# Patient Record
Sex: Female | Born: 1950 | Race: Black or African American | Hispanic: No | Smoking: Former smoker
Health system: Southern US, Community
[De-identification: ages and names within clinical notes are randomized; demographics above are authoritative.]

## PROBLEM LIST (undated history)

## (undated) ENCOUNTER — Emergency Department (HOSPITAL_COMMUNITY): Admission: EM | Payer: BC Managed Care – PPO | Source: Home / Self Care

## (undated) DIAGNOSIS — E785 Hyperlipidemia, unspecified: Secondary | ICD-10-CM

## (undated) DIAGNOSIS — F17200 Nicotine dependence, unspecified, uncomplicated: Secondary | ICD-10-CM

## (undated) DIAGNOSIS — E663 Overweight: Secondary | ICD-10-CM

## (undated) HISTORY — DX: Overweight: E66.3

## (undated) HISTORY — DX: Nicotine dependence, unspecified, uncomplicated: F17.200

---

## 1985-02-16 HISTORY — PX: PARTIAL HYSTERECTOMY: SHX80

## 2008-02-04 ENCOUNTER — Emergency Department (HOSPITAL_COMMUNITY): Admission: EM | Admit: 2008-02-04 | Discharge: 2008-02-04 | Payer: Self-pay | Admitting: Emergency Medicine

## 2009-04-22 ENCOUNTER — Ambulatory Visit: Payer: Self-pay | Admitting: Family Medicine

## 2009-04-22 DIAGNOSIS — J309 Allergic rhinitis, unspecified: Secondary | ICD-10-CM | POA: Insufficient documentation

## 2009-04-22 DIAGNOSIS — R5383 Other fatigue: Secondary | ICD-10-CM

## 2009-04-22 DIAGNOSIS — E663 Overweight: Secondary | ICD-10-CM | POA: Insufficient documentation

## 2009-04-22 DIAGNOSIS — Z87891 Personal history of nicotine dependence: Secondary | ICD-10-CM | POA: Insufficient documentation

## 2009-04-22 DIAGNOSIS — E66811 Obesity, class 1: Secondary | ICD-10-CM | POA: Insufficient documentation

## 2009-04-22 DIAGNOSIS — R5381 Other malaise: Secondary | ICD-10-CM | POA: Insufficient documentation

## 2009-07-23 ENCOUNTER — Encounter: Payer: Self-pay | Admitting: Family Medicine

## 2009-08-01 ENCOUNTER — Telehealth: Payer: Self-pay | Admitting: Family Medicine

## 2009-08-01 LAB — CONVERTED CEMR LAB
Basophils Absolute: 0 10*3/uL (ref 0.0–0.1)
Basophils Relative: 1 % (ref 0–1)
Calcium: 9.7 mg/dL (ref 8.4–10.5)
Cholesterol: 288 mg/dL — ABNORMAL HIGH (ref 0–200)
Glucose, Bld: 90 mg/dL (ref 70–99)
HDL: 47 mg/dL (ref >39)
MCHC: 32.1 g/dL (ref 30.0–36.0)
Neutro Abs: 2.9 10*3/uL (ref 1.7–7.7)
Neutrophils Relative %: 38 % — ABNORMAL LOW (ref 43–77)
Platelets: 334 10*3/uL (ref 150–400)
RDW: 15.4 % (ref 11.5–15.5)
Sodium: 139 meq/L (ref 135–145)
Total CHOL/HDL Ratio: 6.1
Triglycerides: 108 mg/dL (ref <150)
Vit D, 25-Hydroxy: 46 ng/mL (ref 30–89)

## 2009-08-05 ENCOUNTER — Ambulatory Visit: Payer: Self-pay | Admitting: Family Medicine

## 2009-08-05 DIAGNOSIS — E785 Hyperlipidemia, unspecified: Secondary | ICD-10-CM | POA: Insufficient documentation

## 2009-12-05 ENCOUNTER — Ambulatory Visit: Payer: Self-pay | Admitting: Family Medicine

## 2009-12-05 DIAGNOSIS — T7840XA Allergy, unspecified, initial encounter: Secondary | ICD-10-CM | POA: Insufficient documentation

## 2009-12-09 LAB — CONVERTED CEMR LAB
ALT: 30 units/L (ref 0–35)
AST: 29 units/L (ref 0–37)
Albumin: 4 g/dL (ref 3.5–5.2)
HDL: 42 mg/dL (ref >39)
Total Bilirubin: 0.4 mg/dL (ref 0.3–1.2)
Total CHOL/HDL Ratio: 3.4

## 2010-03-18 NOTE — Assessment & Plan Note (Signed)
Summary: office visit   Vital Signs:  Patient profile:   60 year old female Menstrual status:  hysterectomy Height:      65.5 inches Weight:      166.50 pounds BMI:     27.38 O2 Sat:      97 % on Room air Pulse rate:   70 / minute Pulse rhythm:   regular Resp:     16 per minute BP sitting:   122 / 82  (left arm)  Vitals Entered By: Mauricia Area CMA (December 05, 2009 3:25 PM)  Nutrition Counseling: Patient's BMI is greater than 25 and therefore counseled on weight management options.  O2 Flow:  Room air CC: follow up   Primary Care Provider:  Syliva Overman MD  CC:  follow up.  History of Present Illness: 5 day h/o increased head and chest congestion, she denies fever or chills, drainage and sputum are clear. ms [place] still smokes daily and has mde essentially no progress in quitting. pt reports some muscle aches on the lipitor, neither constant nor seere. Reports  that she had been  doing well. Denies recent fever or chills. She has been wporking on dietarychange to improve her cholesterol. Denies chest pain, palpitations, PND, orthopnea or leg swelling. Denies abdominal pain, nausea, vomitting, diarrhea or constipation. Denies change in bowel movements or bloody stool. Denies dysuria , frequency, incontinence or hesitancy. Denies  joint pain, swelling, or reduced mobility. Denies headaches, vertigo, seizures. Denies depression, anxiety or insomnia. Denies  rash, lesions, or itch.     Preventive Screening-Counseling & Management  Alcohol-Tobacco     Smoking Cessation Counseling: yes  Current Medications (verified): 1)  Lipitor 40 Mg Tabs (Atorvastatin Calcium) .... Take 1 Tab By Mouth At Bedtime 2)  Evista 60 Mg Tabs (Raloxifene Hcl) .Marland Kitchen.. 1 Tab By Mouth Daily  Allergies (verified): 1)  ! * Boniva  Review of Systems      See HPI General:  Complains of fatigue and malaise; 5 day hx. Eyes:  Denies blurring and discharge. CV:  Denies chest pain or  discomfort, palpitations, and swelling of feet. GI:  Denies abdominal pain, constipation, diarrhea, nausea, and vomiting. GU:  Denies dysuria and urinary frequency. MS:  Denies joint pain and stiffness. Psych:  Denies anxiety and depression. Endo:  Denies cold intolerance, excessive thirst, excessive urination, and heat intolerance. Heme:  Denies abnormal bruising and bleeding. Allergy:  Complains of seasonal allergies and sneezing; denies hives or rash and itching eyes.  Physical Exam  General:  Well-developed,well-nourished,in no acute distress; alert,appropriate and cooperative throughout examination HEENT: No facial asymmetry,  EOMI, No sinus tenderness, TM's Clear, oropharynx  pink and moist. nasal mucosa erythematous nd edmatous  Chest: decreased aior ntry, few scattered crackles CVS: S1, S2, No murmurs, No S3.   Abd: Soft, Nontender.  MS: Adequate ROM spine, hips, shoulders and knees.  Ext: No edema.   CNS: CN 2-12 intact, power tone and sensation normal throughout.   Skin: Intact, no visible lesions or rashes.  Psych: Good eye contact, normal affect.  Memory intact, not anxious or depressed appearing.    Impression & Recommendations:  Problem # 1:  ALLERGY (ICD-995.3) Assessment Deteriorated prednisone dose pack  Problem # 2:  HYPERLIPIDEMIA (ICD-272.4) Assessment: Improved  The following medications were removed from the medication list:    Lipitor 40 Mg Tabs (Atorvastatin calcium) .Marland Kitchen... Take 1 tab by mouth at bedtime  Orders: T-Lipid Profile [phone]) T-Hepatic Function [phone]) T-Hepatic Function [phone]) T-Lipid Profile [phone])  Problem # 3:  NICOTINE ADDICTION (ICD-305.1) Assessment: Unchanged  Encouraged smoking cessation and discussed different methods for smoking cessation.   Problem # 4:  OVERWEIGHT (ICD-278.02) Assessment: Improved  Ht: 65.5 (12/05/2009)   Wt: 166.50 (12/05/2009)   BMI: 27.38 (12/05/2009)  Complete Medication  List: 1)  Evista 60 Mg Tabs (Raloxifene hcl) .Marland Kitchen.. 1 tab by mouth daily  Patient Instructions: 1)  Please schedule a follow-up appointment in 4 months. 2)  Tobacco is very bad for your health and your loved ones! You Should stop smoking!. 3)  Stop Smoking Tips: Choose a Quit date. Cut down before the Quit date. decide what you will do as a substitute when you feel the urge to smoke(gum,toothpick,exercise).i recommend smoking cessation classes 4)  It is important that you exercise regularly at least 20 minutes 5 times a week. If you develop chest pain, have severe difficulty breathing, or feel very tired , stop exercising immediately and seek medical attention. 5)  You need to lose weight. Consider a lower calorie diet and regular exercise.  6)  Lipid Panel prior to visit, ICD-9:fasting today, hepatic and CPK today 7)  pLS sTOP the lipitor, i will recommend another med in its place 8)  You are being treated for uncontrolled allergies, prednisone dose pack is sent in 9)  pls call back in the next 7 to 10 days if you get worse with fever, chills, green sputum or green drainage, at that time i will prescribe antibiotics Prescriptions: PREDNISONE (PAK) 5 MG TABS (PREDNISONE) Use as directed  #21 x 0   Entered and Authorized by:   Syliva Overman MD   Signed by:   Syliva Overman MD on 12/05/2009   Method used:   Printed then faxed to ...         RxID:   [id number]    Orders Added: 1)  Est. Patient Level IV [42595] 2)  T-Lipid Profile [[id number]] 3)  T-Hepatic Function [[id number]] 4)  T-Hepatic Function [[id number]] 5)  T-Lipid Profile [[id number]]

## 2010-03-18 NOTE — Progress Notes (Signed)
Summary: CALL BACK  Phone Note Call from Patient   Summary of Call: WANTS YOU TO CALL HER BACK SHE IS WORIED TO DEATH WHY YOU WANT TO SEE HER CALL BACK AT [phone] Initial call taken by: Lind Guest,  August 01, 2009 2:45 PM  Follow-up for Phone Call        Advise pt it is to discuss her cholesterol. Follow-up by: Esperanza Sheets PA,  August 01, 2009 5:05 PM  Additional Follow-up for Phone Call Additional follow up Details #1::        Patient aware Additional Follow-up by: Everitt Amber LPN,  August 02, 2009 4:01 PM

## 2010-03-18 NOTE — Assessment & Plan Note (Signed)
Summary: PER DR LABS   Vital Signs:  Patient profile:   60 year old female Menstrual status:  hysterectomy Height:      65.5 inches Weight:      169 pounds BMI:     27.80 O2 Sat:      98 % Pulse rate:   81 / minute Pulse rhythm:   regular Resp:     16 per minute BP sitting:   112 / 70  (left arm) Cuff size:   regular  Vitals Entered By: Everitt Amber LPN (August 05, 2009 3:30 PM)  Nutrition Counseling: Patient's BMI is greater than 25 and therefore counseled on weight management options. CC: Follow up chronic problems, had labs done.    Primary Care Provider:  Syliva Overman MD  CC:  Follow up chronic problems and had labs done. Marland Kitchen  History of Present Illness: Reports  that she has been doing well. She is disappointed in her inability to stop smoking a well as to lose weight , but still wants to work on both of these even harder. She has been called in earlier than ecpected because of labs which show marked hyperlipidemia. Review of her diet, though not perfect, suggests that dietary mx alone will not address this issue, and she ahs been convinced to start meds also Denies recent fever or chills. Denies sinus pressure, nasal congestion , ear pain or sore throat. Denies chest congestion, or cough productive of sputum. Denies chest pain, palpitations, PND, orthopnea or leg swelling. Denies abdominal pain, nausea, vomitting, diarrhea or constipation. Denies change in bowel movements or bloody stool. Denies dysuria , frequency, incontinence or hesitancy. Denies  joint pain, swelling, or reduced mobility. Denies headaches, vertigo, seizures. Denies depression, anxiety or insomnia. Denies  rash, lesions, or itch.     Preventive Screening-Counseling & Management  Alcohol-Tobacco     Smoking Cessation Counseling: yes  Current Medications (verified): 1)  Oscal 500/200 D-3 500-200 Mg-Unit Tabs (Calcium-Vitamin D) .... Take 1 Tablet By Mouth Three Times A Day  Allergies  (verified): 1)  ! * Boniva  Review of Systems      See HPI Eyes:  Denies discharge and red eye. Endo:  Denies cold intolerance, excessive thirst, excessive urination, and heat intolerance. Heme:  Denies abnormal bruising and bleeding. Allergy:  Denies hives or rash and itching eyes.  Physical Exam  General:  Well-developed,well-nourished,in no acute distress; alert,appropriate and cooperative throughout examination HEENT: No facial asymmetry,  EOMI, No sinus tenderness, TM's Clear, oropharynx  pink and moist.   Chest: Clear to auscultation bilaterally.  CVS: S1, S2, No murmurs, No S3.   Abd: Soft, Nontender.  MS: Adequate ROM spine, hips, shoulders and knees.  Ext: No edema.   CNS: CN 2-12 intact, power tone and sensation normal throughout.   Skin: Intact, no visible lesions or rashes.  Psych: Good eye contact, normal affect.  Memory intact, not anxious or depressed appearing.    Impression & Recommendations:  Problem # 1:  HYPERLIPIDEMIA (ICD-272.4) Assessment Comment Only  Her updated medication list for this problem includes:    Lipitor 40 Mg Tabs (Atorvastatin calcium) .Marland Kitchen... Take 1 tab by mouth at bedtime  Orders: T-Hepatic Function [phone]) T-Lipid Profile ([id number])    HDL:47 (07/23/2009)  LDL:219 (07/23/2009)  Chol:288 (07/23/2009)  Trig:108 (07/23/2009), low fat diwet couinselling done and info provided  Problem # 2:  OVERWEIGHT (ICD-278.02) Assessment: Unchanged  Ht: 65.5 (08/05/2009)   Wt: 169 (08/05/2009)   BMI: 27.80 (08/05/2009)pt encouragwed to reduce  calories and inc exercisie   Problem # 3:  NICOTINE ADDICTION (ICD-305.1) Assessment: Unchanged  The following medications were removed from the medication list:    Chantix Starting Month Pak 0.5 Mg X 11 & 1 Mg X 42 Tabs (Varenicline tartrate) ..... Uad    Chantix Continuing Month Pak 1 Mg Tabs (Varenicline tartrate) .Marland Kitchen... Take 1 tablet by mouth two times a day x 12 weeks  Encouraged smoking  cessation and discussed different methods for smoking cessation.   Complete Medication List: 1)  Lipitor 40 Mg Tabs (Atorvastatin calcium) .... Take 1 tab by mouth at bedtime  Patient Instructions: 1)  Please schedule a follow-up appointment in 4 months. 2)  Tobacco is very bad for your health and your loved ones! You Should stop smoking!. 3)  Stop Smoking Tips: Choose a Quit date. Cut down before the Quit date. decide what you will do as a substitute when you feel the urge to smoke(gum,toothpick,exercise). 4)  It is important that you exercise regularly at least 20 minutes 5 times a week. If you develop chest pain, have severe difficulty breathing, or feel very tired , stop exercising immediately and seek medical attention. 5)  You need to lose weight. Consider a lower calorie diet and regular exercise.  6)  Your cholesterol is too high, you need to change your diet and start med for this. 7)  pls cut down on nicotine use to reduce the risk of heart disease. 8)  Hepatic Panel prior to visit, ICD-9: 9)  Lipid Panel prior to visit, ICD-9: 10)  It is impt for you take calcium with d 1200mg /1000iU one daily Prescriptions: LIPITOR 40 MG TABS (ATORVASTATIN CALCIUM) Take 1 tab by mouth at bedtime  #30 x 3   Entered and Authorized by:   Syliva Overman MD   Signed by:   Syliva Overman MD on 08/05/2009   Method used:   Print then Give to Patient   RxID:   [id number]

## 2010-03-18 NOTE — Assessment & Plan Note (Signed)
Summary: NEW PATIENT   Vital Signs:  Patient profile:   60 year old female Menstrual status:  hysterectomy Height:      65.5 inches Weight:      169.75 pounds BMI:     27.92 O2 Sat:      96 % Pulse rate:   70 / minute Pulse rhythm:   regular Resp:     16 per minute BP sitting:   120 / 80  (left arm) Cuff size:   regular  Vitals Entered By: Everitt Amber LPN (April 22, 1608 1:38 PM)  Nutrition Counseling: Patient's BMI is greater than 25 and therefore counseled on weight management options. CC: New patient     Menstrual Status hysterectomy   Primary Care Provider:  Syliva Overman MD  CC:  New patient.  History of Present Illness: pt in to establish care.  She is a generally health middle aged fmale who appears younger than her stated age. She does smoke and recognises the need to and wants to stop. She also is aware that she is mildly overweight , though most of the excess is in her breast, she is however willing to commit to healthier eating habits and regular exercise to facilitate weight losss and improved health. Reports  that she is generally doing well. Denies recent fever or chills. Denies sinus pressure, nasal congestion , ear pain or sore throat. Denies chest congestion, or cough productive of sputum. Denies chest pain, palpitations, PND, orthopnea or leg swelling. Denies abdominal pain, nausea, vomitting, diarrhea or constipation. Denies change in bowel movements or bloody stool. Denies dysuria , frequency, incontinence or hesitancy. Denies  joint pain, swelling, or reduced mobility.She does c/o intermittent shooting leg pain for the past 2 to 3 months just distal to the knee, no aggravating or relieving factors noted. she denies lower extremity weakness or numbness. Denies headaches, vertigo, seizures. Denies depression, anxiety or insomnia. c/o lesion which is painless over right eye     Preventive Screening-Counseling & Management  Alcohol-Tobacco  Smoking Status: current     Smoking Cessation Counseling: yes  Caffeine-Diet-Exercise     Does Patient Exercise: yes      Drug Use:  no.    Current Medications (verified): 1)  None  Allergies (verified): 1)  ! Sandrea Hammond  Past History:  Past Medical History: nicotine dependence overweight  Past Surgical History: 1987 -Partial Hysterectomy , had one ovary removed  Family History: Mother-living-HTN Father-Deceased-2003-Emphsema (didn't know much about him) 1-Sister-HTN Brother (4)-Healthy as far as she knows. Oldest brother diabetic  Social History: Occupation: [employer] operational officer) Married, no children Current Smoker-  10 to 15 per day, plans to quit  Alcohol use-no Drug use-no Regular exercise-yes, on avg 3 days  per week Smoking Status:  current Drug Use:  no Does Patient Exercise:  yes  Review of Systems      See HPI Eyes:  Denies blurring and discharge. Derm:  Complains of lesion(s); hyperpigmented papular lesions in right eyebrow where she had been treated for a staph infection over 1 year ago. Neuro:  See HPI. Psych:  See HPI. Endo:  Denies cold intolerance, excessive hunger, excessive thirst, excessive urination, heat intolerance, polyuria, and weight change. Heme:  Denies abnormal bruising and bleeding. Allergy:  Complains of itching eyes; yeasr round allergies uses otc meds eg claritin with relief.  Physical Exam  General:  Well-developed,well-nourished,in no acute distress; alert,appropriate and cooperative throughout examination HEENT: No facial asymmetry,  EOMI, No sinus tenderness, TM's Clear,  oropharynx  pink and moist.   Chest: Clear to auscultation bilaterally.  CVS: S1, S2, No murmurs, No S3.   Abd: Soft, Nontender.  MS: Adequate ROM spine, hips, shoulders and knees.  Ext: No edema.   CNS: CN 2-12 intact, power tone and sensation normal throughout.   Skin: Intact, no visible lesions or rashes.  Psych: Good eye contact, normal affect.   Memory intact, not anxious or depressed appearing.    Impression & Recommendations:  Problem # 1:  OVERWEIGHT (ICD-278.02) Assessment Comment Only pt commiting to lifestyle changes to facilitATE WEIGHT LOSS AND IMPROVED HEALTH  Problem # 2:  NICOTINE ADDICTION (ICD-305.1) Assessment: Comment Only  Her updated medication list for this problem includes:    Chantix Starting Month Pak 0.5 Mg X 11 & 1 Mg X 42 Tabs (Varenicline tartrate) ..... Uad    Chantix Continuing Month Pak 1 Mg Tabs (Varenicline tartrate) .Marland Kitchen... Take 1 tablet by mouth two times a day x 12 weeks  Encouraged smoking cessation and discussed different methods for smoking cessation.   Complete Medication List: 1)  Oscal 500/200 D-3 500-200 Mg-unit Tabs (Calcium-vitamin d) .... Take 1 tablet by mouth three times a day 2)  Chantix Starting Month Pak 0.5 Mg X 11 & 1 Mg X 42 Tabs (Varenicline tartrate) .... Uad 3)  Chantix Continuing Month Pak 1 Mg Tabs (Varenicline tartrate) .... Take 1 tablet by mouth two times a day x 12 weeks  Other Orders: T-Basic Metabolic Panel [phone]) T-Lipid Profile [phone]) T-CBC w/Diff [id number]) T-TSH [phone]) T-Vitamin D (25-Hydroxy) [phone])  Patient Instructions: 1)  Please schedule a follow-up appointment in 4 months. 2)  Tobacco is very bad for your health and your loved ones! You Should stop smoking!. 3)  Stop Smoking Tips: Choose a Quit date. Cut down before the Quit date. decide what you will do as a substitute when you feel the urge to smoke(gum,toothpick,exercise). 4)  It is important that you exercise regularly at least 20 minutes 5 times a week. If you develop chest pain, have severe difficulty breathing, or feel very tired , stop exercising immediately and seek medical attention. 5)  You need to lose weight. Consider a lower calorie diet and regular exercise.  6)  BMP prior to visit, ICD-9: 7)  Lipid Panel prior to visit, ICD-9: 8)  TSH prior to visit,  ICD-9:  fasting asap 9)  CBC w/ Diff prior to visit, ICD-9: 10)  Vitamiin D  11)  As far as the pain in the left leg is concerned if it worsens pls call back before any tests or referrals are made. 12)  I recommend an eye evaluation and mention to the eye doc your concerns about intermittent pain. 13)  Pls  plan top quit smoking, and commit  to 5 days of exercise, also attempt to lose 10 pounds in the nect 4  months   Prescriptions: CHANTIX CONTINUING MONTH PAK 1 MG TABS (VARENICLINE TARTRATE) Take 1 tablet by mouth two times a day x 12 weeks  #12wks x 0   Entered by:   Everitt Amber LPN   Authorized by:   Syliva Overman MD   Signed by:   Everitt Amber LPN on 56/38/7564   Method used:   Handwritten   RxID:   [id number] CHANTIX STARTING MONTH PAK 0.5 MG X 11 & 1 MG X 42 TABS (VARENICLINE TARTRATE) UAD  #1 x 0   Entered by:   Everitt Amber LPN   Authorized by:  Syliva Overman MD   Signed by:   Everitt Amber LPN on 91/47/8295   Method used:   Print then Give to Patient   RxID:   [id number] OSCAL 500/200 D-3 500-200 MG-UNIT TABS (CALCIUM-VITAMIN D) Take 1 tablet by mouth three times a day  #90 x 11   Entered by:   Everitt Amber LPN   Authorized by:   Syliva Overman MD   Signed by:   Everitt Amber LPN on 52/84/1324   Method used:   Print then Give to Patient   RxID:   [id number]

## 2010-04-10 ENCOUNTER — Encounter (HOSPITAL_COMMUNITY): Payer: Self-pay

## 2010-04-10 ENCOUNTER — Encounter: Payer: Self-pay | Admitting: Family Medicine

## 2010-04-10 ENCOUNTER — Ambulatory Visit (INDEPENDENT_AMBULATORY_CARE_PROVIDER_SITE_OTHER): Payer: BC Managed Care – PPO | Admitting: Family Medicine

## 2010-04-10 ENCOUNTER — Ambulatory Visit (HOSPITAL_COMMUNITY)
Admission: RE | Admit: 2010-04-10 | Discharge: 2010-04-10 | Disposition: A | Discharge status: Home / Self Care | Payer: BC Managed Care – PPO | Source: Ambulatory Visit | Attending: Family Medicine | Admitting: Family Medicine

## 2010-04-10 ENCOUNTER — Other Ambulatory Visit: Payer: Self-pay | Admitting: Family Medicine

## 2010-04-10 DIAGNOSIS — R0989 Other specified symptoms and signs involving the circulatory and respiratory systems: Secondary | ICD-10-CM | POA: Insufficient documentation

## 2010-04-10 DIAGNOSIS — J209 Acute bronchitis, unspecified: Secondary | ICD-10-CM | POA: Insufficient documentation

## 2010-04-10 DIAGNOSIS — R059 Cough, unspecified: Secondary | ICD-10-CM | POA: Insufficient documentation

## 2010-04-10 DIAGNOSIS — E785 Hyperlipidemia, unspecified: Secondary | ICD-10-CM

## 2010-04-10 DIAGNOSIS — R05 Cough: Secondary | ICD-10-CM | POA: Insufficient documentation

## 2010-04-10 DIAGNOSIS — J42 Unspecified chronic bronchitis: Secondary | ICD-10-CM | POA: Insufficient documentation

## 2010-04-10 DIAGNOSIS — T7840XA Allergy, unspecified, initial encounter: Secondary | ICD-10-CM

## 2010-04-24 NOTE — Assessment & Plan Note (Signed)
Summary: F UP   Vital Signs:  Patient profile:   60 year old female Menstrual status:  hysterectomy Height:      65.5 inches Weight:      172.25 pounds BMI:     28.33 O2 Sat:      98 % Pulse rate:   71 / minute Pulse rhythm:   regular Resp:     16 per minute BP sitting:   110 / 76  (left arm) Cuff size:   regular  Vitals Entered By: Everitt Amber LPN (April 10, 2010 10:17 AM)  Nutrition Counseling: Patient's BMI is greater than 25 and therefore counseled on weight management options. CC: c/o sinus drainage, coughing alot, bringing up clear phlegm, itchy eyes, some sneezing     Primary Care Provider:  Syliva Overman MD  CC:  c/o sinus drainage, coughing alot, bringing up clear phlegm, itchy eyes, and some sneezing  .  History of Present Illness: 1 week h/o increased and uncontrolled allergy symptoms, clear nasal drainage also chest congesrtion with excessive cough   Denies chest pain, palpitations, PND, orthopnea or leg swelling. Denies abdominal pain, nausea, vomitting, diarrhea or constipation. Denies change in bowel movements or bloody stool. Denies dysuria , frequency, incontinence or hesitancy. Denies  joint pain, swelling, or reduced mobility. Denies headaches, vertigo, seizures. Denies depression, anxiety or insomnia. Denies  rash, lesions, or itch.     Preventive Screening-Counseling & Management  Alcohol-Tobacco     Smoking Cessation Counseling: yes  Current Medications (verified): 1)  None  Allergies (verified): 1)  ! * Boniva  Review of Systems      See HPI General:  Complains of chills, fatigue, malaise, and weakness. Eyes:  Denies discharge and eye pain. ENT:  Complains of earache, hoarseness, nasal congestion, postnasal drainage, and sinus pressure. CV:  Denies chest pain or discomfort and palpitations. Resp:  Complains of cough, shortness of breath, sputum productive, and wheezing. Derm:  Denies itching and rash. Neuro:  Complains of  headaches; denies poor balance, seizures, sensation of room spinning, and tingling; increased headache with allergy symptoms,over cheeckbones. Psych:  Denies anxiety and depression. Endo:  Denies cold intolerance, excessive hunger, excessive thirst, and excessive urination. Heme:  Denies abnormal bruising, bleeding, enlarge lymph nodes, and fevers. Allergy:  Complains of persistent infections, seasonal allergies, and sneezing; increased and uncontrolled allwergy symptoms x 1 week.  Physical Exam  General:  Well-developed,well-nourished,in no acute distress; alert,appropriate and cooperative throughout examinationill appearing HEENT: No facial asymmetry,  EOMI, maxillary  sinus tenderness, TM's Clear, oropharynx  pink and moist. Nasal mucosa erythematous and edematous  Chest: decreased air entry, scattered crackles and wheezes CVS: S1, S2, No murmurs, No S3.   Abd: Soft, Nontender.  MS: Adequate ROM spine, hips, shoulders and knees.  Ext: No edema.   CNS: CN 2-12 intact, power tone and sensation normal throughout.   Skin: Intact, no visible lesions or rashes.  Psych: Good eye contact, normal affect.  Memory intact, not anxious or depressed appearing.    Impression & Recommendations:  Problem # 1:  ACUTE BRONCHITIS (ICD-466.0) Assessment Comment Only  Her updated medication list for this problem includes:    Septra Ds 800-160 Mg Tabs (Sulfamethoxazole-trimethoprim) .Marland Kitchen... Take 1 tablet by mouth two times a day    Tessalon Perles 100 Mg Caps (Benzonatate) .Marland Kitchen... Take 1 tablet by mouth three times a day  Orders: CXR- 2view (CXR) Rocephin  250mg  (E4540) Depo- Medrol 80mg  (J1040) Admin of Therapeutic Inj  intramuscular or subcutaneous (98119)  Albuterol Sulfate Sol 1mg  unit dose (U0454) Atrovent 1mg  (Neb) (U9811) Nebulizer Tx (91478)  Problem # 2:  ALLERGY (ICD-995.3) Assessment: Deteriorated depomedrol administered and a prednisone dose pack prescribed  Problem # 3:  NICOTINE  ADDICTION (ICD-305.1) Assessment: Unchanged  Encouraged smoking cessation and discussed different methods for smoking cessation.   Problem # 4:  HYPERLIPIDEMIA (ICD-272.4) Assessment: Comment Only  Orders: T-Lipid Profile (29562-13086) Low fat dietdiscussed and encouraged  Labs Reviewed: SGOT: 29 (12/06/2009)   SGPT: 30 (12/06/2009)   HDL:42 (12/06/2009), 47 (07/23/2009)  LDL:82 (12/06/2009), 219 (57/84/6962)  Chol:143 (12/06/2009), 288 (07/23/2009)  Trig:93 (12/06/2009), 108 (07/23/2009)  Complete Medication List: 1)  Flonase 50 Mcg/act Susp (Fluticasone propionate) .... 2 puffs per nostril daily 2)  Septra Ds 800-160 Mg Tabs (Sulfamethoxazole-trimethoprim) .... Take 1 tablet by mouth two times a day 3)  Tessalon Perles 100 Mg Caps (Benzonatate) .... Take 1 tablet by mouth three times a day 4)  Loratadine 10 Mg Tabs (Loratadine) .... Take 1 tablet by mouth once a day 5)  Prednisone (pak) 5 Mg Tabs (Prednisone) .... Uad x 6 days  Patient Instructions: 1)  Please schedule a follow-up appointment in 4.5 months. 2)  Tobacco is very bad for your health and your loved ones! You Should stop smoking!. 3)  Stop Smoking Tips: Choose a Quit date. Cut down before the Quit date. decide what you will do as a substitute when you feel the urge to smoke(gum,toothpick,exercise). 4)  It is important that you exercise regularly at least 20 minutes 5 times a week. If you develop chest pain, have severe difficulty breathing, or feel very tired , stop exercising immediately and seek medical attention. 5)  You need to lose weight. Consider a lower calorie diet and regular exercise.  6)  you will be started on  7)  lORATIDINE 10 mg one daily. 8)  Fluticasone /flonase nasal spray 1 to 2 puffs per nostril once daily. 9)  if you have a flare up of allergy symptoms, oK to take sudafed one to two tablets once daily. 10)  Today you are being prescribed a course of antibiotics, for 10 days and a 6 day course of  prednisone.ASlso decongestant tablets.You will get a breathing treatment in the office, as well as rocephin 500mg  Im and depomedrol 80 mg IM 11)  Normal saline  nasal/sinus flushes 2 to 3 times daily. 12)  CXR today, and fasting lipids pls   Medication Administration  Injection # 1:    Medication: Rocephin  250mg     Diagnosis: ACUTE BRONCHITIS (ICD-466.0)    Route: IM    Site: RUOQ gluteus    Exp Date: 09/2011    Lot #: [id number]    Mfr: novaplus    Comments: 500mg  given     Patient tolerated injection without complications    Given by: Everitt Amber LPN (April 10, 2010 11:57 AM)  Injection # 2:    Medication: Depo- Medrol 80mg     Diagnosis: ACUTE BRONCHITIS (ICD-466.0)    Route: IM    Site: LUOQ gluteus    Exp Date: 12/2010    Lot #: [id number]    Mfr: Pharmacia    Comments: 80mg  given     Patient tolerated injection without complications    Given by: Everitt Amber LPN (April 10, 2010 11:58 AM)  Medication # 1:    Medication: Albuterol Sulfate Sol 1mg  unit dose    Diagnosis: ACUTE BRONCHITIS (ICD-466.0)    Dose: 2.5/32ml    Route: inhaled  Exp Date: 8/12    Lot #: [id number]    Mfr: nephron     Patient tolerated medication without complications    Given by: Everitt Amber LPN (April 10, 2010 2:16 PM)  Medication # 2:    Medication: Atrovent 1mg  (Neb)    Diagnosis: ACUTE BRONCHITIS (ICD-466.0)    Dose: 0.5/2.59ml    Route: inhaled    Exp Date: 10/12    Lot #: [id number]    Mfr: nephron     Patient tolerated medication without complications    Given by: Everitt Amber LPN (April 10, 2010 2:17 PM)  Orders Added: 1)  Est. Patient Level IV [04540] 2)  CXR- 2view [CXR] 3)  T-Lipid Profile [80061-22930] 4)  Rocephin  250mg  [J0696] 5)  Depo- Medrol 80mg  [J1040] 6)  Admin of Therapeutic Inj  intramuscular or subcutaneous [96372] 7)  Albuterol Sulfate Sol 1mg  unit dose [J7613] 8)  Atrovent 1mg  (Neb) [J8119] 9)  Nebulizer Tx [14782]     Medication Administration  Injection  # 1:    Medication: Rocephin  250mg     Diagnosis: ACUTE BRONCHITIS (ICD-466.0)    Route: IM    Site: RUOQ gluteus    Exp Date: 09/2011    Lot #: [id number]    Mfr: novaplus    Comments: 500mg  given     Patient tolerated injection without complications    Given by: Everitt Amber LPN (April 10, 2010 11:57 AM)  Injection # 2:    Medication: Depo- Medrol 80mg     Diagnosis: ACUTE BRONCHITIS (ICD-466.0)    Route: IM    Site: LUOQ gluteus    Exp Date: 12/2010    Lot #: [id number]    Mfr: Pharmacia    Comments: 80mg  given     Patient tolerated injection without complications    Given by: Everitt Amber LPN (April 10, 2010 11:58 AM)  Medication # 1:    Medication: Albuterol Sulfate Sol 1mg  unit dose    Diagnosis: ACUTE BRONCHITIS (ICD-466.0)    Dose: 2.5/9ml    Route: inhaled    Exp Date: 8/12    Lot #: [id number]    Mfr: nephron     Patient tolerated medication without complications    Given by: Everitt Amber LPN (April 10, 2010 2:16 PM)  Medication # 2:    Medication: Atrovent 1mg  (Neb)    Diagnosis: ACUTE BRONCHITIS (ICD-466.0)    Dose: 0.5/2.3ml    Route: inhaled    Exp Date: 10/12    Lot #: [id number]    Mfr: nephron     Patient tolerated medication without complications    Given by: Everitt Amber LPN (April 10, 2010 2:17 PM)  Orders Added: 1)  Est. Patient Level IV [52841] 2)  CXR- 2view [CXR] 3)  T-Lipid Profile [80061-22930] 4)  Rocephin  250mg  [J0696] 5)  Depo- Medrol 80mg  [J1040] 6)  Admin of Therapeutic Inj  intramuscular or subcutaneous [96372] 7)  Albuterol Sulfate Sol 1mg  unit dose [J7613] 8)  Atrovent 1mg  (Neb) [L2440] 9)  Nebulizer Tx [10272]

## 2010-05-02 ENCOUNTER — Encounter: Payer: Self-pay | Admitting: Family Medicine

## 2010-05-06 NOTE — Miscellaneous (Signed)
Summary: Orders Update  Clinical Lists Changes  Orders: Added new Test order of T-Basic Metabolic Panel [phone]) - Signed Added new Test order of T-Lipid Profile [phone]) - Signed

## 2010-06-18 ENCOUNTER — Emergency Department (HOSPITAL_COMMUNITY)
Admission: EM | Admit: 2010-06-18 | Discharge: 2010-06-18 | Disposition: A | Discharge status: Home / Self Care | Payer: BC Managed Care – PPO | Attending: Emergency Medicine | Admitting: Emergency Medicine

## 2010-06-18 ENCOUNTER — Telehealth: Payer: Self-pay | Admitting: Family Medicine

## 2010-06-18 DIAGNOSIS — Z79899 Other long term (current) drug therapy: Secondary | ICD-10-CM | POA: Insufficient documentation

## 2010-06-18 DIAGNOSIS — R42 Dizziness and giddiness: Secondary | ICD-10-CM | POA: Insufficient documentation

## 2010-06-18 NOTE — Telephone Encounter (Signed)
Patient states she is very lightheaded and its getting worse and she feels like she is going to fall out. I advised ER

## 2010-09-01 ENCOUNTER — Encounter: Payer: Self-pay | Admitting: Family Medicine

## 2010-09-08 ENCOUNTER — Ambulatory Visit (INDEPENDENT_AMBULATORY_CARE_PROVIDER_SITE_OTHER): Payer: BC Managed Care – PPO | Admitting: Family Medicine

## 2010-09-08 ENCOUNTER — Encounter: Payer: Self-pay | Admitting: Family Medicine

## 2010-09-08 VITALS — BP 118/74 | HR 89 | Resp 16 | Ht 66.0 in | Wt 167.4 lb

## 2010-09-08 DIAGNOSIS — F32A Depression, unspecified: Secondary | ICD-10-CM | POA: Insufficient documentation

## 2010-09-08 DIAGNOSIS — F17200 Nicotine dependence, unspecified, uncomplicated: Secondary | ICD-10-CM

## 2010-09-08 DIAGNOSIS — F329 Major depressive disorder, single episode, unspecified: Secondary | ICD-10-CM

## 2010-09-08 DIAGNOSIS — E785 Hyperlipidemia, unspecified: Secondary | ICD-10-CM

## 2010-09-08 DIAGNOSIS — R079 Chest pain, unspecified: Secondary | ICD-10-CM

## 2010-09-08 DIAGNOSIS — Z23 Encounter for immunization: Secondary | ICD-10-CM

## 2010-09-08 DIAGNOSIS — R42 Dizziness and giddiness: Secondary | ICD-10-CM | POA: Insufficient documentation

## 2010-09-08 MED ORDER — MECLIZINE HCL 25 MG PO TABS: 25.0000 mg | ORAL_TABLET | Freq: Four times a day (QID) | ORAL | Status: AC | PRN

## 2010-09-08 MED ORDER — FLUOXETINE HCL 10 MG PO CAPS: 10.0000 mg | ORAL_CAPSULE | Freq: Every day | ORAL | Status: DC

## 2010-09-08 NOTE — Progress Notes (Signed)
  Subjective:    Patient ID: Kristin Blackwell, female    DOB: July 25, 1950, 60 y.o.   MRN: [id number]  HPI Pt seen in the ED on May 2, for vertigo, discontent with the cost of the bil, and generally not happy. Further questioning reveals her to be very depressed and frustrated over the years, unhappy at home, wants to leave, unable to afford this, feeling of hoplesness. Still smoking, more than before, unable to establish quit routine now due to poor mental health C/o left leg pani , with no associated trauma.Resolving   Review of Systems Denies recent fever or chills. Denies sinus pressure, nasal congestion, ear pain or sore throat. Denies chest congestion, productive cough or wheezing. Denies chest pains, palpitations and leg swelling Denies abdominal pain, nausea, vomiting,diarrhea or constipation.   Denies dysuria, frequency, hesitancy or incontinence. Denies headaches, seizures, numbness, or tingling.  Denies skin break down or rash.        Objective:   Physical Exam Patient alert and oriented and in no cardiopulmonary distress.  HEENT: No facial asymmetry, EOMI, no sinus tenderness,  oropharynx pink and moist.  Neck supple no adenopathy.  Chest: Clear to auscultation bilaterally.  CVS: S1, S2 no murmurs, no S3.  ABD: Soft non tender. Bowel sounds normal.  Ext: No edema  MS: Adequate ROM spine, shoulders, hips and knees.  Skin: Intact, no ulcerations or rash noted.  Psych: Poor  eye contact,at times, flat affect. Memory intact at times , tearful and  depressed appearing.  CNS: CN 2-12 intact, power, tone and sensation normal throughout.        Assessment & Plan:

## 2010-09-08 NOTE — Assessment & Plan Note (Signed)
Pt seen in the Ed on May 2 diagnosed with vertigo. Was prescribed meclizine , had 50 tabs , still has

## 2010-09-08 NOTE — Patient Instructions (Signed)
F/u in 2 months  Please start daily exercise for 30 minutes every day  You are being referred for therapy and new medication is sent for depression also.  Pls get fasting lipids asap   Call 2 days after for result

## 2010-09-14 NOTE — Assessment & Plan Note (Signed)
Deteriorated, although wants to quit , does not feel that it is possible at this time

## 2010-09-14 NOTE — Assessment & Plan Note (Signed)
Uncontrolled, progressing over time, not acitvely suicidal or homicidal. Feeling of hopelesness, has been in therapy in the past , willing to take both medication , and to go for therapy

## 2010-09-14 NOTE — Assessment & Plan Note (Signed)
Not currently on medication, needs updated labs

## 2010-09-16 ENCOUNTER — Encounter: Payer: Self-pay | Admitting: Family Medicine

## 2010-11-07 ENCOUNTER — Encounter: Payer: Self-pay | Admitting: Family Medicine

## 2010-11-10 ENCOUNTER — Encounter: Payer: Self-pay | Admitting: Family Medicine

## 2010-11-10 ENCOUNTER — Ambulatory Visit: Payer: BC Managed Care – PPO | Admitting: Family Medicine

## 2011-05-21 ENCOUNTER — Telehealth: Payer: Self-pay | Admitting: Family Medicine

## 2011-05-21 NOTE — Telephone Encounter (Signed)
Called and left message for pt to return call.  

## 2011-05-21 NOTE — Telephone Encounter (Signed)
Spoke with pt and informed her that she needs an appointment before antibiotics can be given.

## 2011-05-22 ENCOUNTER — Ambulatory Visit (INDEPENDENT_AMBULATORY_CARE_PROVIDER_SITE_OTHER): Payer: BC Managed Care – PPO | Admitting: Family Medicine

## 2011-05-22 ENCOUNTER — Encounter: Payer: Self-pay | Admitting: Family Medicine

## 2011-05-22 ENCOUNTER — Other Ambulatory Visit: Payer: Self-pay | Admitting: Family Medicine

## 2011-05-22 VITALS — BP 110/74 | HR 81 | Temp 99.1°F | Resp 16 | Ht 66.0 in | Wt 170.4 lb

## 2011-05-22 DIAGNOSIS — J209 Acute bronchitis, unspecified: Secondary | ICD-10-CM

## 2011-05-22 DIAGNOSIS — Z720 Tobacco use: Secondary | ICD-10-CM

## 2011-05-22 DIAGNOSIS — J040 Acute laryngitis: Secondary | ICD-10-CM

## 2011-05-22 DIAGNOSIS — F17200 Nicotine dependence, unspecified, uncomplicated: Secondary | ICD-10-CM

## 2011-05-22 MED ORDER — AZITHROMYCIN 250 MG PO TABS: ORAL_TABLET | ORAL | Status: AC

## 2011-05-22 MED ORDER — ALBUTEROL SULFATE HFA 108 (90 BASE) MCG/ACT IN AERS: 2.0000 | INHALATION_SPRAY | RESPIRATORY_TRACT | Status: DC | PRN

## 2011-05-22 NOTE — Patient Instructions (Signed)
Take the antibiotics for your bronchitis Continue to work on the smoking Try tea with honey for the laryngitis Push fluids F/U as previously scheduled with Dr.Simpson

## 2011-05-23 LAB — LIPID PANEL: LDL Cholesterol: 166 mg/dL — ABNORMAL HIGH (ref 0–99)

## 2011-05-24 NOTE — Progress Notes (Signed)
  Subjective:    Patient ID: Kristin Blackwell, female    DOB: September 09, 1950, 61 y.o.   MRN: [id number]  HPI  Cough with production x 1 week, +smoker, has chest tightness with cough and wheezing mainly at night and in the morning, has tried OTC meds for sinus and alkaseltzer without relief.  ROS-+ fever, +wheeze+cough,+sore throat denies CP, N/V, diarrhea, muscle aches, HA  Review of Systems - per above     Objective:   Physical Exam GEN- NAD, alert and oriented x3, very hoarse voice HEENT- PERRL, EOMI, non injected sclera, pink conjunctiva, MMM, oropharynx clear, TM clear bilat Neck- Supple, no LAD CVS- RRR, no murmur RESP-CTAB, no wheeze, upper airway congestion, harsh cough EXT- No edema Pulses- Radial, DP- 2+        Assessment & Plan:   Acute bronchitis- discussed importance of tobacco cessation, treat with antibiotics and inhaler for wheeze.  Hoarse/laryngitis- supportive care Tobacco user- advised cessation

## 2011-11-05 ENCOUNTER — Telehealth: Payer: Self-pay | Admitting: Family Medicine

## 2011-11-05 NOTE — Telephone Encounter (Signed)
Faxed to her 

## 2012-01-05 ENCOUNTER — Encounter: Payer: Self-pay | Admitting: Family Medicine

## 2012-01-05 ENCOUNTER — Ambulatory Visit (INDEPENDENT_AMBULATORY_CARE_PROVIDER_SITE_OTHER): Payer: BC Managed Care – PPO | Admitting: Family Medicine

## 2012-01-05 VITALS — BP 122/84 | HR 79 | Resp 15 | Ht 66.0 in | Wt 170.0 lb

## 2012-01-05 DIAGNOSIS — E663 Overweight: Secondary | ICD-10-CM

## 2012-01-05 DIAGNOSIS — R7301 Impaired fasting glucose: Secondary | ICD-10-CM

## 2012-01-05 DIAGNOSIS — I447 Left bundle-branch block, unspecified: Secondary | ICD-10-CM | POA: Insufficient documentation

## 2012-01-05 DIAGNOSIS — M79609 Pain in unspecified limb: Secondary | ICD-10-CM

## 2012-01-05 DIAGNOSIS — R5381 Other malaise: Secondary | ICD-10-CM

## 2012-01-05 DIAGNOSIS — M79671 Pain in right foot: Secondary | ICD-10-CM

## 2012-01-05 DIAGNOSIS — R9431 Abnormal electrocardiogram [ECG] [EKG]: Secondary | ICD-10-CM

## 2012-01-05 DIAGNOSIS — R0789 Other chest pain: Secondary | ICD-10-CM

## 2012-01-05 DIAGNOSIS — M79672 Pain in left foot: Secondary | ICD-10-CM

## 2012-01-05 DIAGNOSIS — F17200 Nicotine dependence, unspecified, uncomplicated: Secondary | ICD-10-CM

## 2012-01-05 DIAGNOSIS — E785 Hyperlipidemia, unspecified: Secondary | ICD-10-CM

## 2012-01-05 MED ORDER — BUPROPION HCL ER (SR) 150 MG PO TB12: 150.0000 mg | ORAL_TABLET | Freq: Two times a day (BID) | ORAL | Status: DC

## 2012-01-05 NOTE — Progress Notes (Signed)
  Subjective:    Patient ID: Kristin Blackwell, female    DOB: 1950-03-28, 61 y.o.   MRN: [id number]  HPI The PT is here for follow up and re-evaluation of chronic medical conditions, medication management and review of any available recent lab and radiology data.  Preventive health is updated, specifically  Cancer screening and Immunization.  States she had pelvic exam and mammogram earlier this year, and has had a colonoscopy in the past 10 years, will try to obtain documentation of the colonoscopy C/o  Left middle toe pain which she noted after wearing specific shoes, does have bilateral bunions , corns and toe crowding , all of which point to the need to having "good footwear" Still smoking and states she knows she "needs" to quit as her health insurance will go up if she continues to , wants to go to class as well as is willing to take med to help her to  Quit. C/o intermittent left chest pain radiating to axillary area, for several months. Risk factors for heart disease are nicotine use and hyperlipidemia,does not recall EKG in the last 3 to 5 years. Pain has no specific aggravating and relieving factors, denies any associated nausea or diaphoresis     Review of Systems See HPI Denies recent fever or chills. Denies sinus pressure, nasal congestion, ear pain or sore throat. Denies chest congestion, productive cough or wheezing. Denies PND, orthopnea palpitations and leg swelling Denies abdominal pain, nausea, vomiting,diarrhea or constipation.   Denies dysuria, frequency, hesitancy or incontinence. . Denies headaches, seizures, numbness, or tingling. Denies depression, anxiety or insomnia. Denies skin break down or rash.        Objective:   Physical Exam Patient alert and oriented and in no cardiopulmonary distress.  HEENT: No facial asymmetry, EOMI, no sinus tenderness,  oropharynx pink and moist.  Neck supple no adenopathy.  Chest: Clear to auscultation  bilaterally.Decreased air entry bilaterally, no chest wall tendeness  CVS: S1, S2 no murmurs, no S3. EKG: LBB  ABD: Soft non tender. Bowel sounds normal.  Ext: No edema  MS: Adequate ROM spine, shoulders, hips and knees. Foot exam :bilateral bunions and corns, no localized erythema, warmth or tenderness of affected toe, plantar wart present   Skin: Intact, no ulcerations or rash noted.  Psych: Good eye contact, normal affect. Memory intact not anxious or depressed appearing.  CNS: CN 2-12 intact, power, tone and sensation normal throughout.        Assessment & Plan:

## 2012-01-05 NOTE — Patient Instructions (Addendum)
F/u in 4 month  Please get fasting labs as soon as possiblle,cbc, lipid, chem 7 HBA1C, TSH, the cardiologist will need them  You need both the flu vaccine and the shingles vaccine, you will get info on shingles vaccine  You are referred to podiatrist re toe pain, and plantar wart  EKG in office today since you complain of chest pain, since abnormal you are being referred to [facility] cardiology in [place]  You need a colonoscopy if none in past 10 years.Please sign for [place] to get the report from your gynecologist so we know when next due  Congrats on decision to quit smoking , pls set a quit date 2 weeks after starting zyban one every morning for 3 days then one twice daily

## 2012-01-07 DIAGNOSIS — M79672 Pain in left foot: Secondary | ICD-10-CM | POA: Insufficient documentation

## 2012-01-07 DIAGNOSIS — M79671 Pain in right foot: Secondary | ICD-10-CM | POA: Insufficient documentation

## 2012-01-07 NOTE — Assessment & Plan Note (Signed)
LBB in patient with several month h/o left chest to axilla pain, and chronic nicotine use, refer for further eval by cardiology

## 2012-01-07 NOTE — Assessment & Plan Note (Signed)
Unchanged, Smoking cessation counseling done. Patient counseled for approximately 5 minutes regarding the health risks of ongoing nicotine use, specifically all types of cancer, heart disease, stroke and respiratory failure. The options available for help with cessation ,the behavioral changes to assist the process, and the option to either gradully reduce usage  Or abruptly stop.is also discussed. Pt is also encouraged to set specific goals in number of cigarettes used daily, as well as to set a quit date. Will start zyban and join a class

## 2012-01-07 NOTE — Assessment & Plan Note (Signed)
Unchanged. Patient re-educated about  the importance of commitment to a  minimum of 150 minutes of exercise per week. The importance of healthy food choices with portion control discussed. Encouraged to start a food diary, count calories and to consider  joining a support group. Sample diet sheets offered. Goals set by the patient for the next several months.    

## 2012-01-07 NOTE — Assessment & Plan Note (Signed)
C/o bilaterl foot pain, callus and right 3rd toe pain will refer to podiatry

## 2012-01-07 NOTE — Assessment & Plan Note (Signed)
Improved when last checked ,but still markedly elevated, pt  Refused statin in the past but with new complaints wil hopefully be mmore amenable to statin use until card eval complete at least. Neds to quit smoking also

## 2012-01-08 LAB — LIPID PANEL
HDL: 42 mg/dL (ref >39)
Total CHOL/HDL Ratio: 5.5 Ratio
Triglycerides: 112 mg/dL (ref <150)

## 2012-01-08 LAB — CBC WITH DIFFERENTIAL/PLATELET
Basophils Absolute: 0.1 10*3/uL (ref 0.0–0.1)
Eosinophils Relative: 3 % (ref 0–5)
HCT: 38.2 % (ref 36.0–46.0)
Lymphocytes Relative: 57 % — ABNORMAL HIGH (ref 12–46)
Lymphs Abs: 4.6 10*3/uL — ABNORMAL HIGH (ref 0.7–4.0)
MCV: 84 fL (ref 78.0–100.0)
Monocytes Absolute: 0.6 10*3/uL (ref 0.1–1.0)
Neutro Abs: 2.6 10*3/uL (ref 1.7–7.7)
Platelets: 320 10*3/uL (ref 150–400)
RBC: 4.55 MIL/uL (ref 3.87–5.11)
WBC: 8 10*3/uL (ref 4.0–10.5)

## 2012-01-08 LAB — BASIC METABOLIC PANEL
CO2: 24 mEq/L (ref 19–32)
Calcium: 9.2 mg/dL (ref 8.4–10.5)
Creat: 0.85 mg/dL (ref 0.50–1.10)
Glucose, Bld: 86 mg/dL (ref 70–99)
Sodium: 141 mEq/L (ref 135–145)

## 2012-01-08 LAB — HEMOGLOBIN A1C: Hgb A1c MFr Bld: 6 % — ABNORMAL HIGH (ref <5.7)

## 2012-01-19 ENCOUNTER — Encounter: Payer: Self-pay | Admitting: Family Medicine

## 2012-01-28 ENCOUNTER — Encounter: Payer: Self-pay | Admitting: Cardiovascular Disease

## 2012-01-28 ENCOUNTER — Ambulatory Visit (INDEPENDENT_AMBULATORY_CARE_PROVIDER_SITE_OTHER): Payer: BC Managed Care – PPO | Admitting: Cardiovascular Disease

## 2012-01-28 VITALS — BP 130/84 | HR 64 | Ht 66.0 in | Wt 169.1 lb

## 2012-01-28 DIAGNOSIS — I447 Left bundle-branch block, unspecified: Secondary | ICD-10-CM

## 2012-01-28 DIAGNOSIS — R0789 Other chest pain: Secondary | ICD-10-CM

## 2012-01-28 NOTE — Progress Notes (Signed)
    Kristin Blackwell Date of Birth  09/29/1950       [facility]    [place] [address]  [address] [place], [place]  [address]   [place], [place]  [address] [phone]     [phone]   Fax  [phone]    Fax [phone]  Problem List: 1. LBBB 2. Left sided chest wall pain 3.   History of Present Illness:  Kristin Blackwell is a 54 female with a past hx of left sided back and side chest.  It is not related to exertion, eating, drinking, change of position, taking a deep breath.  The pain has been present most of this year.   She does not get any regular exercise.    Current Outpatient Prescriptions on File Prior to Visit  Medication Sig Dispense Refill  . albuterol (PROVENTIL HFA;VENTOLIN HFA) 108 (90 BASE) MCG/ACT inhaler Inhale 2 puffs into the lungs every 4 (four) hours as needed for wheezing.  1 Inhaler  2    Allergies  Allergen Reactions  . Ibandronate Sodium     Past Medical History  Diagnosis Date  . Nicotine addiction   . Overweight     Past Surgical History  Procedure Date  . Partial hysterectomy 1987    had one ovary removed     History  Smoking status  . Current Every Day Smoker -- 1.0 packs/day  . Types: Cigarettes  Smokeless tobacco  . Not on file    History  Alcohol Use No    Family History  Problem Relation Age of Onset  . Hypertension Mother   . Emphysema Father   . Hypertension Sister   . Diabetes Brother     oldest     Reviw of Systems:  Reviewed in the HPI.  All other systems are negative.  Physical Exam: Blood pressure 130/84, pulse 64, height 5\' 6"  (1.676 m), weight 169 lb 1.9 oz (76.712 kg), SpO2 98.00%. General: Well developed, well nourished, in no acute distress.  Head: Normocephalic, atraumatic, sclera non-icteric, mucus membranes are moist,   Neck: Supple. Carotids are 2 + without bruits. No JVD   Lungs: Clear   Heart: RR, chest wall is not tender  Abdomen: Soft,  non-tender, non-distended with normal bowel sounds.  Msk:  Strength and tone are normal   Extremities: No clubbing or cyanosis. No edema.  Distal pedal pulses are 2+ and equal    Neuro: CN II - XII intact.  Alert and oriented X 3.   Psych:  Normal   ECG: Nov. 11, 2013 - NSR, LBBB  Assessment / Plan:

## 2012-01-28 NOTE — Assessment & Plan Note (Signed)
Kristin Blackwell presents with a left bundle branch block. She has vague, atypical chest pains that I am fairly sure are not related to coronary artery disease. Because of her left bundle branch block, vague chest pain  and her history of cigarette smoking we will proceed with an adenosine Myoview study.  We will not schedule her a return appointment unless the Myoview study is abnormal. I be happy to see her in the future if needed.  I've advised her to stop smoking. I've also advised her to work on a good diet, exercise, and weight loss program.

## 2012-01-28 NOTE — Patient Instructions (Signed)
Your physician has requested that you have an adenosine myoview.  Please follow instruction sheet, as given.  Your physician recommends that you schedule a follow-up appointment in: as needed basis depending on test results

## 2012-01-28 NOTE — Addendum Note (Signed)
Addended by: Antony Odea on: 01/28/2012 09:36 AM   Modules accepted: Orders

## 2012-02-02 ENCOUNTER — Telehealth: Payer: Self-pay | Admitting: Family Medicine

## 2012-02-03 NOTE — Telephone Encounter (Signed)
Called and left message notifying patient that letter has been sent along with lab requisition for repeat labs.

## 2012-02-03 NOTE — Addendum Note (Signed)
Addended by: Kandis Fantasia B on: 02/03/2012 11:48 AM   Modules accepted: Orders

## 2012-02-18 ENCOUNTER — Ambulatory Visit (HOSPITAL_COMMUNITY): Payer: BC Managed Care – PPO | Attending: Cardiology | Admitting: Radiology

## 2012-02-18 VITALS — BP 142/86 | HR 58 | Ht 66.0 in | Wt 172.0 lb

## 2012-02-18 DIAGNOSIS — I447 Left bundle-branch block, unspecified: Secondary | ICD-10-CM

## 2012-02-18 DIAGNOSIS — R072 Precordial pain: Secondary | ICD-10-CM | POA: Insufficient documentation

## 2012-02-18 DIAGNOSIS — R0789 Other chest pain: Secondary | ICD-10-CM

## 2012-02-18 DIAGNOSIS — I4949 Other premature depolarization: Secondary | ICD-10-CM

## 2012-02-18 DIAGNOSIS — R0602 Shortness of breath: Secondary | ICD-10-CM

## 2012-02-18 DIAGNOSIS — R079 Chest pain, unspecified: Secondary | ICD-10-CM

## 2012-02-18 MED ORDER — TECHNETIUM TC 99M SESTAMIBI GENERIC - CARDIOLITE
11.0000 | Freq: Once | INTRAVENOUS | Status: AC | PRN
  Administered 2012-02-18: 11 via INTRAVENOUS

## 2012-02-18 MED ORDER — ADENOSINE (DIAGNOSTIC) 3 MG/ML IV SOLN
0.5600 mg/kg | Freq: Once | INTRAVENOUS | Status: AC
  Administered 2012-02-18: 43.8 mg via INTRAVENOUS

## 2012-02-18 MED ORDER — TECHNETIUM TC 99M SESTAMIBI GENERIC - CARDIOLITE
33.0000 | Freq: Once | INTRAVENOUS | Status: AC | PRN
  Administered 2012-02-18: 33 via INTRAVENOUS

## 2012-02-18 NOTE — Progress Notes (Signed)
[facility] SITE 3 NUCLEAR MED [address] [place], [place] [address] [phone]    Cardiology Nuclear Med Study  Kristin Blackwell is a 62 y.o. female     MRN : [id number]     DOB: 06-10-1950  Procedure Date: 02/18/2012  Nuclear Med Background Indication for Stress Test:  Evaluation for Ischemia and Abnormal EKG History:  No previously documented CAD Cardiac Risk Factors: New LBBB, Lipids and Smoker   Symptoms:  Chest Pain/Tightness with and without Exertion (last episode of chest discomfort was Friday), DOE, Fatigue, Light-Headedness, Palpitations and Rapid HR   Nuclear Pre-Procedure Caffeine/Decaff Intake:  None NPO After: 3:30 pm   Lungs:  Clear. O2 Sat: 98% on room air. IV 0.9% NS with Angio Cath:  22g  IV Site: R Hand  IV Started by:  Bonnita Levan, RN  Chest Size (in):  40 Cup Size: DD  Height: 5\' 6"  (1.676 m)  Weight:  172 lb (78.019 kg)  BMI:  Body mass index is 27.76 kg/(m^2). Tech Comments:  N/A    Nuclear Med Study 1 or 2 day study: 1 day  Stress Test Type:  Adenosine  Reading MD: Cassell Clement, MD  Order Authorizing Provider:  Kristeen Miss, MD  Resting Radionuclide: Technetium 97m Sestamibi  Resting Radionuclide Dose: 11.0 mCi   Stress Radionuclide:  Technetium 37m Sestamibi  Stress Radionuclide Dose: 32.8 mCi           Stress Protocol Rest HR: 58 Stress HR: 77  Rest BP: 142/86 Stress BP: 151/96  Exercise Time (min): n/a METS: n/a   Predicted Max HR: 159 bpm % Max HR: 48.43 bpm Rate Pressure Product: 21308    Dose of Adenosine (mg):  43.8 Dose of Lexiscan: n/a mg  Dose of Atropine (mg): n/a Dose of Dobutamine: n/a mcg/kg/min (at max HR)  Stress Test Technologist: Smiley Houseman, CMA-N  Nuclear Technologist:  Domenic Polite, CNMT     Rest Procedure:  Myocardial perfusion imaging was performed at rest 45 minutes following the intravenous administration of Technetium 74m Sestamibi.  Rest ECG: NSR-LBBB  Stress Procedure:  The patient  received IV adenosine at 140 mcg/kg/min for 4 minutes.  She c/o chest tightness, 6-7/10, with infusion.  Technetium 60m Sestamibi was injected at the 2 minute mark and quantitative spect images were obtained after a 45 minute delay.  Stress ECG: Uninteretable due to baseline LBBB  QPS Raw Data Images:  Normal; no motion artifact; normal heart/lung ratio. Stress Images:  There is mild apical thinning with normal uptake in other regions. Rest Images:  There is mild apical thinning with normal uptake in other regions. Subtraction (SDS):  No evidence of ischemia. Transient Ischemic Dilatation (Normal <1.22):  1.15 Lung/Heart Ratio (Normal <0.45):  0.40  Quantitative Gated Spect Images QGS EDV:  93 ml QGS ESV:  42 ml  Impression Exercise Capacity:  Adenosine study with no exercise. BP Response:  Normal blood pressure response. Clinical Symptoms:  Significant chest pain. ECG Impression:  Baseline:  LBBB.  EKG uninterpretable due to LBBB at rest and stress. Comparison with Prior Nuclear Study: No previous nuclear study performed  Overall Impression:  Normal stress nuclear study.  LV Ejection Fraction: 55%.  LV Wall Motion:  NL LV Function; NL Wall Motion  [employer]

## 2012-05-10 ENCOUNTER — Encounter: Payer: Self-pay | Admitting: Family Medicine

## 2012-05-10 ENCOUNTER — Ambulatory Visit (INDEPENDENT_AMBULATORY_CARE_PROVIDER_SITE_OTHER): Payer: BC Managed Care – PPO | Admitting: Family Medicine

## 2012-05-10 VITALS — BP 118/82 | HR 68 | Resp 16 | Ht 66.0 in | Wt 173.0 lb

## 2012-05-10 DIAGNOSIS — R7309 Other abnormal glucose: Secondary | ICD-10-CM

## 2012-05-10 DIAGNOSIS — E663 Overweight: Secondary | ICD-10-CM

## 2012-05-10 DIAGNOSIS — R7301 Impaired fasting glucose: Secondary | ICD-10-CM

## 2012-05-10 DIAGNOSIS — E8881 Metabolic syndrome: Secondary | ICD-10-CM

## 2012-05-10 DIAGNOSIS — F17200 Nicotine dependence, unspecified, uncomplicated: Secondary | ICD-10-CM

## 2012-05-10 DIAGNOSIS — E785 Hyperlipidemia, unspecified: Secondary | ICD-10-CM

## 2012-05-10 DIAGNOSIS — R7303 Prediabetes: Secondary | ICD-10-CM

## 2012-05-10 NOTE — Patient Instructions (Addendum)
F/u in 4.5 month  Fasting lipid, HBA1C THIS WEEK pLEASE.  HBA1C  In 4.5 month  Please set a quit date, start the welbutrin , you need to STOP smoking. We will give you support info   Limit carb intake to 15 to 30 gm per meal, we will give you information on this  It is important that you exercise regularly at least 30 minutes 5 times a week. If you develop chest pain, have severe difficulty breathing, or feel very tired, stop exercising immediately and seek medical attention    Weight loss goal of 6 pounds in the next 4.5 months  We will give you info on the shingles vaccine

## 2012-05-17 LAB — LIPID PANEL
Cholesterol: 267 mg/dL — ABNORMAL HIGH (ref 0–200)
HDL: 46 mg/dL (ref >39)
Total CHOL/HDL Ratio: 5.8 Ratio
Triglycerides: 100 mg/dL (ref <150)
VLDL: 20 mg/dL (ref 0–40)

## 2012-05-17 LAB — HEMOGLOBIN A1C: Mean Plasma Glucose: 134 mg/dL — ABNORMAL HIGH (ref <117)

## 2012-05-24 ENCOUNTER — Other Ambulatory Visit: Payer: Self-pay

## 2012-05-24 MED ORDER — PRAVASTATIN SODIUM 80 MG PO TABS: 80.0000 mg | ORAL_TABLET | Freq: Every evening | ORAL | Status: DC

## 2012-05-29 DIAGNOSIS — E8881 Metabolic syndrome: Secondary | ICD-10-CM | POA: Insufficient documentation

## 2012-05-29 DIAGNOSIS — R7303 Prediabetes: Secondary | ICD-10-CM | POA: Insufficient documentation

## 2012-05-29 NOTE — Assessment & Plan Note (Signed)
The importance of weight loss, blood sugar control and discontinuing smoking to reduce cardiovascular risk is discussed

## 2012-05-29 NOTE — Progress Notes (Signed)
  Subjective:    Patient ID: Kristin Blackwell, female    DOB: 1950/06/17, 62 y.o.   MRN: [id number]  HPI The PT is here for follow up and re-evaluation of chronic medical conditions, medication management and review of any available recent lab and radiology data.  Preventive health is updated, specifically  Cancer screening and Immunization.   Questions or concerns regarding consultations or procedures which the PT has had in the interim are  Addressed.Was evaluated for cAD by cardiology, though pt remains at very high risk, tests are normal at this time   There are no new concerns. States espescialy due to job and increased insurance rates, she needs  to stop smoking, still will not set a quit date however There are no specific complaints , no commitment yet to regular exercise      Review of Systems See HPI Denies recent fever or chills. Denies sinus pressure, nasal congestion, ear pain or sore throat. Denies chest congestion, productive cough or wheezing. Denies chest pains, palpitations and leg swelling Denies abdominal pain, nausea, vomiting,diarrhea or constipation.   Denies dysuria, frequency, hesitancy or incontinence. Denies joint pain, swelling and limitation in mobility. Denies headaches, seizures, numbness, or tingling. Denies depression, anxiety or insomnia. Denies skin break down or rash.        Objective:   Physical Exam  Patient alert and oriented and in no cardiopulmonary distress.  HEENT: No facial asymmetry, EOMI, no sinus tenderness,  oropharynx pink and moist.  Neck supple no adenopathy.  Chest: Clear to auscultation bilaterally.Decreased air entry bilaterally  CVS: S1, S2 no murmurs, no S3.  ABD: Soft non tender. Bowel sounds normal.  Ext: No edema  MS: Adequate ROM spine, shoulders, hips and knees.  Skin: Intact, no ulcerations or rash noted.  Psych: Good eye contact, normal affect. Memory intact not anxious or depressed appearing.  CNS: CN  2-12 intact, power, tone and sensation normal throughout.       Assessment & Plan:

## 2012-05-29 NOTE — Assessment & Plan Note (Signed)
Deteriorated Patient educated about the importance of limiting  Carbohydrate intake , the need to commit to daily physical activity for a minimum of 30 minutes , and to commit weight loss. The fact that changes in all these areas will reduce or eliminate all together the development of diabetes is stressed.    

## 2012-05-29 NOTE — Assessment & Plan Note (Signed)
Deteriorated. Pt to comply with med management and focus also on low fat diet, with reduced cheese intake

## 2012-05-29 NOTE — Assessment & Plan Note (Signed)
Deteriorated Patient counseled for approximately 5 minutes regarding the health risks of ongoing nicotine use, specifically all types of cancer, heart disease, stroke and respiratory failure. The options available for help with cessation ,the behavioral changes to assist the process, and the option to either gradully reduce usage  Or abruptly stop.is also discussed. Pt is also encouraged to set specific goals in number of cigarettes used daily, as well as to set a quit date.    

## 2012-05-29 NOTE — Assessment & Plan Note (Signed)
Deteriorated. Patient re-educated about  the importance of commitment to a  minimum of 150 minutes of exercise per week. The importance of healthy food choices with portion control discussed. Encouraged to start a food diary, count calories and to consider  joining a support group. Sample diet sheets offered. Goals set by the patient for the next several months.    

## 2012-09-20 ENCOUNTER — Encounter: Payer: Self-pay | Admitting: Family Medicine

## 2012-09-20 ENCOUNTER — Ambulatory Visit (INDEPENDENT_AMBULATORY_CARE_PROVIDER_SITE_OTHER): Payer: BC Managed Care – PPO | Admitting: Family Medicine

## 2012-09-20 VITALS — BP 110/72 | HR 61 | Resp 16 | Ht 66.0 in | Wt 170.0 lb

## 2012-09-20 DIAGNOSIS — Z23 Encounter for immunization: Secondary | ICD-10-CM

## 2012-09-20 DIAGNOSIS — R5383 Other fatigue: Secondary | ICD-10-CM

## 2012-09-20 DIAGNOSIS — F17200 Nicotine dependence, unspecified, uncomplicated: Secondary | ICD-10-CM

## 2012-09-20 DIAGNOSIS — R7309 Other abnormal glucose: Secondary | ICD-10-CM

## 2012-09-20 DIAGNOSIS — E8881 Metabolic syndrome: Secondary | ICD-10-CM

## 2012-09-20 DIAGNOSIS — R7303 Prediabetes: Secondary | ICD-10-CM

## 2012-09-20 DIAGNOSIS — IMO0001 Reserved for inherently not codable concepts without codable children: Secondary | ICD-10-CM

## 2012-09-20 DIAGNOSIS — R5381 Other malaise: Secondary | ICD-10-CM

## 2012-09-20 DIAGNOSIS — E785 Hyperlipidemia, unspecified: Secondary | ICD-10-CM

## 2012-09-20 DIAGNOSIS — E663 Overweight: Secondary | ICD-10-CM

## 2012-09-20 DIAGNOSIS — M791 Myalgia, unspecified site: Secondary | ICD-10-CM

## 2012-09-20 DIAGNOSIS — R252 Cramp and spasm: Secondary | ICD-10-CM

## 2012-09-20 DIAGNOSIS — Z2911 Encounter for prophylactic immunotherapy for respiratory syncytial virus (RSV): Secondary | ICD-10-CM

## 2012-09-20 NOTE — Assessment & Plan Note (Addendum)
Hyperlipidemia:Low fat diet discussed and encouraged.  Updated lab to be obtained  Pt may be statin intolerant, describes muscle aches intermittently, will reduce dose to 3 times weekly check muscle enzymes

## 2012-09-20 NOTE — Assessment & Plan Note (Signed)
Undated. Patient re-educated about  the importance of commitment to a  minimum of 150 minutes of exercise per week. The importance of healthy food choices with portion control discussed. Encouraged to start a food diary, count calories and to consider  joining a support group. Sample diet sheets offered. Goals set by the patient for the next several months.    Patient re-educated about  the importance of commitment to a  minimum of 150 minutes of exercise per week. The importance of healthy food choices with portion control discussed. Encouraged to start a food diary, count calories and to consider  joining a support group. Sample diet sheets offered. Goals set by the patient for the next several months.

## 2012-09-20 NOTE — Patient Instructions (Addendum)
F/u in 4 month, call if you need me before Flu vaccine available in October, call to get it here or on your job  Zostavax today  CONGRATS on smoking cessation, keep it up  STAY AWAY from sugar and all products that have added sugar please  Fasting lipid, cmp, hBA1C and CPK, CKMB this week if possible, no later than next week   Fasting lipid, cmp, HBA1C in December approx 3 to 5 days before visit  Please commit to daily exercise for at least 30 mintes total  Reduce pravastatin to 3 days per week, Monday, Wednesday, Friday, and see if cramps are less

## 2012-09-20 NOTE — Assessment & Plan Note (Signed)
Updated Patient educated about the importance of limiting  Carbohydrate intake , the need to commit to daily physical activity for a minimum of 30 minutes , and to commit weight loss. The fact that changes in all these areas will reduce or eliminate all together the development of diabetes is stressed.

## 2012-09-20 NOTE — Progress Notes (Signed)
  Subjective:    Patient ID: Kristin Blackwell, female    DOB: 07/26/1950, 62 y.o.   MRN: [id number]  HPI The PT is here for follow up and re-evaluation of chronic medical conditions, medication management and review of any available recent lab and radiology data.  Preventive health is updated, specifically  Cancer screening and Immunization.  Will send for colonoscopy Questions or concerns regarding consultations or procedures which the PT has had in the interim are  addressed. The PT c/o intermittent muscle cramps, esp affecting the legs, which she attributes to statin therapy.  Proud to announce smoking cessation x 1 week  Exercise is still intermittent, and the importance of quitting sugar is stressed as she is prediabetic      Review of Systems See HPI Denies recent fever or chills. Denies sinus pressure, nasal congestion, ear pain or sore throat. Denies chest congestion, productive cough or wheezing. Denies chest pains, palpitations and leg swelling Denies abdominal pain, nausea, vomiting,diarrhea or constipation.   Denies dysuria, frequency, hesitancy or incontinence. Denies joint pain, swelling and limitation in mobility. Denies headaches, seizures, numbness, or tingling. Denies depression, anxiety or insomnia. Denies skin break down or rash.        Objective:   Physical Exam  Patient alert and oriented and in no cardiopulmonary distress.  HEENT: No facial asymmetry, EOMI, no sinus tenderness,  oropharynx pink and moist.  Neck supple no adenopathy.  Chest: Clear to auscultation bilaterally.Decreased air entry throughout  CVS: S1, S2 no murmurs, no S3.  ABD: Soft non tender. Bowel sounds normal.  Ext: No edema  MS: Adequate ROM spine, shoulders, hips and knees.  Skin: Intact, no ulcerations or rash noted.  Psych: Good eye contact, normal affect. Memory intact not anxious or depressed appearing.  CNS: CN 2-12 intact, power, tone and sensation normal  throughout.       Assessment & Plan:

## 2012-09-21 NOTE — Addendum Note (Signed)
Addended by: Abner Greenspan on: 09/21/2012 08:26 AM   Modules accepted: Orders

## 2012-09-22 ENCOUNTER — Telehealth: Payer: Self-pay | Admitting: Family Medicine

## 2012-09-22 NOTE — Telephone Encounter (Signed)
Pls notify pt that there is no colonoscopy report available at her gyne office, so she needs to try to recall where she had it or who did it, to ensure she keeps up with recommended screening of every 10 years

## 2012-09-23 LAB — COMPREHENSIVE METABOLIC PANEL
Alkaline Phosphatase: 62 U/L (ref 39–117)
BUN: 10 mg/dL (ref 6–23)
CO2: 25 mEq/L (ref 19–32)
Creat: 0.79 mg/dL (ref 0.50–1.10)
Glucose, Bld: 97 mg/dL (ref 70–99)
Sodium: 141 mEq/L (ref 135–145)
Total Bilirubin: 0.3 mg/dL (ref 0.3–1.2)
Total Protein: 6.7 g/dL (ref 6.0–8.3)

## 2012-09-23 LAB — LIPID PANEL
HDL: 39 mg/dL — ABNORMAL LOW (ref >39)
Total CHOL/HDL Ratio: 3.9 Ratio
Triglycerides: 102 mg/dL (ref <150)

## 2012-09-23 LAB — HEMOGLOBIN A1C
Hgb A1c MFr Bld: 6.1 % — ABNORMAL HIGH (ref <5.7)
Mean Plasma Glucose: 128 mg/dL — ABNORMAL HIGH (ref <117)

## 2012-09-23 LAB — CK TOTAL AND CKMB (NOT AT ARMC): Relative Index: 2.5 (ref 0.0–2.5)

## 2012-11-30 NOTE — Telephone Encounter (Signed)
Called and left message for patient to return call if she can remember.

## 2012-12-22 ENCOUNTER — Other Ambulatory Visit: Payer: Self-pay

## 2012-12-30 ENCOUNTER — Ambulatory Visit: Payer: BC Managed Care – PPO

## 2013-01-24 ENCOUNTER — Encounter: Payer: Self-pay | Admitting: Family Medicine

## 2013-01-24 ENCOUNTER — Ambulatory Visit (INDEPENDENT_AMBULATORY_CARE_PROVIDER_SITE_OTHER): Payer: BC Managed Care – PPO | Admitting: Family Medicine

## 2013-01-24 VITALS — BP 118/72 | HR 62 | Resp 18 | Ht 66.0 in | Wt 172.1 lb

## 2013-01-24 DIAGNOSIS — E663 Overweight: Secondary | ICD-10-CM

## 2013-01-24 DIAGNOSIS — R7309 Other abnormal glucose: Secondary | ICD-10-CM

## 2013-01-24 DIAGNOSIS — E8881 Metabolic syndrome: Secondary | ICD-10-CM

## 2013-01-24 DIAGNOSIS — Z23 Encounter for immunization: Secondary | ICD-10-CM

## 2013-01-24 DIAGNOSIS — E785 Hyperlipidemia, unspecified: Secondary | ICD-10-CM

## 2013-01-24 DIAGNOSIS — J309 Allergic rhinitis, unspecified: Secondary | ICD-10-CM

## 2013-01-24 DIAGNOSIS — R7303 Prediabetes: Secondary | ICD-10-CM

## 2013-01-24 MED ORDER — LORATADINE 10 MG PO TBDP: 10.0000 mg | ORAL_TABLET | Freq: Every day | ORAL | Status: DC

## 2013-01-24 MED ORDER — FLUTICASONE PROPIONATE 50 MCG/ACT NA SUSP: 2.0000 | Freq: Every day | NASAL | Status: DC

## 2013-01-24 NOTE — Patient Instructions (Addendum)
F/u in 5 month, call if you need me before  Congrats on staying nicotine free  Flu vaccine today  Commit to daily exercise to improve health  Pls  Ask your gyne for last colonscopy info, I need this to complete your record  Fasting lipid, cmp HBA1C, CBc and TSH this week please  Start DAILY allergy meds as discussed

## 2013-01-25 ENCOUNTER — Telehealth: Payer: Self-pay | Admitting: Family Medicine

## 2013-01-25 NOTE — Telephone Encounter (Signed)
noted 

## 2013-01-27 LAB — LIPID PANEL
LDL Cholesterol: 104 mg/dL — ABNORMAL HIGH (ref 0–99)
Triglycerides: 113 mg/dL (ref <150)

## 2013-01-27 LAB — CBC
Hemoglobin: 12.4 g/dL (ref 12.0–15.0)
MCHC: 32.3 g/dL (ref 30.0–36.0)
Platelets: 326 10*3/uL (ref 150–400)
RDW: 15.4 % (ref 11.5–15.5)

## 2013-01-27 LAB — COMPREHENSIVE METABOLIC PANEL
ALT: 26 U/L (ref 0–35)
AST: 29 U/L (ref 0–37)
Albumin: 3.9 g/dL (ref 3.5–5.2)
BUN: 12 mg/dL (ref 6–23)
Calcium: 9.2 mg/dL (ref 8.4–10.5)
Chloride: 106 mEq/L (ref 96–112)
Potassium: 4.2 mEq/L (ref 3.5–5.3)
Sodium: 139 mEq/L (ref 135–145)
Total Protein: 6.9 g/dL (ref 6.0–8.3)

## 2013-01-28 ENCOUNTER — Encounter: Payer: Self-pay | Admitting: Family Medicine

## 2013-01-28 LAB — HEMOGLOBIN A1C: Hgb A1c MFr Bld: 6.3 % — ABNORMAL HIGH (ref <5.7)

## 2013-01-29 NOTE — Assessment & Plan Note (Signed)
Improved, lDL still elevated Hyperlipidemia:Low fat diet discussed and encouraged.  No med change

## 2013-01-29 NOTE — Assessment & Plan Note (Signed)
Increased and uncontrolled symptoms since change in season. Re educated re the need to use med daily for control, also saline nasal flushes

## 2013-01-29 NOTE — Assessment & Plan Note (Signed)
Deteriorated Patient educated about the importance of limiting  Carbohydrate intake , the need to commit to daily physical activity for a minimum of 30 minutes , and to commit weight loss. The fact that changes in all these areas will reduce or eliminate all together the development of diabetes is stressed.    

## 2013-01-29 NOTE — Progress Notes (Signed)
   Subjective:    Patient ID: Kristin Blackwell, female    DOB: 1950-02-21, 62 y.o.   MRN: [id number]  HPI The PT is here for follow up and re-evaluation of chronic medical conditions, medication management and review of any available recent lab and radiology data.  Preventive health is updated, specifically  Cancer screening and Immunization.   Questions or concerns regarding consultations or procedures which the PT has had in the interim are  addressed. The PT denies any adverse reactions to current medications since the last visit.  2 week h/o increased clear nasal drainage and sinus pressure, denies fevr or chills, no productive cough. Reports that she continues to remain nicotine free     Review of Systems See HPI Denies recent fever or chills. Denies sinus pressure,  ear pain or sore throat. Denies chest congestion, productive cough or wheezing. Denies chest pains, palpitations and leg swelling Denies abdominal pain, nausea, vomiting,diarrhea or constipation.   Denies dysuria, frequency, hesitancy or incontinence. Denies joint pain, swelling and limitation in mobility. Denies headaches, seizures, numbness, or tingling. Denies depression, anxiety or insomnia. Denies skin break down or rash.        Objective:   Physical Exam  Patient alert and oriented and in no cardiopulmonary distress.  HEENT: No facial asymmetry, EOMI, no sinus tenderness,  oropharynx pink and moist.  Neck supple no adenopathy.  Chest: Clear to auscultation bilaterally.  CVS: S1, S2 no murmurs, no S3.  ABD: Soft non tender. Bowel sounds normal.  Ext: No edema  MS: Adequate ROM spine, shoulders, hips and knees.  Skin: Intact, no ulcerations or rash noted.  Psych: Good eye contact, normal affect. Memory intact not anxious or depressed appearing.  CNS: CN 2-12 intact, power, tone and sensation normal throughout.       Assessment & Plan:

## 2013-01-29 NOTE — Assessment & Plan Note (Signed)
Unchanged. Patient re-educated about  the importance of commitment to a  minimum of 150 minutes of exercise per week. The importance of healthy food choices with portion control discussed. Encouraged to start a food diary, count calories and to consider  joining a support group. Sample diet sheets offered. Goals set by the patient for the next several months.    

## 2013-01-29 NOTE — Assessment & Plan Note (Signed)
Pt aware of increased CAD risk and the need to improve her health habits

## 2013-03-04 ENCOUNTER — Other Ambulatory Visit: Payer: Self-pay | Admitting: Family Medicine

## 2013-06-27 ENCOUNTER — Ambulatory Visit: Payer: BC Managed Care – PPO | Admitting: Family Medicine

## 2013-07-04 ENCOUNTER — Ambulatory Visit (INDEPENDENT_AMBULATORY_CARE_PROVIDER_SITE_OTHER): Payer: BC Managed Care – PPO | Admitting: Family Medicine

## 2013-07-04 ENCOUNTER — Encounter: Payer: Self-pay | Admitting: Family Medicine

## 2013-07-04 VITALS — BP 138/72 | HR 94 | Resp 18 | Ht 66.0 in | Wt 175.0 lb

## 2013-07-04 DIAGNOSIS — R7303 Prediabetes: Secondary | ICD-10-CM

## 2013-07-04 DIAGNOSIS — J3089 Other allergic rhinitis: Secondary | ICD-10-CM

## 2013-07-04 DIAGNOSIS — J302 Other seasonal allergic rhinitis: Secondary | ICD-10-CM

## 2013-07-04 DIAGNOSIS — E785 Hyperlipidemia, unspecified: Secondary | ICD-10-CM

## 2013-07-04 DIAGNOSIS — E8881 Metabolic syndrome: Secondary | ICD-10-CM

## 2013-07-04 DIAGNOSIS — E663 Overweight: Secondary | ICD-10-CM

## 2013-07-04 DIAGNOSIS — R7309 Other abnormal glucose: Secondary | ICD-10-CM

## 2013-07-04 MED ORDER — LORATADINE 10 MG PO TABS: 10.0000 mg | ORAL_TABLET | Freq: Every day | ORAL | Status: DC

## 2013-07-04 NOTE — Patient Instructions (Addendum)
F/u in 4 month, call if you need me before   Fasting lipid cmp , HBA1C  This week   Congrats on smoking cesation   It is important that you exercise regularly at least 30 minutes 5 times a week. If you develop chest pain, have severe difficulty breathing, or feel very tired, stop exercising immediately and seek medical attention   A healthy diet is rich in fruit, vegetables and whole grains. Poultry fish, nuts and beans are a healthy choice for protein rather then red meat. A low sodium diet and drinking 64 ounces of water daily is generally recommended. Oils and sweet should be limited. Carbohydrates especially for those who are diabetic or overweight, should be limited to 60-45 gram per meal. It is important to eat on a regular schedule, at least 3 times daily. Snacks should be primarily fruits, vegetables or nuts.

## 2013-07-08 ENCOUNTER — Encounter: Payer: Self-pay | Admitting: Family Medicine

## 2013-07-08 LAB — COMPREHENSIVE METABOLIC PANEL
ALBUMIN: 3.9 g/dL (ref 3.5–5.2)
ALT: 18 U/L (ref 0–35)
AST: 19 U/L (ref 0–37)
Alkaline Phosphatase: 69 U/L (ref 39–117)
BUN: 15 mg/dL (ref 6–23)
CALCIUM: 9.3 mg/dL (ref 8.4–10.5)
CHLORIDE: 106 meq/L (ref 96–112)
CO2: 26 mEq/L (ref 19–32)
CREATININE: 0.84 mg/dL (ref 0.50–1.10)
GLUCOSE: 80 mg/dL (ref 70–99)
POTASSIUM: 4.6 meq/L (ref 3.5–5.3)
Sodium: 139 mEq/L (ref 135–145)
Total Bilirubin: 0.3 mg/dL (ref 0.2–1.2)
Total Protein: 7.2 g/dL (ref 6.0–8.3)

## 2013-07-08 LAB — HEMOGLOBIN A1C
Hgb A1c MFr Bld: 6.2 % — ABNORMAL HIGH (ref <5.7)
Mean Plasma Glucose: 131 mg/dL — ABNORMAL HIGH (ref <117)

## 2013-07-08 LAB — LIPID PANEL
Cholesterol: 194 mg/dL (ref 0–200)
HDL: 51 mg/dL (ref >39)
LDL Cholesterol: 128 mg/dL — ABNORMAL HIGH (ref 0–99)
TRIGLYCERIDES: 77 mg/dL (ref <150)
Total CHOL/HDL Ratio: 3.8 Ratio
VLDL: 15 mg/dL (ref 0–40)

## 2013-08-19 NOTE — Assessment & Plan Note (Signed)
The increased risk of cardiovascular disease associated with this diagnosis, and the need to consistently work on lifestyle to change this is discussed. Following  a  heart healthy diet ,commitment to 30 minutes of exercise at least 5 days per week, as well as control of blood sugar and cholesterol , and achieving a healthy weight are all the areas to be addressed .  

## 2013-08-19 NOTE — Assessment & Plan Note (Signed)
Deteriorated. Patient re-educated about  the importance of commitment to a  minimum of 150 minutes of exercise per week. The importance of healthy food choices with portion control discussed. Encouraged to start a food diary, count calories and to consider  joining a support group. Sample diet sheets offered. Goals set by the patient for the next several months.    

## 2013-08-19 NOTE — Assessment & Plan Note (Signed)
Increased symptoms with weather change Resume daily medication, pt re educated

## 2013-08-19 NOTE — Progress Notes (Signed)
   Subjective:    Patient ID: Kristin Blackwell, female    DOB: April 02, 1950, 63 y.o.   MRN: [id number]  HPI The PT is here for follow up and re-evaluation of chronic medical conditions, medication management and review of any available recent lab and radiology data.  Preventive health is updated, specifically  Cancer screening and Immunization. Mammogram past due, needs this done  The PT denies any adverse reactions to current medications since the last visit.  There are no new concerns. However does c/o increase and uncontrolled allergy symptoms with the change in the season, and is not using her medication daily as directed There are no specific complaints, remains nicotine free which is great. Intends to be more consistent in exercise and dietary change to improve her health       Review of Systems    See HPI Denies recent fever or chills. Denies sinus pressure, nasal congestion, ear pain or sore throat. Denies chest congestion, productive cough or wheezing. Denies chest pains, palpitations and leg swelling Denies abdominal pain, nausea, vomiting,diarrhea or constipation.   Denies dysuria, frequency, hesitancy or incontinence. Denies joint pain, swelling and limitation in mobility. Denies headaches, seizures, numbness, or tingling. Denies depression, anxiety or insomnia. Denies skin break down or rash.     Objective:   Physical Exam BP 138/72  Pulse 94  Resp 18  Ht 5\' 6"  (1.676 m)  Wt 175 lb (79.379 kg)  BMI 28.26 kg/m2  SpO2 97% Patient alert and oriented and in no cardiopulmonary distress.  HEENT: No facial asymmetry, EOMI,   oropharynx pink and moist.  Neck supple no JVD, no mass.  Chest: Clear to auscultation bilaterally.  CVS: S1, S2 no murmurs, no S3.  ABD: Soft non tender.   Ext: No edema  MS: Adequate ROM spine, shoulders, hips and knees.  Skin: Intact, no ulcerations or rash noted.  Psych: Good eye contact, normal affect. Memory intact not anxious  or depressed appearing.  CNS: CN 2-12 intact, power,  normal throughout.no focal deficits noted.        Assessment & Plan:  HYPERLIPIDEMIA Deteriorated and uncontrolled Pt needs to comply with meds Hyperlipidemia:Low fat diet discussed and encouraged.    OVERWEIGHT Deteriorated. Patient re-educated about  the importance of commitment to a  minimum of 150 minutes of exercise per week. The importance of healthy food choices with portion control discussed. Encouraged to start a food diary, count calories and to consider  joining a support group. Sample diet sheets offered. Goals set by the patient for the next several months.     Metabolic syndrome X The increased risk of cardiovascular disease associated with this diagnosis, and the need to consistently work on lifestyle to change this is discussed. Following  a  heart healthy diet ,commitment to 30 minutes of exercise at least 5 days per week, as well as control of blood sugar and cholesterol , and achieving a healthy weight are all the areas to be addressed .   Prediabetes Improved Patient educated about the importance of limiting  Carbohydrate intake , the need to commit to daily physical activity for a minimum of 30 minutes , and to commit weight loss. The fact that changes in all these areas will reduce or eliminate all together the development of diabetes is stressed.     Allergic rhinitis Increased symptoms with weather change Resume daily medication, pt re educated

## 2013-08-19 NOTE — Assessment & Plan Note (Signed)
Improved Patient educated about the importance of limiting  Carbohydrate intake , the need to commit to daily physical activity for a minimum of 30 minutes , and to commit weight loss. The fact that changes in all these areas will reduce or eliminate all together the development of diabetes is stressed.

## 2013-08-19 NOTE — Assessment & Plan Note (Signed)
Deteriorated and uncontrolled Pt needs to comply with meds Hyperlipidemia:Low fat diet discussed and encouraged.

## 2013-11-15 ENCOUNTER — Ambulatory Visit (INDEPENDENT_AMBULATORY_CARE_PROVIDER_SITE_OTHER): Payer: BC Managed Care – PPO | Admitting: Family Medicine

## 2013-11-15 ENCOUNTER — Encounter: Payer: Self-pay | Admitting: Family Medicine

## 2013-11-15 VITALS — BP 126/80 | HR 78 | Resp 18 | Ht 66.0 in | Wt 178.1 lb

## 2013-11-15 DIAGNOSIS — E663 Overweight: Secondary | ICD-10-CM

## 2013-11-15 DIAGNOSIS — R7303 Prediabetes: Secondary | ICD-10-CM

## 2013-11-15 DIAGNOSIS — J3089 Other allergic rhinitis: Secondary | ICD-10-CM

## 2013-11-15 DIAGNOSIS — E785 Hyperlipidemia, unspecified: Secondary | ICD-10-CM

## 2013-11-15 DIAGNOSIS — M79609 Pain in unspecified limb: Secondary | ICD-10-CM

## 2013-11-15 DIAGNOSIS — R7309 Other abnormal glucose: Secondary | ICD-10-CM

## 2013-11-15 DIAGNOSIS — M79652 Pain in left thigh: Secondary | ICD-10-CM | POA: Insufficient documentation

## 2013-11-15 DIAGNOSIS — E8881 Metabolic syndrome: Secondary | ICD-10-CM

## 2013-11-15 NOTE — Patient Instructions (Addendum)
F/u in 4.5 month, call if you need me before  To evaluate left thigh pain which has been going on for months and awakens you at  Night I recommend an xray of the left thigh, and also an US of the circulation in your left lower extremity.  You will be contacted tomorrow re appt time and dater for US of the leg  Bunion on left foot is a likely problem that you will need to address when  You decide to  [facility] on staying nicotine free  You will get TWO lab orders, one is to be done by this Saturday please, the other is to be done 5 to 7 days BEFORE your next visit  Call about lab results next Monday PM if nurse does not contact you on your cell phone in the morning per your request  Please follow low fat and carb restricted diet and commit to daily exerciase and reduced portions so thahat you become more healthy

## 2013-11-16 ENCOUNTER — Other Ambulatory Visit: Payer: Self-pay | Admitting: Internal Medicine

## 2013-11-16 ENCOUNTER — Encounter: Payer: Self-pay | Admitting: Family Medicine

## 2013-11-16 ENCOUNTER — Ambulatory Visit (HOSPITAL_COMMUNITY)
Admission: RE | Admit: 2013-11-16 | Discharge: 2013-11-16 | Disposition: A | Discharge status: Home / Self Care | Payer: BC Managed Care – PPO | Source: Ambulatory Visit | Attending: Family Medicine | Admitting: Family Medicine

## 2013-11-16 DIAGNOSIS — M79652 Pain in left thigh: Secondary | ICD-10-CM | POA: Diagnosis present

## 2013-11-16 DIAGNOSIS — M79662 Pain in left lower leg: Secondary | ICD-10-CM | POA: Diagnosis present

## 2013-11-19 NOTE — Assessment & Plan Note (Signed)
Updated lab past due, will call p back when updated lab is available Patient educated about the importance of limiting  Carbohydrate intake , the need to commit to daily physical activity for a minimum of 30 minutes , and to commit weight loss. The fact that changes in all these areas will reduce or eliminate all together the development of diabetes is stressed.

## 2013-11-19 NOTE — Progress Notes (Signed)
Subjective:    Patient ID: Kristin Blackwell, female    DOB: [birth year], 63 y.o.   MRN: [id number]  HPI The PT is here for follow up and re-evaluation of chronic medical conditions, medication management and review of any available recent lab and radiology data. Labs are past due and not available Preventive health is updated, specifically  Cancer screening and Immunization. Need update on mammogram and I assume pt will obtain flu vaccine from her job, will specifically inquire about these when her labs are available The PT denies any adverse reactions to current medications since the last visit. Only takes loratidine, should be on aspirin, will need to follow up on this. She discontinued pravastatin since May, due to muscle cramps  C/o weight gain states this is due to smoking cessation, not happy about this, but not yet commited to daily exercise and needs to reduce calorie intake       Review of Systems See HPI Denies recent fever or chills. Denies sinus pressure, nasal congestion, ear pain or sore throat. Denies chest congestion, productive cough or wheezing. Denies chest pains, palpitations and leg swelling Denies abdominal pain, nausea, vomiting,diarrhea or constipation.   Denies dysuria, frequency, hesitancy or incontinence.  Denies headaches, seizures, numbness, or tingling. Denies depression, anxiety or insomnia. Denies skin break down or rash.        Objective:   Physical Exam BP 126/80  Pulse 78  Resp 18  Ht 5\' 6"  (1.676 m)  Wt 178 lb 1.3 oz (80.777 kg)  BMI 28.76 kg/m2  SpO2 95%  Patient alert and oriented and in no cardiopulmonary distress.  HEENT: No facial asymmetry, EOMI,   oropharynx pink and moist.  Neck supple no JVD, no mass.  Chest: Clear to auscultation bilaterally.  CVS: S1, S2 no murmurs, no S3.Regular rate.  ABD: Soft non tender.   Ext: No edema  MS: Adequate ROM spine, shoulders, hips and knees.  Skin: Intact, no ulcerations or  rash noted.  Psych: Good eye contact, normal affect. Memory intact not anxious or depressed appearing.  CNS: CN 2-12 intact, power,  normal throughout.no focal deficits noted.       Assessment & Plan:  Hyperlipidemia LDL goal <100 Hyperlipidemia:Low fat diet discussed and encouraged.  Updated lab needed , pt has stopped statin therapy since May.   Metabolic syndrome X The increased risk of cardiovascular disease associated with this diagnosis, and the need to consistently work on lifestyle to change this is discussed. Following  a  heart healthy diet ,commitment to 30 minutes of exercise at least 5 days per week, as well as control of blood sugar and cholesterol , and achieving a healthy weight are all the areas to be addressed .   Overweight Deteriorated. Patient re-educated about  the importance of commitment to a  minimum of 150 minutes of exercise per week. The importance of healthy food choices with portion control discussed. Pt attributes her 10  Lb weight gain in the past year to quitting nicotine, threatening to resume smoking , but not seriously so, she will work on lifestyle chnage.     Prediabetes Updated lab past due, will call p back when updated lab is available Patient educated about the importance of limiting  Carbohydrate intake , the need to commit to daily physical activity for a minimum of 30 minutes , and to commit weight loss. The fact that changes in all these areas will reduce or eliminate all together the development of diabetes is  stressed.      Pain of left thigh Intermittent disabling left thigh pain for over 6 months, sometimes awakens pt, no h/o trauma to area, denies radiation , no lower extremity weakness or numbness.no incontinence of stool or urine. Has bunion on right foot which she feels is the  likely the culprit, will follow through with initial recommended imaging studies of venous circulation and boine . I believe that mos likely cause is  nerve irritation in the spine , will hold off on this at this time, awaiting initial studies   Allergic rhinitis Stable and controlled on loratidine

## 2013-11-19 NOTE — Assessment & Plan Note (Signed)
Intermittent disabling left thigh pain for over 6 months, sometimes awakens pt, no h/o trauma to area, denies radiation , no lower extremity weakness or numbness.no incontinence of stool or urine. Has bunion on right foot which she feels is the  likely the culprit, will follow through with initial recommended imaging studies of venous circulation and boine . I believe that mos likely cause is nerve irritation in the spine , will hold off on this at this time, awaiting initial studies

## 2013-11-19 NOTE — Assessment & Plan Note (Signed)
Deteriorated. Patient re-educated about  the importance of commitment to a  minimum of 150 minutes of exercise per week. The importance of healthy food choices with portion control discussed. Pt attributes her 10  Lb weight gain in the past year to quitting nicotine, threatening to resume smoking , but not seriously so, she will work on lifestyle chnage.

## 2013-11-19 NOTE — Assessment & Plan Note (Signed)
The increased risk of cardiovascular disease associated with this diagnosis, and the need to consistently work on lifestyle to change this is discussed. Following  a  heart healthy diet ,commitment to 30 minutes of exercise at least 5 days per week, as well as control of blood sugar and cholesterol , and achieving a healthy weight are all the areas to be addressed .  

## 2013-11-19 NOTE — Assessment & Plan Note (Signed)
Stable and controlled on loratidine

## 2013-11-19 NOTE — Assessment & Plan Note (Signed)
Hyperlipidemia:Low fat diet discussed and encouraged.  Updated lab needed , pt has stopped statin therapy since May.

## 2013-11-22 LAB — HEMOGLOBIN A1C
Hgb A1c MFr Bld: 6.2 % — ABNORMAL HIGH (ref <5.7)
MEAN PLASMA GLUCOSE: 131 mg/dL — AB (ref <117)

## 2013-11-22 LAB — BASIC METABOLIC PANEL
BUN: 14 mg/dL (ref 6–23)
CO2: 26 meq/L (ref 19–32)
CREATININE: 0.88 mg/dL (ref 0.50–1.10)
Calcium: 9.3 mg/dL (ref 8.4–10.5)
Chloride: 105 mEq/L (ref 96–112)
Glucose, Bld: 99 mg/dL (ref 70–99)
POTASSIUM: 4.2 meq/L (ref 3.5–5.3)
SODIUM: 139 meq/L (ref 135–145)

## 2013-11-22 LAB — LIPID PANEL
CHOL/HDL RATIO: 5.3 ratio
Cholesterol: 247 mg/dL — ABNORMAL HIGH (ref 0–200)
HDL: 47 mg/dL (ref >39)
LDL Cholesterol: 173 mg/dL — ABNORMAL HIGH (ref 0–99)
TRIGLYCERIDES: 134 mg/dL (ref <150)
VLDL: 27 mg/dL (ref 0–40)

## 2013-11-24 ENCOUNTER — Encounter: Payer: Self-pay | Admitting: Family Medicine

## 2013-11-28 ENCOUNTER — Telehealth: Payer: Self-pay | Admitting: *Deleted

## 2013-11-28 NOTE — Telephone Encounter (Signed)
Called and left message for patient to call back re: crestor.

## 2013-11-28 NOTE — Telephone Encounter (Deleted)
Pt stated to call her back

## 2013-11-28 NOTE — Telephone Encounter (Signed)
Pt called stating she needs her cholesterol medication, per pt a message on her mychart stated to call the nurse, pt stated she called yesterday. Please advise

## 2013-12-04 ENCOUNTER — Other Ambulatory Visit: Payer: Self-pay

## 2013-12-04 MED ORDER — ROSUVASTATIN CALCIUM 10 MG PO TABS: 10.0000 mg | ORAL_TABLET | Freq: Every day | ORAL | Status: DC

## 2013-12-04 NOTE — Telephone Encounter (Signed)
Unable to reach patient with multiple call.   Letter sent

## 2014-01-17 ENCOUNTER — Other Ambulatory Visit: Payer: Self-pay

## 2014-01-17 ENCOUNTER — Telehealth: Payer: Self-pay

## 2014-01-17 MED ORDER — BENZONATATE 100 MG PO CAPS: 100.0000 mg | ORAL_CAPSULE | Freq: Three times a day (TID) | ORAL | Status: DC

## 2014-01-17 MED ORDER — AZITHROMYCIN 250 MG PO TABS: ORAL_TABLET | ORAL | Status: DC

## 2014-01-17 NOTE — Telephone Encounter (Signed)
pls send a z pack let herknow ifworsens , needs to go to urgent care  opr call for work in appt next week  pls also send tessalon perle 100mg  3 times daily as needed,  #30 as decongestant , and let her lnow

## 2014-01-17 NOTE — Telephone Encounter (Signed)
Patient states that she has cough/congestion with slightly yellow sputum.  She is having wheezing after coughing spells.  Would like to know if something can be sent in or does she need ov?   Please advise.

## 2014-01-17 NOTE — Telephone Encounter (Signed)
Pt aware and med sent  

## 2014-04-23 ENCOUNTER — Other Ambulatory Visit: Payer: Self-pay | Admitting: Family Medicine

## 2014-04-23 LAB — COMPREHENSIVE METABOLIC PANEL
ALT: 18 U/L (ref 0–35)
AST: 27 U/L (ref 0–37)
Albumin: 4 g/dL (ref 3.5–5.2)
Alkaline Phosphatase: 71 U/L (ref 39–117)
BILIRUBIN TOTAL: 0.2 mg/dL (ref 0.2–1.2)
BUN: 16 mg/dL (ref 6–23)
CO2: 26 meq/L (ref 19–32)
CREATININE: 0.87 mg/dL (ref 0.50–1.10)
Calcium: 9.7 mg/dL (ref 8.4–10.5)
Chloride: 104 mEq/L (ref 96–112)
Glucose, Bld: 92 mg/dL (ref 70–99)
Potassium: 4.5 mEq/L (ref 3.5–5.3)
Sodium: 138 mEq/L (ref 135–145)
Total Protein: 7.3 g/dL (ref 6.0–8.3)

## 2014-04-23 LAB — TSH: TSH: 1.549 u[IU]/mL (ref 0.350–4.500)

## 2014-04-23 LAB — CBC
HEMATOCRIT: 36.4 % (ref 36.0–46.0)
HEMOGLOBIN: 11.8 g/dL — AB (ref 12.0–15.0)
MCH: 27.1 pg (ref 26.0–34.0)
MCHC: 32.4 g/dL (ref 30.0–36.0)
MCV: 83.5 fL (ref 78.0–100.0)
Platelets: 337 10*3/uL (ref 150–400)
RBC: 4.36 MIL/uL (ref 3.87–5.11)
RDW: 15.7 % — ABNORMAL HIGH (ref 11.5–15.5)
WBC: 9.3 10*3/uL (ref 4.0–10.5)

## 2014-04-23 LAB — LIPID PANEL
CHOL/HDL RATIO: 2.9 ratio
Cholesterol: 140 mg/dL (ref 0–200)
HDL: 48 mg/dL (ref >=46)
LDL Cholesterol: 69 mg/dL (ref 0–99)
TRIGLYCERIDES: 116 mg/dL (ref <150)
VLDL: 23 mg/dL (ref 0–40)

## 2014-04-23 LAB — HEMOGLOBIN A1C
HEMOGLOBIN A1C: 6.4 % — AB (ref <5.7)
MEAN PLASMA GLUCOSE: 137 mg/dL — AB (ref <117)

## 2014-04-24 LAB — FERRITIN: Ferritin: 86 ng/mL (ref 10–291)

## 2014-04-24 LAB — IRON: IRON: 49 ug/dL (ref 42–145)

## 2014-04-25 ENCOUNTER — Encounter: Payer: Self-pay | Admitting: Family Medicine

## 2014-04-25 ENCOUNTER — Ambulatory Visit (INDEPENDENT_AMBULATORY_CARE_PROVIDER_SITE_OTHER): Payer: BC Managed Care – PPO | Admitting: Family Medicine

## 2014-04-25 VITALS — BP 124/72 | HR 66 | Resp 18 | Ht 65.0 in | Wt 179.1 lb

## 2014-04-25 DIAGNOSIS — J309 Allergic rhinitis, unspecified: Secondary | ICD-10-CM

## 2014-04-25 DIAGNOSIS — R7303 Prediabetes: Secondary | ICD-10-CM

## 2014-04-25 DIAGNOSIS — R7309 Other abnormal glucose: Secondary | ICD-10-CM | POA: Diagnosis not present

## 2014-04-25 DIAGNOSIS — E785 Hyperlipidemia, unspecified: Secondary | ICD-10-CM | POA: Diagnosis not present

## 2014-04-25 DIAGNOSIS — E8881 Metabolic syndrome: Secondary | ICD-10-CM

## 2014-04-25 DIAGNOSIS — E663 Overweight: Secondary | ICD-10-CM

## 2014-04-25 MED ORDER — MOMETASONE FUROATE 50 MCG/ACT NA SUSP: 2.0000 | Freq: Every day | NASAL | Status: DC

## 2014-04-25 NOTE — Patient Instructions (Addendum)
F/u in 4 month, call if you need me before   You ned to commit to daily exercise for at least 30 minutes  Pls work on weight loss , call for referral to diabetic educator in  Next 2 months, if you are not losisng weight as you should , so that your blood sugar improves  Your blood count is slightly low, start once daily multivitamin and we will call you with the result of your iron test if this low  Congrats, on excellent cholesterol and blood pressure,  And not SMOKING  pls work on weight losss, and daily exercise you are NEARLY diabetic   Non fasting cmp and EGFR, CBC, HBA1C ,

## 2014-04-25 NOTE — Progress Notes (Signed)
Subjective:    Patient ID: Kristin Blackwell, female    DOB: [birth year], 64 y.o.   MRN: [id number]  HPI The PT is here for follow up and re-evaluation of chronic medical conditions, medication management and review of any available recent lab and radiology data.  Preventive health is updated, specifically  Cancer screening and Immunization.   Questions or concerns regarding consultations or procedures which the PT has had in the interim are  addressed. The PT denies any adverse reactions to current medications since the last visit.  There are no new concerns.  C/o cold feeling on her back when she sleeps, states she throws off  The covers and sleeps on her stomach and she believes this is the problem Not exercising , and very distraught re weight and blood sugar states sh will not become diabetic       Review of Systems See HPI Denies recent fever or chills. Denies sinus pressure, nasal congestion, ear pain or sore throat. Denies chest congestion, productive cough or wheezing. Denies chest pains, palpitations and leg swelling Denies abdominal pain, nausea, vomiting,diarrhea or constipation.   Denies dysuria, frequency, hesitancy or incontinence. Denies joint pain, swelling and limitation in mobility. Denies headaches, seizures, numbness, or tingling. Denies depression, anxiety or insomnia. Denies skin break down or rash.        Objective:   Physical Exam BP 124/72 mmHg  Pulse 66  Resp 18  Ht 5\' 5"  (1.651 m)  Wt 179 lb 1.9 oz (81.248 kg)  BMI 29.81 kg/m2  SpO2 98% Patient alert and oriented and in no cardiopulmonary distress.  HEENT: No facial asymmetry, EOMI,   oropharynx pink and moist.  Neck supple no JVD, no mass.  Chest: Clear to auscultation bilaterally.  CVS: S1, S2 no murmurs, no S3.Regular rate.  ABD: Soft non tender.   Ext: No edema  MS: Adequate ROM spine, shoulders, hips and knees.  Skin: Intact, no ulcerations or rash noted.  Psych: Good eye  contact, normal affect. Memory intact not anxious or depressed appearing.  CNS: CN 2-12 intact, power,  normal throughout.no focal deficits noted.        Assessment & Plan:  Hyperlipidemia LDL goal <100 Improved and corrected at goal Hyperlipidemia:Low fat diet discussed and encouraged.  No med change   Metabolic syndrome X The increased risk of cardiovascular disease associated with this diagnosis, and the need to consistently work on lifestyle to change this is discussed. Following  a  heart healthy diet ,commitment to 30 minutes of exercise at least 5 days per week, as well as control of blood sugar and cholesterol , and achieving a healthy weight are all the areas to be addressed .    Overweight Deteriorated. Patient re-educated about  the importance of commitment to a  minimum of 150 minutes of exercise per week. The importance of healthy food choices with portion control discussed. Encouraged to start a food diary, count calories and to consider  joining a support group. Sample diet sheets offered. Goals set by the patient for the next several months.      Prediabetes Deteriorated Patient educated about the importance of limiting  Carbohydrate intake , the need to commit to daily physical activity for a minimum of 30 minutes , and to commit weight loss. The fact that changes in all these areas will reduce or eliminate all together the development of diabetes is stressed.   Updated lab needed at/ before next visit.    Allergic rhinitis  Uncontrolled, needs to commit to daily med use

## 2014-04-28 NOTE — Assessment & Plan Note (Signed)
The increased risk of cardiovascular disease associated with this diagnosis, and the need to consistently work on lifestyle to change this is discussed. Following  a  heart healthy diet ,commitment to 30 minutes of exercise at least 5 days per week, as well as control of blood sugar and cholesterol , and achieving a healthy weight are all the areas to be addressed .  

## 2014-04-28 NOTE — Assessment & Plan Note (Signed)
Deteriorated Patient educated about the importance of limiting  Carbohydrate intake , the need to commit to daily physical activity for a minimum of 30 minutes , and to commit weight loss. The fact that changes in all these areas will reduce or eliminate all together the development of diabetes is stressed.   \Updated lab needed at/ before next visit.  

## 2014-04-28 NOTE — Assessment & Plan Note (Signed)
Deteriorated. Patient re-educated about  the importance of commitment to a  minimum of 150 minutes of exercise per week. The importance of healthy food choices with portion control discussed. Encouraged to start a food diary, count calories and to consider  joining a support group. Sample diet sheets offered. Goals set by the patient for the next several months.    

## 2014-04-28 NOTE — Assessment & Plan Note (Signed)
Uncontrolled, needs to commit to daily med use

## 2014-04-28 NOTE — Assessment & Plan Note (Signed)
Improved and corrected at goal Hyperlipidemia:Low fat diet discussed and encouraged.  No med change

## 2014-07-10 ENCOUNTER — Other Ambulatory Visit: Payer: Self-pay

## 2014-07-10 ENCOUNTER — Other Ambulatory Visit: Payer: Self-pay | Admitting: Family Medicine

## 2014-07-10 ENCOUNTER — Encounter: Payer: Self-pay | Admitting: Family Medicine

## 2014-07-10 MED ORDER — ROSUVASTATIN CALCIUM 10 MG PO TABS: 10.0000 mg | ORAL_TABLET | Freq: Every day | ORAL | Status: DC

## 2014-08-24 ENCOUNTER — Other Ambulatory Visit: Payer: Self-pay | Admitting: Family Medicine

## 2014-08-24 LAB — HEMOGLOBIN A1C
Hgb A1c MFr Bld: 6.3 % — ABNORMAL HIGH (ref <5.7)
Mean Plasma Glucose: 134 mg/dL — ABNORMAL HIGH (ref <117)

## 2014-08-24 LAB — CBC
HEMATOCRIT: 37.6 % (ref 36.0–46.0)
HEMOGLOBIN: 12 g/dL (ref 12.0–15.0)
MCH: 26.7 pg (ref 26.0–34.0)
MCHC: 31.9 g/dL (ref 30.0–36.0)
MCV: 83.6 fL (ref 78.0–100.0)
MPV: 9.8 fL (ref 8.6–12.4)
Platelets: 310 10*3/uL (ref 150–400)
RBC: 4.5 MIL/uL (ref 3.87–5.11)
RDW: 15.8 % — ABNORMAL HIGH (ref 11.5–15.5)
WBC: 6.3 10*3/uL (ref 4.0–10.5)

## 2014-08-25 LAB — COMPLETE METABOLIC PANEL WITH GFR
ALK PHOS: 64 U/L (ref 39–117)
ALT: 17 U/L (ref 0–35)
AST: 18 U/L (ref 0–37)
Albumin: 4 g/dL (ref 3.5–5.2)
BILIRUBIN TOTAL: 0.4 mg/dL (ref 0.2–1.2)
BUN: 14 mg/dL (ref 6–23)
CO2: 24 mEq/L (ref 19–32)
Calcium: 9.2 mg/dL (ref 8.4–10.5)
Chloride: 107 mEq/L (ref 96–112)
Creat: 0.71 mg/dL (ref 0.50–1.10)
GFR, Est African American: >89 mL/min
GFR, Est Non African American: >89 mL/min
Glucose, Bld: 92 mg/dL (ref 70–99)
POTASSIUM: 4.4 meq/L (ref 3.5–5.3)
SODIUM: 141 meq/L (ref 135–145)
Total Protein: 6.9 g/dL (ref 6.0–8.3)

## 2014-08-27 ENCOUNTER — Ambulatory Visit (INDEPENDENT_AMBULATORY_CARE_PROVIDER_SITE_OTHER): Payer: BC Managed Care – PPO | Admitting: Family Medicine

## 2014-08-27 ENCOUNTER — Telehealth: Payer: Self-pay | Admitting: *Deleted

## 2014-08-27 ENCOUNTER — Encounter: Payer: Self-pay | Admitting: Family Medicine

## 2014-08-27 VITALS — BP 124/82 | HR 66 | Resp 16 | Ht 65.0 in | Wt 181.0 lb

## 2014-08-27 DIAGNOSIS — R5382 Chronic fatigue, unspecified: Secondary | ICD-10-CM | POA: Diagnosis not present

## 2014-08-27 DIAGNOSIS — Z87891 Personal history of nicotine dependence: Secondary | ICD-10-CM

## 2014-08-27 DIAGNOSIS — E785 Hyperlipidemia, unspecified: Secondary | ICD-10-CM | POA: Diagnosis not present

## 2014-08-27 DIAGNOSIS — E8881 Metabolic syndrome: Secondary | ICD-10-CM

## 2014-08-27 DIAGNOSIS — R7303 Prediabetes: Secondary | ICD-10-CM

## 2014-08-27 DIAGNOSIS — R7309 Other abnormal glucose: Secondary | ICD-10-CM

## 2014-08-27 DIAGNOSIS — E669 Obesity, unspecified: Secondary | ICD-10-CM

## 2014-08-27 DIAGNOSIS — R0683 Snoring: Secondary | ICD-10-CM | POA: Diagnosis not present

## 2014-08-27 DIAGNOSIS — G4733 Obstructive sleep apnea (adult) (pediatric): Secondary | ICD-10-CM | POA: Insufficient documentation

## 2014-08-27 NOTE — Assessment & Plan Note (Signed)
Improved Patient educated about the importance of limiting  Carbohydrate intake , the need to commit to daily physical activity for a minimum of 30 minutes , and to commit weight loss. The fact that changes in all these areas will reduce or eliminate all together the development of diabetes is stressed.   Diabetic Labs Latest Ref Rng 08/24/2014 04/23/2014 11/22/2013 07/07/2013 01/27/2013  HbA1c <5.7 % 6.3(H) 6.4(H) 6.2(H) 6.2(H) 6.3(H)  Chol 0 - 200 mg/dL - 140 247(H) 194 175  HDL >=46 mg/dL - 48 47 51 48  Calc LDL 0 - 99 mg/dL - 69 173(H) 128(H) 104(H)  Triglycerides <150 mg/dL - 116 134 77 113  Creatinine 0.50 - 1.10 mg/dL 0.71 0.87 0.88 0.84 0.82   BP/Weight 08/27/2014 04/25/2014 11/15/2013 07/04/2013 01/24/2013 09/20/2012 4/78/2956  Systolic BP 213 086 578 469 629 528 413  Diastolic BP 82 72 80 72 72 72 82  Wt. (Lbs) 181 179.12 178.08 175 172.12 170 173  BMI 30.12 29.81 28.76 28.26 27.79 27.45 27.94   No flowsheet data found.   Updated lab needed at/ before next visit.

## 2014-08-27 NOTE — Telephone Encounter (Signed)
[facility] phone number is [phone].

## 2014-08-27 NOTE — Assessment & Plan Note (Signed)
The increased risk of cardiovascular disease associated with this diagnosis, and the need to consistently work on lifestyle to change this is discussed. Following  a  heart healthy diet ,commitment to 30 minutes of exercise at least 5 days per week, as well as control of blood sugar and cholesterol , and achieving a healthy weight are all the areas to be addressed .  

## 2014-08-27 NOTE — Telephone Encounter (Signed)
I called [facility] the next available is 12/18/14 with Dr. Annamaria Boots at 9:00, pt is scheduled for this appointment, I tried to call patient to make her aware no answer LMOM, Wilburn Cornelia stated pt can call for cancellations or if any of the provider's schedules changes they make can get her in sooner.

## 2014-08-27 NOTE — Assessment & Plan Note (Signed)
Controlled, no change in medication Hyperlipidemia:Low fat diet discussed and encouraged.   Lipid Panel  Lab Results  Component Value Date   CHOL 140 04/23/2014   HDL 48 04/23/2014   LDLCALC 69 04/23/2014   TRIG 116 04/23/2014   CHOLHDL 2.9 04/23/2014       .

## 2014-08-27 NOTE — Assessment & Plan Note (Signed)
Pt applauded on her continued nicotine cessation

## 2014-08-27 NOTE — Assessment & Plan Note (Signed)
Excessive snoring, and breath holding while asleep, chronic fatigue and daytime sleepiness, needs pulmonary eval to assess sleep apnea and have this treated

## 2014-08-27 NOTE — Assessment & Plan Note (Signed)
Deteriorated. Patient re-educated about  the importance of commitment to a  minimum of 150 minutes of exercise per week.  The importance of healthy food choices with portion control discussed. Encouraged to start a food diary, count calories and to consider  joining a support group. Sample diet sheets offered. Goals set by the patient for the next several months.   Weight /BMI 08/27/2014 04/25/2014 11/15/2013  WEIGHT 181 lb 179 lb 1.9 oz 178 lb 1.3 oz  HEIGHT 5\' 5"  5\' 5"  5\' 6"   BMI 30.12 kg/m2 29.81 kg/m2 28.76 kg/m2    Current exercise per week 40 minutes.

## 2014-08-27 NOTE — Patient Instructions (Signed)
F/u early December, call if you need me before  CONGRATS on improved blood sugar, and also continuing to not smoke , 2 years the 31 th of JUly   It is important that you exercise regularly at least 30 minutes 7 times a week. If you develop chest pain, have severe difficulty breathing, or feel very tired, stop exercising immediately and seek medical attention   PLEASE reduce nuts , they have a lot of calories and fat, this will  Help with weight loss Fasting lipid, , hBA1C, ,  In 4 month  You are referred  To lung specialist about sleep apnea, you will be contacted with appt info  Please work on good  health habits so that your health will improve. 1. Commitment to daily physical activity for 30 to 60  minutes, if you are able to do this.  2. Commitment to wise food choices. Aim for half of your  food intake to be vegetable and fruit, one quarter starchy foods, and one quarter protein. Try to eat on a regular schedule  3 meals per day, snacking between meals should be limited to vegetables or fruits or small portions of nuts. 64 ounces of water per day is generally recommended, unless you have specific health conditions, like heart failure or kidney failure where you will need to limit fluid intake.  3. Commitment to sufficient and a  good quality of physical and mental rest daily, generally between 6 to 8 hours per day.  WITH PERSISTANCE AND PERSEVERANCE, THE IMPOSSIBLE , BECOMES THE NORM!  Thanks for choosing [facility], we consider it a privelige to serve you.

## 2014-08-27 NOTE — Progress Notes (Signed)
Subjective:    Patient ID: Kristin Blackwell, female    DOB: 1950/11/27, 64 y.o.   MRN: [id number]  HPI The PT is here for follow up and re-evaluation of chronic medical conditions, medication management and review of any available recent lab and radiology data.  Preventive health is updated, specifically  Cancer screening and Immunization.   . The PT denies any adverse reactions to current medications since the last visit.  C/o excess snoring and fatigue, wants sleep study done Frustrated with weight gain, but insufficient to decide on nutrition referral states she will check her job, still not exercising but intends to start      Review of Systems    See HPI Denies recent fever or chills. Denies sinus pressure, nasal congestion, ear pain or sore throat. Denies chest congestion, productive cough or wheezing. Denies chest pains, palpitations and leg swelling Denies abdominal pain, nausea, vomiting,diarrhea or constipation.   Denies dysuria, frequency, hesitancy or incontinence. Denies joint pain, swelling and limitation in mobility. Denies headaches, seizures, numbness, or tingling. Denies depression, anxiety or insomnia. Denies skin break down or rash.     Objective:   Physical Exam BP 124/82 mmHg  Pulse 66  Resp 16  Ht 5\' 5"  (1.651 m)  Wt 181 lb (82.101 kg)  BMI 30.12 kg/m2  SpO2 97% Patient alert and oriented and in no cardiopulmonary distress.  HEENT: No facial asymmetry, EOMI,   oropharynx pink and moist.  Neck supple no JVD, no mass.  Chest: Clear to auscultation bilaterally.  CVS: S1, S2 no murmurs, no S3.Regular rate.  ABD: Soft non tender.   Ext: No edema  MS: Adequate ROM spine, shoulders, hips and knees.  Skin: Intact, no ulcerations or rash noted.  Psych: Good eye contact, normal affect. Memory intact not anxious or depressed appearing.  CNS: CN 2-12 intact, power,  normal throughout.no focal deficits noted.        Assessment & Plan:    Prediabetes Improved Patient educated about the importance of limiting  Carbohydrate intake , the need to commit to daily physical activity for a minimum of 30 minutes , and to commit weight loss. The fact that changes in all these areas will reduce or eliminate all together the development of diabetes is stressed.   Diabetic Labs Latest Ref Rng 08/24/2014 04/23/2014 11/22/2013 07/07/2013 01/27/2013  HbA1c <5.7 % 6.3(H) 6.4(H) 6.2(H) 6.2(H) 6.3(H)  Chol 0 - 200 mg/dL - 140 247(H) 194 175  HDL >=46 mg/dL - 48 47 51 48  Calc LDL 0 - 99 mg/dL - 69 173(H) 128(H) 104(H)  Triglycerides <150 mg/dL - 116 134 77 113  Creatinine 0.50 - 1.10 mg/dL 0.71 0.87 0.88 0.84 0.82   BP/Weight 08/27/2014 04/25/2014 11/15/2013 07/04/2013 01/24/2013 09/20/2012 7/82/9562  Systolic BP 130 865 784 696 295 284 132  Diastolic BP 82 72 80 72 72 72 82  Wt. (Lbs) 181 179.12 178.08 175 172.12 170 173  BMI 30.12 29.81 28.76 28.26 27.79 27.45 27.94   No flowsheet data found.   Updated lab needed at/ before next visit.      Snoring Excessive snoring, and breath holding while asleep, chronic fatigue and daytime sleepiness, needs pulmonary eval to assess sleep apnea and have this treated  H/O nicotine dependence Pt applauded on her continued nicotine cessation   Obesity (BMI 30.0-34.9) Deteriorated. Patient re-educated about  the importance of commitment to a  minimum of 150 minutes of exercise per week.  The importance of healthy food choices  with portion control discussed. Encouraged to start a food diary, count calories and to consider  joining a support group. Sample diet sheets offered. Goals set by the patient for the next several months.   Weight /BMI 08/27/2014 04/25/2014 11/15/2013  WEIGHT 181 lb 179 lb 1.9 oz 178 lb 1.3 oz  HEIGHT 5\' 5"  5\' 5"  5\' 6"   BMI 30.12 kg/m2 29.81 kg/m2 28.76 kg/m2    Current exercise per week 40 minutes.   Hyperlipidemia LDL goal <100 Controlled, no change in  medication Hyperlipidemia:Low fat diet discussed and encouraged.   Lipid Panel  Lab Results  Component Value Date   CHOL 140 04/23/2014   HDL 48 04/23/2014   LDLCALC 69 04/23/2014   TRIG 116 04/23/2014   CHOLHDL 2.9 04/23/2014       .   Metabolic syndrome X The increased risk of cardiovascular disease associated with this diagnosis, and the need to consistently work on lifestyle to change this is discussed. Following  a  heart healthy diet ,commitment to 30 minutes of exercise at least 5 days per week, as well as control of blood sugar and cholesterol , and achieving a healthy weight are all the areas to be addressed .

## 2014-08-28 ENCOUNTER — Encounter: Payer: Self-pay | Admitting: Family Medicine

## 2014-08-28 LAB — HIV ANTIBODY (ROUTINE TESTING W REFLEX): HIV 1&2 Ab, 4th Generation: NONREACTIVE

## 2014-08-28 NOTE — Telephone Encounter (Signed)
I called patient and made her aware of her appt with Dr. Annamaria Boots 12/18/14 at 9:00.

## 2014-08-29 NOTE — Telephone Encounter (Signed)
noted, thanks

## 2014-12-18 ENCOUNTER — Ambulatory Visit (INDEPENDENT_AMBULATORY_CARE_PROVIDER_SITE_OTHER): Payer: BC Managed Care – PPO | Admitting: Internal Medicine

## 2014-12-18 ENCOUNTER — Encounter: Payer: Self-pay | Admitting: Internal Medicine

## 2014-12-18 VITALS — BP 126/68 | HR 70 | Ht 66.0 in | Wt 182.0 lb

## 2014-12-18 DIAGNOSIS — K219 Gastro-esophageal reflux disease without esophagitis: Secondary | ICD-10-CM | POA: Diagnosis not present

## 2014-12-18 DIAGNOSIS — G4733 Obstructive sleep apnea (adult) (pediatric): Secondary | ICD-10-CM

## 2014-12-18 DIAGNOSIS — R0683 Snoring: Secondary | ICD-10-CM | POA: Diagnosis not present

## 2014-12-18 NOTE — Patient Instructions (Addendum)
Order- Schedule unattended home sleep test    Dx OSA   We will go over the results after your sleep test and discuss if there are treatment recommendations.

## 2014-12-18 NOTE — Assessment & Plan Note (Signed)
High pretest probability for obstructive sleep apnea Education done Plan-schedule sleep study

## 2014-12-18 NOTE — Progress Notes (Signed)
12/18/14- 83 yoF former smoker Referred by Dr. Moshe Cipro for snoring X several years.  has not had sleep study.  Epworth score 9 Medical problems include obesity, LBBB She is told that she snores loudly and gasps for breath. Recognized one episode of reflux but occasionally wakes coughing. Daytime sleepiness on weekends especially when she can relax after breakfast. Restless sleep, tossing turn at times. No sleep medicines. One cup of coffee. No ENT surgery, no lung disease. Treated for tuberculosis 1 year in the 1970s-no detail. Quit smoking 2014. Working day job as a Pharmacist, community. Brother with obstructive sleep apnea.  Prior to Admission medications   Medication Sig Start Date End Date Taking? Authorizing Provider  loratadine (CLARITIN) 10 MG tablet Take 10 mg by mouth daily as needed for allergies.   Yes Historical Provider, MD  pseudoephedrine-acetaminophen (TYLENOL SINUS) 30-500 MG TABS tablet Take 1 tablet by mouth every 4 (four) hours as needed.   Yes Historical Provider, MD  rosuvastatin (CRESTOR) 10 MG tablet Take 1 tablet (10 mg total) by mouth daily. 07/10/14  Yes Fayrene Helper, MD   Past Medical History  Diagnosis Date  . Nicotine addiction   . Overweight(278.02)    Past Surgical History  Procedure Laterality Date  . Partial hysterectomy  1987    had one ovary removed    Family History  Problem Relation Age of Onset  . Hypertension Mother   . Emphysema Father   . Hypertension Sister   . Diabetes Brother     oldest    Social History   Social History  . Marital Status: Married    Spouse Name: N/A  . Number of Children: N/A  . Years of Education: N/A   Occupational History  . Librarian at [employer] History Main Topics  . Smoking status: Former Smoker -- 1.00 packs/day    Types: Cigarettes  . Smokeless tobacco: Not on file     Comment: 1 week ago   . Alcohol Use: No  . Drug Use: No  . Sexual Activity: Not on file   Other Topics  Concern  . Not on file   Social History Narrative   Cell [phone]   ROS-see HPI   Negative unless "+" Constitutional:    weight loss, night sweats, fevers, chills, + fatigue, lassitude. HEENT:    headaches, difficulty swallowing, tooth/dental problems, sore throat,       sneezing, itching, ear ache, nasal congestion, post nasal drip, snoring CV:    chest pain, orthopnea, PND, swelling in lower extremities, anasarca,                                               dizziness, palpitations Resp:   shortness of breath with exertion or at rest.                productive cough,   + non-productive cough, coughing up of blood.              change in color of mucus.  wheezing.   Skin:    rash or lesions. GI:  + heartburn, indigestion, abdominal pain, nausea, vomiting, e GU: . MS:   joint pain, stiffness, decreased range of motion, back pain. Neuro-     nothing unusual Psych:  change in mood or affect.  depression or anxiety.   memory  loss.  OBJ- Physical Exam General- Alert, Oriented, Affect-appropriate, Distress- none acute + overweight Skin- rash-none, lesions- none, excoriation- none Lymphadenopathy- none Head- atraumatic            Eyes- Gross vision intact, PERRLA, conjunctivae and secretions clear            Ears- Hearing, canals-normal            Nose- Clear, no-Septal dev, mucus, polyps, erosion, perforation             Throat- Mallampati IV , mucosa clear , drainage- none, tonsils- atrophic Neck- flexible , trachea midline, no stridor , thyroid nl, carotid no bruit Chest - symmetrical excursion , unlabored           Heart/CV- RRR , no murmur , no gallop  , no rub, nl s1 s2                           - JVD- none , edema- none, stasis changes- none, varices- none           Lung- clear to P&A, wheeze- none, cough- none , dullness-none, rub- none           Chest wall-  Abd-  Br/ Gen/ Rectal- Not done, not indicated Extrem- cyanosis- none, clubbing, none, atrophy- none, strength-  nl Neuro- grossly intact to observation

## 2014-12-18 NOTE — Assessment & Plan Note (Signed)
She is aware of indigestion/heartburn and has awakened at least one time aware of acid burning into her throat. Plan-she will discuss this with her primary physician. Reflux precautions

## 2015-01-07 DIAGNOSIS — G4733 Obstructive sleep apnea (adult) (pediatric): Secondary | ICD-10-CM | POA: Diagnosis not present

## 2015-01-08 ENCOUNTER — Other Ambulatory Visit: Payer: Self-pay | Admitting: *Deleted

## 2015-01-08 DIAGNOSIS — G4733 Obstructive sleep apnea (adult) (pediatric): Secondary | ICD-10-CM

## 2015-01-17 ENCOUNTER — Other Ambulatory Visit: Payer: Self-pay | Admitting: Family Medicine

## 2015-01-17 ENCOUNTER — Telehealth: Payer: Self-pay | Admitting: Family Medicine

## 2015-01-17 MED ORDER — AZITHROMYCIN 250 MG PO TABS: ORAL_TABLET | ORAL | Status: DC

## 2015-01-17 NOTE — Telephone Encounter (Signed)
Medication is sent pls let her know, if worsen call for appt next week

## 2015-01-17 NOTE — Telephone Encounter (Signed)
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

## 2015-01-23 ENCOUNTER — Encounter: Payer: Self-pay | Admitting: Internal Medicine

## 2015-01-23 ENCOUNTER — Ambulatory Visit (INDEPENDENT_AMBULATORY_CARE_PROVIDER_SITE_OTHER): Payer: BC Managed Care – PPO | Admitting: Internal Medicine

## 2015-01-23 VITALS — BP 114/68 | HR 70 | Ht 66.0 in | Wt 181.4 lb

## 2015-01-23 DIAGNOSIS — G4733 Obstructive sleep apnea (adult) (pediatric): Secondary | ICD-10-CM

## 2015-01-23 DIAGNOSIS — E669 Obesity, unspecified: Secondary | ICD-10-CM

## 2015-01-23 NOTE — Assessment & Plan Note (Signed)
Treatment options were reviewed. CPAP was the best choice for her. Plan-begin CPAP therapy AutoPap 5-15. Educated on compliance rules.

## 2015-01-23 NOTE — Assessment & Plan Note (Signed)
Explained importance of weight loss for helping manage sleep apnea

## 2015-01-23 NOTE — Progress Notes (Signed)
12/18/14- 82 yoF former smoker Referred by Dr. Moshe Cipro for snoring X several years.  has not had sleep study.  Epworth score 9 Medical problems include obesity, LBBB She is told that she snores loudly and gasps for breath. Recognized one episode of reflux but occasionally wakes coughing. Daytime sleepiness on weekends especially when she can relax after breakfast. Restless sleep, tossing turn at times. No sleep medicines. One cup of coffee. No ENT surgery, no lung disease. Treated for tuberculosis 1 year in the 1970s-no detail. Quit smoking 2014. Working day job as a Pharmacist, community. Brother with obstructive sleep apnea.  01/23/2015-65 year old female former smoker followed for OSA, complicated by obesity, GERD FOLLOWS FOR: Pt here to review HST test; report printed and attached. Unattended HST 01/07/15- AHI 25.3/ hr, with desaturation to 77%, body weight 182 pounds I reviewed her sleep study with her, discussing medical and treatment issues. She is very tentative but willing to try CPAP. Alternatives of oral appliance and surgery evaluation discussed.  ROS-see HPI   Negative unless "+" Constitutional:    weight loss, night sweats, fevers, chills, + fatigue, lassitude. HEENT:    headaches, difficulty swallowing, tooth/dental problems, sore throat,       sneezing, itching, ear ache, nasal congestion, post nasal drip, snoring CV:    chest pain, orthopnea, PND, swelling in lower extremities, anasarca,                                               dizziness, palpitations Resp:   shortness of breath with exertion or at rest.                productive cough,   + non-productive cough, coughing up of blood.              change in color of mucus.  wheezing.   Skin:    rash or lesions. GI:  + heartburn, indigestion, abdominal pain, nausea, vomiting, e GU: . MS:   joint pain, stiffness, decreased range of motion, back pain. Neuro-     nothing unusual Psych:  change in mood or affect.  depression or  anxiety.   memory loss.  OBJ- Physical Exam General- Alert, Oriented, Affect-appropriate, Distress- none acute + overweight Skin- rash-none, lesions- none, excoriation- none Lymphadenopathy- none Head- atraumatic            Eyes- Gross vision intact, PERRLA, conjunctivae and secretions clear            Ears- Hearing, canals-normal            Nose- Clear, no-Septal dev, mucus, polyps, erosion, perforation             Throat- Mallampati IV , mucosa clear , drainage- none, tonsils- atrophic Neck- flexible , trachea midline, no stridor , thyroid nl, carotid no bruit Chest - symmetrical excursion , unlabored           Heart/CV- RRR , no murmur , no gallop  , no rub, nl s1 s2                           - JVD- none , edema- none, stasis changes- none, varices- none           Lung- clear to P&A, wheeze- none, cough- none , dullness-none, rub- none  Chest wall-  Abd-  Br/ Gen/ Rectal- Not done, not indicated Extrem- cyanosis- none, clubbing, none, atrophy- none, strength- nl Neuro- grossly intact to observation

## 2015-01-23 NOTE — Patient Instructions (Signed)
Order- new DME, new CPAP auto 5-15, mask of choice, humidifier, supplies, AirView    Dx OSA  Please call as needed       

## 2015-01-29 ENCOUNTER — Ambulatory Visit (INDEPENDENT_AMBULATORY_CARE_PROVIDER_SITE_OTHER): Payer: BC Managed Care – PPO | Admitting: Family Medicine

## 2015-01-29 ENCOUNTER — Encounter: Payer: Self-pay | Admitting: Family Medicine

## 2015-01-29 VITALS — BP 118/74 | HR 80 | Resp 16 | Ht 66.0 in | Wt 180.0 lb

## 2015-01-29 DIAGNOSIS — R7302 Impaired glucose tolerance (oral): Secondary | ICD-10-CM | POA: Diagnosis not present

## 2015-01-29 DIAGNOSIS — G4733 Obstructive sleep apnea (adult) (pediatric): Secondary | ICD-10-CM

## 2015-01-29 DIAGNOSIS — N3001 Acute cystitis with hematuria: Secondary | ICD-10-CM | POA: Diagnosis not present

## 2015-01-29 DIAGNOSIS — E785 Hyperlipidemia, unspecified: Secondary | ICD-10-CM

## 2015-01-29 DIAGNOSIS — E8881 Metabolic syndrome: Secondary | ICD-10-CM

## 2015-01-29 DIAGNOSIS — Z1159 Encounter for screening for other viral diseases: Secondary | ICD-10-CM | POA: Diagnosis not present

## 2015-01-29 DIAGNOSIS — E663 Overweight: Secondary | ICD-10-CM

## 2015-01-29 DIAGNOSIS — R7303 Prediabetes: Secondary | ICD-10-CM

## 2015-01-29 LAB — POCT URINALYSIS DIPSTICK
BILIRUBIN UA: NEGATIVE
GLUCOSE UA: NEGATIVE
Ketones, UA: NEGATIVE
NITRITE UA: POSITIVE
Protein, UA: 100
Spec Grav, UA: 1.025
Urobilinogen, UA: 0.2
pH, UA: 7.5

## 2015-01-29 MED ORDER — CIPROFLOXACIN HCL 500 MG PO TABS: 500.0000 mg | ORAL_TABLET | Freq: Two times a day (BID) | ORAL | Status: DC

## 2015-01-29 MED ORDER — SULFAMETHOXAZOLE-TRIMETHOPRIM 800-160 MG PO TABS: 1.0000 | ORAL_TABLET | Freq: Two times a day (BID) | ORAL | Status: DC

## 2015-01-29 NOTE — Patient Instructions (Signed)
F/u in 5 month call if you need me before  You have a urinary tract infection, 3  Day course of septra sent  All the best for 2017!   Thanks for choosing [facility], we consider it a privelige to serve you.  Fasting labs in next 1 to 2 weeks please  It is important that you exercise regularly at least 30 minutes 7 times a week. If you develop chest pain, have severe difficulty breathing, or feel very tired, stop exercising immediately and seek medical attention

## 2015-01-29 NOTE — Progress Notes (Signed)
Subjective:    Patient ID: Kristin Blackwell, female    DOB: [birth year], 64 y.o.   MRN: [id number]  HPI   Kristin Blackwell     MRN: [id number]      DOB: 09-30-1950   HPI Kristin Blackwell is here for follow up and re-evaluation of chronic medical conditions, medication management and review of any available recent lab and radiology data.  Preventive health is updated, specifically  Cancer screening and Immunization.   Questions or concerns regarding consultations or procedures which the PT has had in the interim are  Addressed.Has a diagnosis of sleep apnea, established, financially , holding off on CPAP equipment until January The PT denies any adverse reactions to current medications since the last visit.  3 day h/o dysuria , and frequency, denies flank pain or nauseaThere are no specific complaints   ROS Denies recent fever or chills. Denies sinus pressure, nasal congestion, ear pain or sore throat. Denies chest congestion, productive cough or wheezing. Denies chest pains, palpitations and leg swelling Denies abdominal pain, nausea, vomiting,diarrhea or constipation.    Denies joint pain, swelling and limitation in mobility. Denies headaches, seizures, numbness, or tingling. Denies depression, anxiety or insomnia. Denies skin break down or rash.   PE  BP 118/74 mmHg  Pulse 80  Resp 16  Ht 5\' 6"  (1.676 m)  Wt 180 lb (81.647 kg)  BMI 29.07 kg/m2  SpO2 98%  Patient alert and oriented and in no cardiopulmonary distress.  HEENT: No facial asymmetry, EOMI,   oropharynx pink and moist.  Neck supple no JVD, no mass.  Chest: Clear to auscultation bilaterally.  CVS: S1, S2 no murmurs, no S3.Regular rate.  ABD: Soft non tender. No renal angle or suprapubic tenderness  Ext: No edema  MS: Adequate ROM spine, shoulders, hips and knees.  Skin: Intact, no ulcerations or rash noted.  Psych: Good eye contact, normal affect. Memory intact not anxious or depressed  appearing.  CNS: CN 2-12 intact, power,  normal throughout.no focal deficits noted.   Assessment & Plan  Acute cystitis with hematuria Symptomatic with abnormal UA , 3 day course of septra prescribed  Hyperlipidemia LDL goal <100 Hyperlipidemia:Low fat diet discussed and encouraged.   Lipid Panel  Lab Results  Component Value Date   CHOL 140 04/23/2014   HDL 48 04/23/2014   LDLCALC 69 04/23/2014   TRIG 116 04/23/2014   CHOLHDL 2.9 04/23/2014      Updated lab needed .   Obstructive sleep apnea Will start using CPAP in January, 2017 for financial reasons , holding off until that time  Overweight (BMI 25.0-29.9) Unchanged. Patient re-educated about  the importance of commitment to a  minimum of 150 minutes of exercise per week.  The importance of healthy food choices with portion control discussed. Encouraged to start a food diary, count calories and to consider  joining a support group. Sample diet sheets offered. Goals set by the patient for the next several months.   Weight /BMI 01/29/2015 01/23/2015 12/18/2014  WEIGHT 180 lb 181 lb 6.4 oz 182 lb  HEIGHT 5\' 6"  5\' 6"  5\' 6"   BMI 29.07 kg/m2 29.29 kg/m2 29.39 kg/m2    Current exercise per week 30 mins   Prediabetes Patient educated about the importance of limiting  Carbohydrate intake , the need to commit to daily physical activity for a minimum of 30 minutes , and to commit weight loss. The fact that changes in all these areas will reduce or eliminate all  together the development of diabetes is stressed.   Diabetic Labs Latest Ref Rng 08/24/2014 04/23/2014 11/22/2013 07/07/2013 01/27/2013  HbA1c <5.7 % 6.3(H) 6.4(H) 6.2(H) 6.2(H) 6.3(H)  Chol 0 - 200 mg/dL - 140 247(H) 194 175  HDL >=46 mg/dL - 48 47 51 48  Calc LDL 0 - 99 mg/dL - 69 173(H) 128(H) 104(H)  Triglycerides <150 mg/dL - 116 134 77 113  Creatinine 0.50 - 1.10 mg/dL 0.71 0.87 0.88 0.84 0.82   BP/Weight 01/29/2015 01/23/2015 12/18/2014 08/27/2014 04/25/2014  11/15/2013 123456  Systolic BP 123456 [id number]  Diastolic BP 74 68 68 82 72 80 72  Wt. (Lbs) 180 181.4 182 181 179.12 178.08 175  BMI 29.07 29.29 29.39 30.12 29.81 28.76 28.26   No flowsheet data found. Updated lab needed at/ before next visit.     Metabolic syndrome X The increased risk of cardiovascular disease associated with this diagnosis, and the need to consistently work on lifestyle to change this is discussed. Following  a  heart healthy diet ,commitment to 30 minutes of exercise at least 5 days per week, as well as control of blood sugar and cholesterol , and achieving a healthy weight are all the areas to be addressed .         Review of Systems     Objective:   Physical Exam        Assessment & Plan:

## 2015-01-29 NOTE — Assessment & Plan Note (Signed)
Symptomatic with abnormal UA , 3 day course of septra prescribed

## 2015-01-29 NOTE — Assessment & Plan Note (Signed)
Will start using CPAP in January, 2017 for financial reasons , holding off until that time

## 2015-01-29 NOTE — Assessment & Plan Note (Signed)
Hyperlipidemia:Low fat diet discussed and encouraged.   Lipid Panel  Lab Results  Component Value Date   CHOL 140 04/23/2014   HDL 48 04/23/2014   LDLCALC 69 04/23/2014   TRIG 116 04/23/2014   CHOLHDL 2.9 04/23/2014      Updated lab needed .

## 2015-01-30 NOTE — Assessment & Plan Note (Signed)
Patient educated about the importance of limiting  Carbohydrate intake , the need to commit to daily physical activity for a minimum of 30 minutes , and to commit weight loss. The fact that changes in all these areas will reduce or eliminate all together the development of diabetes is stressed.   Diabetic Labs Latest Ref Rng 08/24/2014 04/23/2014 11/22/2013 07/07/2013 01/27/2013  HbA1c <5.7 % 6.3(H) 6.4(H) 6.2(H) 6.2(H) 6.3(H)  Chol 0 - 200 mg/dL - 140 247(H) 194 175  HDL >=46 mg/dL - 48 47 51 48  Calc LDL 0 - 99 mg/dL - 69 173(H) 128(H) 104(H)  Triglycerides <150 mg/dL - 116 134 77 113  Creatinine 0.50 - 1.10 mg/dL 0.71 0.87 0.88 0.84 0.82   BP/Weight 01/29/2015 01/23/2015 12/18/2014 08/27/2014 04/25/2014 11/15/2013 123456  Systolic BP 123456 [id number] A999333 A999333 [id number]  Diastolic BP 74 68 68 82 72 80 72  Wt. (Lbs) 180 181.4 182 181 179.12 178.08 175  BMI 29.07 29.29 29.39 30.12 29.81 28.76 28.26   No flowsheet data found. Updated lab needed at/ before next visit.

## 2015-01-30 NOTE — Assessment & Plan Note (Signed)
Unchanged. Patient re-educated about  the importance of commitment to a  minimum of 150 minutes of exercise per week.  The importance of healthy food choices with portion control discussed. Encouraged to start a food diary, count calories and to consider  joining a support group. Sample diet sheets offered. Goals set by the patient for the next several months.   Weight /BMI 01/29/2015 01/23/2015 12/18/2014  WEIGHT 180 lb 181 lb 6.4 oz 182 lb  HEIGHT 5\' 6"  5\' 6"  5\' 6"   BMI 29.07 kg/m2 29.29 kg/m2 29.39 kg/m2    Current exercise per week 30 mins

## 2015-01-30 NOTE — Assessment & Plan Note (Signed)
The increased risk of cardiovascular disease associated with this diagnosis, and the need to consistently work on lifestyle to change this is discussed. Following  a  heart healthy diet ,commitment to 30 minutes of exercise at least 5 days per week, as well as control of blood sugar and cholesterol , and achieving a healthy weight are all the areas to be addressed .  

## 2015-03-26 ENCOUNTER — Telehealth: Payer: Self-pay | Admitting: Internal Medicine

## 2015-03-26 NOTE — Telephone Encounter (Signed)
ATC pt and line rings busy x 3 WCB 

## 2015-03-27 NOTE — Telephone Encounter (Signed)
Pt returning call.Kristin Blackwell ° °

## 2015-03-27 NOTE — Telephone Encounter (Signed)
LVM for patient to return call. 

## 2015-03-27 NOTE — Telephone Encounter (Signed)
lmomtcb x1 

## 2015-03-28 NOTE — Telephone Encounter (Signed)
Called spoke with pt. She has already spoken with her DME. Since she has not met her deductible yet is the reason she has a monthly payment for the CPAP machine. She needed nothing from [place].

## 2015-03-30 LAB — LIPID PANEL
CHOL/HDL RATIO: 3.4 ratio (ref <=5.0)
Cholesterol: 169 mg/dL (ref 125–200)
HDL: 50 mg/dL (ref >=46)
LDL CALC: 102 mg/dL (ref <130)
Triglycerides: 86 mg/dL (ref <150)
VLDL: 17 mg/dL (ref <30)

## 2015-03-30 LAB — HEPATITIS C ANTIBODY: HCV Ab: REACTIVE — AB

## 2015-03-30 LAB — HEPATIC FUNCTION PANEL
ALK PHOS: 68 U/L (ref 33–130)
ALT: 14 U/L (ref 6–29)
AST: 17 U/L (ref 10–35)
Albumin: 3.9 g/dL (ref 3.6–5.1)
BILIRUBIN DIRECT: 0.1 mg/dL (ref <=0.2)
BILIRUBIN INDIRECT: 0.2 mg/dL (ref 0.2–1.2)
BILIRUBIN TOTAL: 0.3 mg/dL (ref 0.2–1.2)
Total Protein: 7 g/dL (ref 6.1–8.1)

## 2015-03-30 LAB — HEMOGLOBIN A1C
Hgb A1c MFr Bld: 6.3 % — ABNORMAL HIGH (ref <5.7)
Mean Plasma Glucose: 134 mg/dL — ABNORMAL HIGH (ref <117)

## 2015-04-02 LAB — HEPATITIS C RNA QUANTITATIVE: HCV QUANT: NOT DETECTED [IU]/mL (ref <15)

## 2015-04-25 ENCOUNTER — Telehealth: Payer: Self-pay | Admitting: Orthopedic Surgery

## 2015-04-25 ENCOUNTER — Emergency Department (HOSPITAL_COMMUNITY)
Admission: EM | Admit: 2015-04-25 | Discharge: 2015-04-25 | Disposition: A | Discharge status: Home / Self Care | Payer: BC Managed Care – PPO | Attending: Emergency Medicine | Admitting: Emergency Medicine

## 2015-04-25 ENCOUNTER — Emergency Department (HOSPITAL_COMMUNITY): Payer: BC Managed Care – PPO

## 2015-04-25 ENCOUNTER — Encounter (HOSPITAL_COMMUNITY): Payer: Self-pay | Admitting: Emergency Medicine

## 2015-04-25 DIAGNOSIS — Y939 Activity, unspecified: Secondary | ICD-10-CM | POA: Diagnosis not present

## 2015-04-25 DIAGNOSIS — Z87891 Personal history of nicotine dependence: Secondary | ICD-10-CM | POA: Insufficient documentation

## 2015-04-25 DIAGNOSIS — Y990 Civilian activity done for income or pay: Secondary | ICD-10-CM | POA: Insufficient documentation

## 2015-04-25 DIAGNOSIS — W19XXXA Unspecified fall, initial encounter: Secondary | ICD-10-CM

## 2015-04-25 DIAGNOSIS — E663 Overweight: Secondary | ICD-10-CM | POA: Diagnosis not present

## 2015-04-25 DIAGNOSIS — Y9269 Other specified industrial and construction area as the place of occurrence of the external cause: Secondary | ICD-10-CM | POA: Diagnosis not present

## 2015-04-25 DIAGNOSIS — S99911A Unspecified injury of right ankle, initial encounter: Secondary | ICD-10-CM | POA: Diagnosis present

## 2015-04-25 DIAGNOSIS — S92302A Fracture of unspecified metatarsal bone(s), left foot, initial encounter for closed fracture: Secondary | ICD-10-CM

## 2015-04-25 DIAGNOSIS — S92355A Nondisplaced fracture of fifth metatarsal bone, left foot, initial encounter for closed fracture: Secondary | ICD-10-CM | POA: Diagnosis not present

## 2015-04-25 DIAGNOSIS — S93401A Sprain of unspecified ligament of right ankle, initial encounter: Secondary | ICD-10-CM | POA: Diagnosis not present

## 2015-04-25 DIAGNOSIS — W0110XA Fall on same level from slipping, tripping and stumbling with subsequent striking against unspecified object, initial encounter: Secondary | ICD-10-CM | POA: Diagnosis not present

## 2015-04-25 MED ORDER — HYDROCODONE-ACETAMINOPHEN 5-325 MG PO TABS: 1.0000 | ORAL_TABLET | ORAL | Status: DC | PRN

## 2015-04-25 NOTE — Telephone Encounter (Signed)
Spoke with patient today -- she has called following [name] visit for problem of fractured ankle, which she said occurred at work in parking lot last night, 04/24/15.  Discussed and offered appointment, upon receiving workers [employer]; states she has been in touch with her employer, and that they would be setting up something but she was not sure yet whether it would be with our office.  States will call back if needs to have her employer call to schedule.

## 2015-04-25 NOTE — ED Notes (Signed)
Pt denies any pain to right or left tibular/fibula area.

## 2015-04-25 NOTE — Discharge Instructions (Signed)
Ankle Sprain °An ankle sprain is an injury to the strong, fibrous tissues (ligaments) that hold the bones of your ankle joint together.  °CAUSES °An ankle sprain is usually caused by a fall or by twisting your ankle. Ankle sprains most commonly occur when you step on the outer edge of your foot, and your ankle turns inward. People who participate in sports are more prone to these types of injuries.  °SYMPTOMS  °· Pain in your ankle. The pain may be present at rest or only when you are trying to stand or walk. °· Swelling. °· Bruising. Bruising may develop immediately or within 1 to 2 days after your injury. °· Difficulty standing or walking, particularly when turning corners or changing directions. °DIAGNOSIS  °Your caregiver will ask you details about your injury and perform a physical exam of your ankle to determine if you have an ankle sprain. During the physical exam, your caregiver will press on and apply pressure to specific areas of your foot and ankle. Your caregiver will try to move your ankle in certain ways. An X-ray exam may be done to be sure a bone was not broken or a ligament did not separate from one of the bones in your ankle (avulsion fracture).  °TREATMENT  °Certain types of braces can help stabilize your ankle. Your caregiver can make a recommendation for this. Your caregiver may recommend the use of medicine for pain. If your sprain is severe, your caregiver may refer you to a surgeon who helps to restore function to parts of your skeletal system (orthopedist) or a physical therapist. °HOME CARE INSTRUCTIONS  °· Apply ice to your injury for 1-2 days or as directed by your caregiver. Applying ice helps to reduce inflammation and pain. °· Put ice in a plastic bag. °· Place a towel between your skin and the bag. °· Leave the ice on for 15-20 minutes at a time, every 2 hours while you are awake. °· Only take over-the-counter or prescription medicines for pain, discomfort, or fever as directed by  your caregiver. °· Elevate your injured ankle above the level of your heart as much as possible for 2-3 days. °· If your caregiver recommends crutches, use them as instructed. Gradually put weight on the affected ankle. Continue to use crutches or a cane until you can walk without feeling pain in your ankle. °· If you have a plaster splint, wear the splint as directed by your caregiver. Do not rest it on anything harder than a pillow for the first 24 hours. Do not put weight on it. Do not get it wet. You may take it off to take a shower or bath. °· You may have been given an elastic bandage to wear around your ankle to provide support. If the elastic bandage is too tight (you have numbness or tingling in your foot or your foot becomes cold and blue), adjust the bandage to make it comfortable. °· If you have an air splint, you may blow more air into it or let air out to make it more comfortable. You may take your splint off at night and before taking a shower or bath. Wiggle your toes in the splint several times per day to decrease swelling. °SEEK MEDICAL CARE IF:  °· You have rapidly increasing bruising or swelling. °· Your toes feel extremely cold or you lose feeling in your foot. °· Your pain is not relieved with medicine. °SEEK IMMEDIATE MEDICAL CARE IF: °· Your toes are numb or blue. °·   You have severe pain that is increasing. MAKE SURE YOU:   Understand these instructions.  Will watch your condition.  Will get help right away if you are not doing well or get worse.   This information is not intended to replace advice given to you by your health care provider. Make sure you discuss any questions you have with your health care provider.   Document Released: 02/02/2005 Document Revised: 02/23/2014 Document Reviewed: 02/14/2011 Elsevier Interactive Patient Education 2016 [place] or Splint Care Casts and splints support injured limbs and keep bones from moving while they heal.  HOME  CARE  Keep the cast or splint uncovered during the drying period.  A plaster cast can take 24 to 48 hours to dry.  A fiberglass cast will dry in less than 1 hour.  Do not rest the cast on anything harder than a pillow for 24 hours.  Do not put weight on your injured limb. Do not put pressure on the cast. Wait for your doctor's approval.  Keep the cast or splint dry.  Cover the cast or splint with a plastic bag during baths or wet weather.  If you have a cast over your chest and belly (trunk), take sponge baths until the cast is taken off.  If your cast gets wet, dry it with a towel or blow dryer. Use the cool setting on the blow dryer.  Keep your cast or splint clean. Wash a dirty cast with a damp cloth.  Do not put any objects under your cast or splint.  Do not scratch the skin under the cast with an object. If itching is a problem, use a blow dryer on a cool setting over the itchy area.  Do not trim or cut your cast.  Do not take out the padding from inside your cast.  Exercise your joints near the cast as told by your doctor.  Raise (elevate) your injured limb on 1 or 2 pillows for the first 1 to 3 days. GET HELP IF:  Your cast or splint cracks.  Your cast or splint is too tight or too loose.  You itch badly under the cast.  Your cast gets wet or has a soft spot.  You have a bad smell coming from the cast.  You get an object stuck under the cast.  Your skin around the cast becomes red or sore.  You have new or more pain after the cast is put on. GET HELP RIGHT AWAY IF:  You have fluid leaking through the cast.  You cannot move your fingers or toes.  Your fingers or toes turn blue or white or are cool, painful, or puffy (swollen).  You have tingling or lose feeling (numbness) around the injured area.  You have bad pain or pressure under the cast.  You have trouble breathing or have shortness of breath.  You have chest pain.   This information is not  intended to replace advice given to you by your health care provider. Make sure you discuss any questions you have with your health care provider.   Document Released: 06/04/2010 Document Revised: 10/05/2012 Document Reviewed: 08/11/2012 Elsevier Interactive Patient Education 2016 [employer]  Metatarsal Fracture A metatarsal fracture is a break in a metatarsal bone. Metatarsal bones connect your toe bones to your ankle bones. CAUSES This type of fracture may be caused by:  A sudden twisting of your foot.  A fall onto your foot.  Overuse or repetitive exercise. RISK  FACTORS This condition is more likely to develop in people who:  Play contact sports.  Have a bone disease.  Have a low calcium level. SYMPTOMS Symptoms of this condition include:  Pain that is worse when walking or standing.  Pain when pressing on the foot or moving the toes.  Swelling.  Bruising on the top or bottom of the foot.  A foot that appears shorter than the other one. DIAGNOSIS This condition is diagnosed with a physical exam. You may also have imaging tests, such as:  X-rays.  A CT scan.  MRI. TREATMENT Treatment for this condition depends on its severity and whether a bone has moved out of place. Treatment may involve:  Rest.  Wearing foot support such as a cast, splint, or boot for several weeks.  Using crutches.  Surgery to move bones back into the right position. Surgery is usually needed if there are many pieces of broken bone or bones that are very out of place (displaced fracture).  Physical therapy. This may be needed to help you regain full movement and strength in your foot. You will need to return to your health care provider to have X-rays taken until your bones heal. Your health care provider will look at the X-rays to make sure that your foot is healing well. HOME CARE INSTRUCTIONS  If You Have a Cast:  Do not stick anything inside the cast to scratch your skin. Doing  that increases your risk of infection.  Check the skin around the cast every day. Report any concerns to your health care provider. You may put lotion on dry skin around the edges of the cast. Do not apply lotion to the skin underneath the cast.  Keep the cast clean and dry. If You Have a Splint or a Supportive Boot:  Wear it as directed by your health care provider. Remove it only as directed by your health care provider.  Loosen it if your toes become numb and tingle, or if they turn cold and blue.  Keep it clean and dry. Bathing  Do not take baths, swim, or use a hot tub until your health care provider approves. Ask your health care provider if you can take showers. You may only be allowed to take sponge baths for bathing.  If your health care provider approves bathing and showering, cover the cast or splint with a watertight plastic bag to protect it from water. Do not let the cast or splint get wet. Managing Pain, Stiffness, and Swelling  If directed, apply ice to the injured area (if you have a splint, not a cast).  Put ice in a plastic bag.  Place a towel between your skin and the bag.  Leave the ice on for 20 minutes, 2-3 times per day.  Move your toes often to avoid stiffness and to lessen swelling.  Raise (elevate) the injured area above the level of your heart while you are sitting or lying down. Driving  Do not drive or operate heavy machinery while taking pain medicine.  Do not drive while wearing foot support on a foot that you use for driving. Activity  Return to your normal activities as directed by your health care provider. Ask your health care provider what activities are safe for you.  Perform exercises as directed by your health care provider or physical therapist. Safety  Do not use the injured foot to support your body weight until your health care provider says that you can. Use crutches as  directed by your health care provider. General  Instructions  Do not put pressure on any part of the cast or splint until it is fully hardened. This may take several hours.  Do not use any tobacco products, including cigarettes, chewing tobacco, or e-cigarettes. Tobacco can delay bone healing. If you need help quitting, ask your health care provider.  Take medicines only as directed by your health care provider.  Keep all follow-up visits as directed by your health care provider. This is important. SEEK MEDICAL CARE IF:  You have a fever.  Your cast, splint, or boot is too loose or too tight.  Your cast, splint, or boot is damaged.  Your pain medicine is not helping.  You have pain, tingling, or numbness in your foot that is not going away. SEEK IMMEDIATE MEDICAL CARE IF:  You have severe pain.  You have tingling or numbness in your foot that is getting worse.  Your foot feels cold or becomes numb.  Your foot changes color.   This information is not intended to replace advice given to you by your health care provider. Make sure you discuss any questions you have with your health care provider.   Document Released: 10/25/2001 Document Revised: 06/19/2014 Document Reviewed: 11/29/2013 Elsevier Interactive Patient Education [employer].

## 2015-04-25 NOTE — ED Notes (Signed)
Fell yesterday at 1700 at work onto [employer].  Injury to left foot and right ankle.  Rates pain 8/10 and increases with walking.

## 2015-04-27 NOTE — ED Provider Notes (Signed)
CSN: [id number]     Arrival date & time 04/25/15  0945 History   First MD Initiated Contact with Patient 04/25/15 1052     Chief Complaint  Patient presents with  . Fall     (Consider location/radiation/quality/duration/timing/severity/associated sxs/prior Treatment) The history is provided by the patient.   Kristin Blackwell is a 65 y.o. female with no significant past medical history presenting with left lateral foot pain and swelling and right ankle pain after tripping on uneven pavement at work yesterday. She was ambulatory after the event, although with pain.  She denies head injury and denies radiation of pain, denies wounds and weakness or numbness in her extremities.  She has employed rest and ice packs with no significant improvement in pain.     Past Medical History  Diagnosis Date  . Nicotine addiction   . Overweight(278.02)    Past Surgical History  Procedure Laterality Date  . Partial hysterectomy  1987    had one ovary removed    Family History  Problem Relation Age of Onset  . Hypertension Mother   . Emphysema Father   . Hypertension Sister   . Diabetes Brother     oldest    Social History  Substance Use Topics  . Smoking status: Former Smoker -- 1.00 packs/day    Types: Cigarettes  . Smokeless tobacco: None     Comment: 1 week ago   . Alcohol Use: No   OB History    No data available     Review of Systems  Constitutional: Negative for fever.  Musculoskeletal: Positive for joint swelling and arthralgias. Negative for myalgias.  Neurological: Negative for weakness and numbness.      Allergies  Ibandronate sodium; Lipitor; and Pravastatin  Home Medications   Prior to Admission medications   Medication Sig Start Date End Date Taking? Authorizing Provider  HYDROcodone-acetaminophen (NORCO/VICODIN) 5-325 MG tablet Take 1 tablet by mouth every 4 (four) hours as needed. 04/25/15   Evalee Jefferson, PA-C  loratadine (CLARITIN) 10 MG tablet Take 10 mg by  mouth daily as needed for allergies.    Historical Provider, MD  pseudoephedrine-acetaminophen (TYLENOL SINUS) 30-500 MG TABS tablet Take 1 tablet by mouth every 4 (four) hours as needed.    Historical Provider, MD  rosuvastatin (CRESTOR) 10 MG tablet Take 1 tablet (10 mg total) by mouth daily. 07/10/14   Fayrene Helper, MD  sulfamethoxazole-trimethoprim (BACTRIM DS,SEPTRA DS) 800-160 MG tablet Take 1 tablet by mouth 2 (two) times daily. 01/29/15   Fayrene Helper, MD   BP 141/84 mmHg  Pulse 64  Temp(Src) 97.9 F (36.6 C) (Oral)  Resp 14  Ht 5\' 6"  (1.676 m)  Wt 80.74 kg  BMI 28.74 kg/m2  SpO2 99% Physical Exam  Constitutional: She appears well-developed and well-nourished.  HENT:  Head: Normocephalic.  Cardiovascular: Normal rate and intact distal pulses.  Exam reveals no decreased pulses.   Pulses:      Dorsalis pedis pulses are 2+ on the right side, and 2+ on the left side.  Less than 2 sec cap refill in toes bilaterally  Musculoskeletal: She exhibits tenderness. She exhibits no edema.       Right ankle: She exhibits swelling. She exhibits normal range of motion, no ecchymosis and normal pulse. Tenderness. Lateral malleolus tenderness found. No head of 5th metatarsal and no proximal fibula tenderness found. Achilles tendon normal.       Left foot: There is bony tenderness and swelling. There is normal  capillary refill, no crepitus, no deformity and no laceration.       Feet:  Neurological: She is alert. No sensory deficit.  Skin: Skin is warm, dry and intact.  Nursing note and vitals reviewed.   ED Course  Procedures (including critical care time) Labs Review Labs Reviewed - No data to display  Imaging Review Dg Ankle Complete Right  04/25/2015  CLINICAL DATA:  Pain and swelling along the lateral aspect of the right ankle. EXAM: RIGHT ANKLE - COMPLETE 3+ VIEW COMPARISON:  None. FINDINGS: There is no evidence of fracture, dislocation, or joint effusion. There is relative  pes planus. There is mild soft tissue swelling over the lateral malleolus. IMPRESSION: No acute osseous injury of the right ankle. Electronically Signed   By: Kathreen Devoid   On: 04/25/2015 10:38   Dg Foot Complete Left  04/25/2015  CLINICAL DATA:  Stepped on un even pavement with lateral foot pain, initial encounter EXAM: LEFT FOOT - COMPLETE 3+ VIEW COMPARISON:  None. FINDINGS: There is an undisplaced transverse fracture through the base of the fifth metatarsal. No other fractures are seen. No gross soft tissue abnormality is noted. IMPRESSION: Proximal fifth metatarsal fracture Electronically Signed   By: Inez Catalina M.D.   On: 04/25/2015 10:37   I have personally reviewed and evaluated these images and lab results as part of my medical decision-making.   EKG Interpretation None      MDM   Final diagnoses:  Ankle sprain, right, initial encounter  Fracture of metatarsal bone of left foot, closed, initial encounter    aso on right ankle, posterior splint left foot. Crutches provided, advised no weight bearing left.  RICE, hydrocodone. Referral to ortho for f/u care. Pt aware need to call for appt.  Splint was examined post application, pain improved,  Patient can wiggle digits, less than 3 sec cap refill.      Evalee Jefferson, PA-C 04/27/15 [place], DO 04/29/15 [phone]

## 2015-05-09 ENCOUNTER — Ambulatory Visit: Payer: BC Managed Care – PPO | Admitting: Internal Medicine

## 2015-05-22 ENCOUNTER — Ambulatory Visit: Payer: BC Managed Care – PPO | Admitting: Family Medicine

## 2015-05-27 ENCOUNTER — Other Ambulatory Visit: Payer: Self-pay | Admitting: Family Medicine

## 2015-06-24 ENCOUNTER — Encounter: Payer: Self-pay | Admitting: Family Medicine

## 2015-06-24 ENCOUNTER — Ambulatory Visit (INDEPENDENT_AMBULATORY_CARE_PROVIDER_SITE_OTHER): Payer: BC Managed Care – PPO | Admitting: Family Medicine

## 2015-06-24 VITALS — BP 130/76 | HR 60 | Resp 18 | Ht 66.0 in | Wt 176.0 lb

## 2015-06-24 DIAGNOSIS — J3089 Other allergic rhinitis: Secondary | ICD-10-CM

## 2015-06-24 DIAGNOSIS — E559 Vitamin D deficiency, unspecified: Secondary | ICD-10-CM | POA: Diagnosis not present

## 2015-06-24 DIAGNOSIS — Z1159 Encounter for screening for other viral diseases: Secondary | ICD-10-CM

## 2015-06-24 DIAGNOSIS — R7303 Prediabetes: Secondary | ICD-10-CM

## 2015-06-24 DIAGNOSIS — E785 Hyperlipidemia, unspecified: Secondary | ICD-10-CM

## 2015-06-24 DIAGNOSIS — E663 Overweight: Secondary | ICD-10-CM

## 2015-06-24 DIAGNOSIS — R768 Other specified abnormal immunological findings in serum: Secondary | ICD-10-CM

## 2015-06-24 DIAGNOSIS — R894 Abnormal immunological findings in specimens from other organs, systems and tissues: Secondary | ICD-10-CM

## 2015-06-24 DIAGNOSIS — R7689 Other specified abnormal immunological findings in serum: Secondary | ICD-10-CM

## 2015-06-24 DIAGNOSIS — G4733 Obstructive sleep apnea (adult) (pediatric): Secondary | ICD-10-CM

## 2015-06-24 DIAGNOSIS — E8881 Metabolic syndrome: Secondary | ICD-10-CM | POA: Diagnosis not present

## 2015-06-24 NOTE — Progress Notes (Signed)
Subjective:    Patient ID: Kristin Blackwell, female    DOB: January 18, 1951, 65 y.o.   MRN: [id number]  HPI   Kristin Blackwell     MRN: [id number]      DOB: 03-Dec-1950   HPI Kristin Blackwell is here for follow up and re-evaluation of chronic medical conditions, medication management and review of any available recent lab and radiology data.  Preventive health is updated, specifically  Cancer screening and Immunization.   Questions or concerns regarding consultations or procedures which the PT has had in the interim are  Addressed.Heal;ing from fracture, still using crutch The PT denies any adverse reactions to current medications since the last visit.    ROS Denies recent fever or chills. Denies sinus pressure, nasal congestion, ear pain or sore throat. Denies chest congestion, productive cough or wheezing. Denies chest pains, palpitations and leg swelling Denies abdominal pain, nausea, vomiting,diarrhea or constipation.   Denies dysuria, frequency, hesitancy or incontinence. . Denies headaches, seizures, numbness, or tingling. Denies depression, anxiety or insomnia. Denies skin break down or rash.   PE  BP 130/76 mmHg  Pulse 60  Resp 18  Ht 5\' 6"  (1.676 m)  Wt 176 lb (79.833 kg)  BMI 28.42 kg/m2  SpO2 97%  Patient alert and oriented and in no cardiopulmonary distress.  HEENT: No facial asymmetry, EOMI,   oropharynx pink and moist.  Neck supple no JVD, no mass.  Chest: Clear to auscultation bilaterally.  CVS: S1, S2 no murmurs, no S3.Regular rate.  ABD: Soft non tender.   Ext: No edema  MS: Adequate ROM spine, shoulders,reduced in left lower ext  Skin: Intact, no ulcerations or rash noted.  Psych: Good eye contact, normal affect. Memory intact not anxious or depressed appearing.  CNS: CN 2-12 intact, power,  normal throughout.no focal deficits noted.   Assessment & Plan   Obstructive sleep apnea Still has not invested in needed CPAP, encouraged her to do so  for her cardio pulmonary health, states financial reasons are a barrier  Allergic rhinitis More symptomatic with season change, but controlled on medication  Hyperlipidemia LDL goal <100 Hyperlipidemia:Low fat diet discussed and encouraged.   Lipid Panel  Lab Results  Component Value Date   CHOL 169 03/30/2015   HDL 50 03/30/2015   LDLCALC 102 03/30/2015   TRIG 86 03/30/2015   CHOLHDL 3.4 03/30/2015   Controlled, no change in medication LDL slightly above goal     Overweight (BMI 25.0-29.9) Improved. Pt applauded on succesful weight loss through lifestyle change, and encouraged to continue same. Weight loss goal set for the next several months.   Prediabetes Patient educated about the importance of limiting  Carbohydrate intake , the need to commit to daily physical activity for a minimum of 30 minutes , and to commit weight loss. The fact that changes in all these areas will reduce or eliminate all together the development of diabetes is stressed.  Unchnaged  Diabetic Labs Latest Ref Rng 03/30/2015 08/24/2014 04/23/2014 11/22/2013 07/07/2013  HbA1c <5.7 % 6.3(H) 6.3(H) 6.4(H) 6.2(H) 6.2(H)  Chol 125 - 200 mg/dL 169 - 140 247(H) 194  HDL >=46 mg/dL 50 - 48 47 51  Calc LDL <130 mg/dL 102 - 69 173(H) 128(H)  Triglycerides <150 mg/dL 86 - 116 134 77  Creatinine 0.50 - 1.10 mg/dL - 0.71 0.87 0.88 0.84   BP/Weight 06/24/2015 04/25/2015 01/29/2015 01/23/2015 12/18/2014 123456 Q000111Q  Systolic BP [id number] [id number] 123456 [id number]  Diastolic BP 76  84 74 68 68 82 72  Wt. (Lbs) 176 178 180 181.4 182 181 179.12  BMI 28.42 28.74 29.07 29.29 29.39 30.12 29.81   No flowsheet data found.     Metabolic syndrome X The increased risk of cardiovascular disease associated with this diagnosis, and the need to consistently work on lifestyle to change this is discussed. Following  a  heart healthy diet ,commitment to 30 minutes of exercise at least 5 days per week, as well as control of blood sugar  and cholesterol , and achieving a healthy weight are all the areas to be addressed .   Hepatitis C antibody test positive Negative viral load, will rept in next 4 months Normal liver function, no stigmata of liver disease       Review of Systems     Objective:   Physical Exam        Assessment & Plan:

## 2015-06-24 NOTE — Patient Instructions (Addendum)
F/u in 6 month, call if you need me before  Please increase plaNT INTAKE , SO THAT CHOLESTEROL AND BLOOD SUGAR IMPROVE, BLOOD SUGAR STILL TOO HIGH  Need cpap MACHNE SO THAT YOU PROTECT YOUR HEART AND LUNGS, NEED THIS!!!  fASTING LIPID, CMP AND egfr , HbA1c, tsh, VIT d,hEP c QUANTITATIVE AND cbC IN 5 MONTH AND 3 WEEKS  START ASPIRIN 81 MG ONE DAILY TO REDUCE STROKE RISK   DISCUSS FACT THAT YOU HAVE HAD A FRACTURE WITH YOUR GYNE TO SEE IF SHE/ HE DECIDES THAT YOU ALSO NEED A BONE BUILDER, LIKE FOSAMAX  Thanks for choosing [facility], we consider it a privelige to serve you.

## 2015-06-25 DIAGNOSIS — R768 Other specified abnormal immunological findings in serum: Secondary | ICD-10-CM | POA: Insufficient documentation

## 2015-06-25 NOTE — Assessment & Plan Note (Signed)
Hyperlipidemia:Low fat diet discussed and encouraged.   Lipid Panel  Lab Results  Component Value Date   CHOL 169 03/30/2015   HDL 50 03/30/2015   LDLCALC 102 03/30/2015   TRIG 86 03/30/2015   CHOLHDL 3.4 03/30/2015   Controlled, no change in medication LDL slightly above goal

## 2015-06-25 NOTE — Assessment & Plan Note (Signed)
The increased risk of cardiovascular disease associated with this diagnosis, and the need to consistently work on lifestyle to change this is discussed. Following  a  heart healthy diet ,commitment to 30 minutes of exercise at least 5 days per week, as well as control of blood sugar and cholesterol , and achieving a healthy weight are all the areas to be addressed .  

## 2015-06-25 NOTE — Assessment & Plan Note (Signed)
More symptomatic with season change, but controlled on medication

## 2015-06-25 NOTE — Assessment & Plan Note (Signed)
Still has not invested in needed CPAP, encouraged her to do so for her cardio pulmonary health, states financial reasons are a barrier

## 2015-06-25 NOTE — Assessment & Plan Note (Signed)
Improved. Pt applauded on succesful weight loss through lifestyle change, and encouraged to continue same. Weight loss goal set for the next several months.  

## 2015-06-25 NOTE — Assessment & Plan Note (Signed)
Negative viral load, will rept in next 4 months Normal liver function, no stigmata of liver disease

## 2015-06-25 NOTE — Assessment & Plan Note (Signed)
Patient educated about the importance of limiting  Carbohydrate intake , the need to commit to daily physical activity for a minimum of 30 minutes , and to commit weight loss. The fact that changes in all these areas will reduce or eliminate all together the development of diabetes is stressed.  Unchnaged  Diabetic Labs Latest Ref Rng 03/30/2015 08/24/2014 04/23/2014 11/22/2013 07/07/2013  HbA1c <5.7 % 6.3(H) 6.3(H) 6.4(H) 6.2(H) 6.2(H)  Chol 125 - 200 mg/dL 169 - 140 247(H) 194  HDL >=46 mg/dL 50 - 48 47 51  Calc LDL <130 mg/dL 102 - 69 173(H) 128(H)  Triglycerides <150 mg/dL 86 - 116 134 77  Creatinine 0.50 - 1.10 mg/dL - 0.71 0.87 0.88 0.84   BP/Weight 06/24/2015 04/25/2015 01/29/2015 01/23/2015 12/18/2014 123456 Q000111Q  Systolic BP [id number] Q000111Q 123456 [id number]  Diastolic BP 76 84 74 68 68 82 72  Wt. (Lbs) 176 178 180 181.4 182 181 179.12  BMI 28.42 28.74 29.07 29.29 29.39 30.12 29.81   No flowsheet data found.

## 2015-07-29 ENCOUNTER — Ambulatory Visit: Payer: BC Managed Care – PPO | Admitting: Internal Medicine

## 2016-01-24 ENCOUNTER — Telehealth: Payer: Self-pay

## 2016-01-24 DIAGNOSIS — R768 Other specified abnormal immunological findings in serum: Secondary | ICD-10-CM

## 2016-01-24 DIAGNOSIS — E559 Vitamin D deficiency, unspecified: Secondary | ICD-10-CM

## 2016-01-24 DIAGNOSIS — E785 Hyperlipidemia, unspecified: Secondary | ICD-10-CM

## 2016-01-24 DIAGNOSIS — R7303 Prediabetes: Secondary | ICD-10-CM

## 2016-01-24 NOTE — Telephone Encounter (Signed)
Expired labs reordered  

## 2016-01-27 ENCOUNTER — Encounter: Payer: Self-pay | Admitting: Family Medicine

## 2016-01-27 ENCOUNTER — Ambulatory Visit (INDEPENDENT_AMBULATORY_CARE_PROVIDER_SITE_OTHER): Payer: BC Managed Care – PPO | Admitting: Family Medicine

## 2016-01-27 VITALS — BP 122/72 | HR 60 | Resp 16 | Ht 66.0 in | Wt 181.0 lb

## 2016-01-27 DIAGNOSIS — G4733 Obstructive sleep apnea (adult) (pediatric): Secondary | ICD-10-CM

## 2016-01-27 DIAGNOSIS — E663 Overweight: Secondary | ICD-10-CM

## 2016-01-27 DIAGNOSIS — R7303 Prediabetes: Secondary | ICD-10-CM

## 2016-01-27 DIAGNOSIS — E8881 Metabolic syndrome: Secondary | ICD-10-CM

## 2016-01-27 DIAGNOSIS — E785 Hyperlipidemia, unspecified: Secondary | ICD-10-CM | POA: Diagnosis not present

## 2016-01-27 DIAGNOSIS — E559 Vitamin D deficiency, unspecified: Secondary | ICD-10-CM | POA: Diagnosis not present

## 2016-01-27 DIAGNOSIS — J302 Other seasonal allergic rhinitis: Secondary | ICD-10-CM

## 2016-01-27 NOTE — Patient Instructions (Signed)
F/u in 5 month, call if you need me sooner  Fasting labs this week  I recommend screening chest scan due to long smoking history, check your insurance for coverage please  Please work on good  health habits so that your health will improve. 1. Commitment to daily physical activity for 30 to 60  minutes, if you are able to do this.  2. Commitment to wise food choices. Aim for half of your  food intake to be vegetable and fruit, one quarter starchy foods, and one quarter protein. Try to eat on a regular schedule  3 meals per day, snacking between meals should be limited to vegetables or fruits or small portions of nuts. 64 ounces of water per day is generally recommended, unless you have specific health conditions, like heart failure or kidney failure where you will need to limit fluid intake.  3. Commitment to sufficient and a  good quality of physical and mental rest daily, generally between 6 to 8 hours per day.  WITH PERSISTANCE AND PERSEVERANCE, THE IMPOSSIBLE , BECOMES THE NORM! Thank you  for choosing [facility]. We consider it a privelige to serve you.  Delivering excellent health care in a caring and  compassionate way is our goal.  Partnering with you,  so that together we can achieve this goal is our strategy.

## 2016-02-01 NOTE — Progress Notes (Signed)
Kristin Blackwell     MRN: [id number]      DOB: February 03, 1951   HPI Kristin Blackwell is here for follow up and re-evaluation of chronic medical conditions, medication management and review of any available recent lab and radiology data.  Preventive health is updated, specifically  Cancer screening and Immunization.   Questions or concerns regarding consultations or procedures which the PT has had in the interim are  addressed. The PT denies any adverse reactions to current medications since the last visit.  C/o increased nasal congestion and clear drainage with sneezing in pastrt 3 weeks, no fever or chills, cough or sore throat  ROS Denies recent fever or chills. Denies chest pains, palpitations and leg swelling Denies abdominal pain, nausea, vomiting,diarrhea or constipation.   Denies dysuria, frequency, hesitancy or incontinence. Denies joint pain, swelling and limitation in mobility. Denies headaches, seizures, numbness, or tingling. Denies depression, anxiety or insomnia. Denies skin break down or rash.   PE  BP 122/72   Pulse 60   Resp 16   Ht 5\' 6"  (1.676 m)   Wt 181 lb (82.1 kg)   SpO2 96%   BMI 29.21 kg/m   Patient alert and oriented and in no cardiopulmonary distress.  HEENT: No facial asymmetry, EOMI,   oropharynx pink and moist.  Neck supple no JVD, no mass.Nasal mucosa   Chest: Clear to auscultation bilaterally.  CVS: S1, S2 no murmurs, no S3.Regular rate.  ABD: Soft non tender.   Ext: No edema  MS: Adequate ROM spine, shoulders, hips and knees.  Skin: Intact, no ulcerations or rash noted.  Psych: Good eye contact, normal affect. Memory intact not anxious or depressed appearing.  CNS: CN 2-12 intact, power,  normal throughout.no focal deficits noted.   Assessment & Plan  Hyperlipidemia LDL goal <100 Hyperlipidemia:Low fat diet discussed and encouraged.   Lipid Panel  Lab Results  Component Value Date   CHOL 169 03/30/2015   HDL 50 03/30/2015   LDLCALC 102 03/30/2015   TRIG 86 03/30/2015   CHOLHDL 3.4 03/30/2015   Updated lab needed .     Overweight (BMI 25.0-29.9) Deteriorated. Patient re-educated about  the importance of commitment to a  minimum of 150 minutes of exercise per week.  The importance of healthy food choices with portion control discussed. Encouraged to start a food diary, count calories and to consider  joining a support group. Sample diet sheets offered. Goals set by the patient for the next several months.   Weight /BMI 01/27/2016 06/24/2015 04/25/2015  WEIGHT 181 lb 176 lb 178 lb  HEIGHT 5\' 6"  5\' 6"  5\' 6"   BMI 29.21 kg/m2 28.42 kg/m2 28.74 kg/m2      Prediabetes Patient educated about the importance of limiting  Carbohydrate intake , the need to commit to daily physical activity for a minimum of 30 minutes , and to commit weight loss. The fact that changes in all these areas will reduce or eliminate all together the development of diabetes is stressed.  Updated lab needed at/ before next visit.   Diabetic Labs Latest Ref Rng & Units 03/30/2015 08/24/2014 04/23/2014 11/22/2013 07/07/2013  HbA1c <5.7 % 6.3(H) 6.3(H) 6.4(H) 6.2(H) 6.2(H)  Chol 125 - 200 mg/dL 169 - 140 247(H) 194  HDL >=46 mg/dL 50 - 48 47 51  Calc LDL <130 mg/dL 102 - 69 173(H) 128(H)  Triglycerides <150 mg/dL 86 - 116 134 77  Creatinine 0.50 - 1.10 mg/dL - 0.71 0.87 0.88 0.84   BP/Weight  01/27/2016 06/24/2015 04/25/2015 01/29/2015 01/23/2015 12/18/2014 123456  Systolic BP 123XX123 [id number] [id number]  Diastolic BP 72 76 84 74 68 68 82  Wt. (Lbs) 181 176 178 180 181.4 182 181  BMI 29.21 28.42 28.74 29.07 29.29 29.39 30.12   No flowsheet data found.    Allergic rhinitis Uncontrolled Start daily medication  Obstructive sleep apnea Daily use of CPAP machine still not a reality, needs to address this  Metabolic syndrome X The increased risk of cardiovascular disease associated with this diagnosis, and the need to consistently work on  lifestyle to change this is discussed. Following  a  heart healthy diet ,commitment to 30 minutes of exercise at least 5 days per week, as well as control of blood sugar and cholesterol , and achieving a healthy weight are all the areas to be addressed .

## 2016-02-01 NOTE — Assessment & Plan Note (Signed)
Daily use of CPAP machine still not a reality, needs to address this

## 2016-02-01 NOTE — Assessment & Plan Note (Signed)
Hyperlipidemia:Low fat diet discussed and encouraged.   Lipid Panel  Lab Results  Component Value Date   CHOL 169 03/30/2015   HDL 50 03/30/2015   LDLCALC 102 03/30/2015   TRIG 86 03/30/2015   CHOLHDL 3.4 03/30/2015   Updated lab needed .

## 2016-02-01 NOTE — Assessment & Plan Note (Signed)
Uncontrolled Start daily medication

## 2016-02-01 NOTE — Assessment & Plan Note (Signed)
Deteriorated. Patient re-educated about  the importance of commitment to a  minimum of 150 minutes of exercise per week.  The importance of healthy food choices with portion control discussed. Encouraged to start a food diary, count calories and to consider  joining a support group. Sample diet sheets offered. Goals set by the patient for the next several months.   Weight /BMI 01/27/2016 06/24/2015 04/25/2015  WEIGHT 181 lb 176 lb 178 lb  HEIGHT 5\' 6"  5\' 6"  5\' 6"   BMI 29.21 kg/m2 28.42 kg/m2 28.74 kg/m2

## 2016-02-01 NOTE — Assessment & Plan Note (Signed)
The increased risk of cardiovascular disease associated with this diagnosis, and the need to consistently work on lifestyle to change this is discussed. Following  a  heart healthy diet ,commitment to 30 minutes of exercise at least 5 days per week, as well as control of blood sugar and cholesterol , and achieving a healthy weight are all the areas to be addressed .  

## 2016-02-01 NOTE — Assessment & Plan Note (Signed)
Patient educated about the importance of limiting  Carbohydrate intake , the need to commit to daily physical activity for a minimum of 30 minutes , and to commit weight loss. The fact that changes in all these areas will reduce or eliminate all together the development of diabetes is stressed.  Updated lab needed at/ before next visit.   Diabetic Labs Latest Ref Rng & Units 03/30/2015 08/24/2014 04/23/2014 11/22/2013 07/07/2013  HbA1c <5.7 % 6.3(H) 6.3(H) 6.4(H) 6.2(H) 6.2(H)  Chol 125 - 200 mg/dL 169 - 140 247(H) 194  HDL >=46 mg/dL 50 - 48 47 51  Calc LDL <130 mg/dL 102 - 69 173(H) 128(H)  Triglycerides <150 mg/dL 86 - 116 134 77  Creatinine 0.50 - 1.10 mg/dL - 0.71 0.87 0.88 0.84   BP/Weight 01/27/2016 06/24/2015 04/25/2015 01/29/2015 01/23/2015 12/18/2014 123456  Systolic BP 123XX123 [id number] 123456 [id number]  Diastolic BP 72 76 84 74 68 68 82  Wt. (Lbs) 181 176 178 180 181.4 182 181  BMI 29.21 28.42 28.74 29.07 29.29 29.39 30.12   No flowsheet data found.

## 2016-02-06 LAB — LIPID PANEL
Cholesterol: 120 mg/dL (ref <200)
HDL: 42 mg/dL — ABNORMAL LOW (ref >50)
LDL CALC: 62 mg/dL (ref <100)
Total CHOL/HDL Ratio: 2.9 Ratio (ref <5.0)
Triglycerides: 81 mg/dL (ref <150)
VLDL: 16 mg/dL (ref <30)

## 2016-02-06 LAB — CBC
HCT: 35.2 % (ref 35.0–45.0)
Hemoglobin: 11.5 g/dL — ABNORMAL LOW (ref 11.7–15.5)
MCH: 27.1 pg (ref 27.0–33.0)
MCHC: 32.7 g/dL (ref 32.0–36.0)
MCV: 82.8 fL (ref 80.0–100.0)
MPV: 9.7 fL (ref 7.5–12.5)
PLATELETS: 314 10*3/uL (ref 140–400)
RBC: 4.25 MIL/uL (ref 3.80–5.10)
RDW: 15.7 % — AB (ref 11.0–15.0)
WBC: 6 10*3/uL (ref 3.8–10.8)

## 2016-02-06 LAB — COMPREHENSIVE METABOLIC PANEL
ALBUMIN: 3.7 g/dL (ref 3.6–5.1)
ALT: 11 U/L (ref 6–29)
AST: 17 U/L (ref 10–35)
Alkaline Phosphatase: 69 U/L (ref 33–130)
BILIRUBIN TOTAL: 0.4 mg/dL (ref 0.2–1.2)
BUN: 13 mg/dL (ref 7–25)
CO2: 23 mmol/L (ref 20–31)
CREATININE: 0.92 mg/dL (ref 0.50–0.99)
Calcium: 8.9 mg/dL (ref 8.6–10.4)
Chloride: 110 mmol/L (ref 98–110)
Glucose, Bld: 102 mg/dL — ABNORMAL HIGH (ref 65–99)
Potassium: 3.9 mmol/L (ref 3.5–5.3)
SODIUM: 140 mmol/L (ref 135–146)
TOTAL PROTEIN: 6.2 g/dL (ref 6.1–8.1)

## 2016-02-06 LAB — HEMOGLOBIN A1C
Hgb A1c MFr Bld: 6.1 % — ABNORMAL HIGH (ref <5.7)
Mean Plasma Glucose: 128 mg/dL

## 2016-02-06 LAB — TSH: TSH: 1.98 m[IU]/L

## 2016-02-07 LAB — VITAMIN D 25 HYDROXY (VIT D DEFICIENCY, FRACTURES): VIT D 25 HYDROXY: 39 ng/mL (ref 30–100)

## 2016-04-15 ENCOUNTER — Other Ambulatory Visit: Payer: Self-pay | Admitting: Family Medicine

## 2016-05-12 ENCOUNTER — Encounter: Payer: Self-pay | Admitting: Family Medicine

## 2016-05-12 ENCOUNTER — Ambulatory Visit (INDEPENDENT_AMBULATORY_CARE_PROVIDER_SITE_OTHER): Payer: BC Managed Care – PPO | Admitting: Family Medicine

## 2016-05-12 VITALS — BP 118/74 | HR 96 | Temp 99.3°F | Resp 15 | Ht 66.0 in | Wt 177.0 lb

## 2016-05-12 DIAGNOSIS — E663 Overweight: Secondary | ICD-10-CM

## 2016-05-12 DIAGNOSIS — E785 Hyperlipidemia, unspecified: Secondary | ICD-10-CM | POA: Diagnosis not present

## 2016-05-12 DIAGNOSIS — J302 Other seasonal allergic rhinitis: Secondary | ICD-10-CM | POA: Diagnosis not present

## 2016-05-12 DIAGNOSIS — H6501 Acute serous otitis media, right ear: Secondary | ICD-10-CM

## 2016-05-12 DIAGNOSIS — H6691 Otitis media, unspecified, right ear: Secondary | ICD-10-CM | POA: Insufficient documentation

## 2016-05-12 MED ORDER — PREDNISONE 5 MG PO TABS: 5.0000 mg | ORAL_TABLET | Freq: Two times a day (BID) | ORAL | 0 refills | Status: AC

## 2016-05-12 MED ORDER — LORATADINE 10 MG PO TABS: 10.0000 mg | ORAL_TABLET | Freq: Every day | ORAL | 11 refills | Status: DC

## 2016-05-12 MED ORDER — BENZONATATE 100 MG PO CAPS: 100.0000 mg | ORAL_CAPSULE | Freq: Two times a day (BID) | ORAL | 0 refills | Status: DC | PRN

## 2016-05-12 MED ORDER — AZITHROMYCIN 250 MG PO TABS: ORAL_TABLET | ORAL | 0 refills | Status: DC

## 2016-05-12 NOTE — Assessment & Plan Note (Signed)
Uncontrolled, needs to commit to daily claritin, and nasal flushes as needed. Short course of prednisone and  tessalon perles prescribed. Work excuse to return in 2 days

## 2016-05-12 NOTE — Assessment & Plan Note (Signed)
z pack prescribed 

## 2016-05-12 NOTE — Progress Notes (Signed)
   Kristin Blackwell     MRN: [id number]      DOB: 1950-09-27   HPI Kristin Blackwell is here  1 week h/o worsening head and chest congestion, not associated with fever and chills  Nasal drainage is excessive, clear and watery. C/o right  ear pressure, and right neck and throat pain, denies hearing loss and sore throat.Started yesterday Increasing fatigue , poor appetitie and sleep disturbed by cough. No improvement with OTC medication. Has not been able to work this week because of her symptoms and states that this pat Saturday she had chills  ROS  Denies chest pains, palpitations and leg swelling Denies abdominal pain, nausea, vomiting,diarrhea or constipation.   Denies dysuria, frequency, hesitancy or incontinence. Denies joint pain, swelling and limitation in mobility. Denies headaches, seizures, numbness, or tingling. Denies depression, anxiety or insomnia. Denies skin break down or rash.   PE  BP 118/74   Pulse 96   Temp 99.3 F (37.4 C)   Resp 15   Ht 5\' 6"  (1.676 m)   Wt 177 lb (80.3 kg)   SpO2 95%   BMI 28.57 kg/m   Patient alert and oriented and in no cardiopulmonary distress.  HEENT: No facial asymmetry, EOMI,   oropharynx pink and moist.  Neck supple no JVD, no mass.Nasal mucosa erythematous and edematous, excess watery nasal drainage, Right TM erythematous, no cervical adenopathy Chest: Clear to auscultation bilaterally.  CVS: S1, S2 no murmurs, no S3.Regular rate.  ABD: Soft non tender.   Ext: No edema  MS: Adequate ROM spine, shoulders, hips and knees.  Skin: Intact, no ulcerations or rash noted.  Psych: Good eye contact, normal affect. Memory intact not anxious or depressed appearing.  CNS: CN 2-12 intact, power,  normal throughout.no focal deficits noted.   Assessment & Plan  Allergic rhinitis Uncontrolled, needs to commit to daily claritin, and nasal flushes as needed. Short course of prednisone and  tessalon perles prescribed. Work excuse to  return in 2 days  Right otitis media z pack prescribed  Overweight (BMI 25.0-29.9) Improved, pt applauded on this Patient re-educated about  the importance of commitment to a  minimum of 150 minutes of exercise per week.  The importance of healthy food choices with portion control discussed. Encouraged to start a food diary, count calories and to consider  joining a support group. Sample diet sheets offered. Goals set by the patient for the next several months.   Weight /BMI 05/12/2016 01/27/2016 06/24/2015  WEIGHT 177 lb 181 lb 176 lb  HEIGHT 5\' 6"  5\' 6"  5\' 6"   BMI 28.57 kg/m2 29.21 kg/m2 28.42 kg/m2      Hyperlipidemia LDL goal <100 Hyperlipidemia:Low fat diet discussed and encouraged.   Lipid Panel  Lab Results  Component Value Date   CHOL 120 02/06/2016   HDL 42 (L) 02/06/2016   LDLCALC 62 02/06/2016   TRIG 81 02/06/2016   CHOLHDL 2.9 02/06/2016   Needs to increase exercise commitment

## 2016-05-12 NOTE — Patient Instructions (Signed)
f/u in May as before, call if you ned me sooner  You are treated for uncontrolled allergy symptoms, prednisone for 5 days, and please commit to daily loratadine and saline nose flushes once to twice per day, as needed    Z pack prescribed for left ear infection  Decongestant capsule prescribed    Work excuse for yesterday return on thursday 05/14/2016   Blood pressure is great

## 2016-05-12 NOTE — Assessment & Plan Note (Signed)
Improved, pt applauded on this Patient re-educated about  the importance of commitment to a  minimum of 150 minutes of exercise per week.  The importance of healthy food choices with portion control discussed. Encouraged to start a food diary, count calories and to consider  joining a support group. Sample diet sheets offered. Goals set by the patient for the next several months.   Weight /BMI 05/12/2016 01/27/2016 06/24/2015  WEIGHT 177 lb 181 lb 176 lb  HEIGHT 5\' 6"  5\' 6"  5\' 6"   BMI 28.57 kg/m2 29.21 kg/m2 28.42 kg/m2

## 2016-05-12 NOTE — Assessment & Plan Note (Signed)
Hyperlipidemia:Low fat diet discussed and encouraged.   Lipid Panel  Lab Results  Component Value Date   CHOL 120 02/06/2016   HDL 42 (L) 02/06/2016   LDLCALC 62 02/06/2016   TRIG 81 02/06/2016   CHOLHDL 2.9 02/06/2016   Needs to increase exercise commitment

## 2016-05-26 ENCOUNTER — Telehealth: Payer: Self-pay | Admitting: Family Medicine

## 2016-05-26 NOTE — Telephone Encounter (Signed)
Patient calling request a Rx that will help with the sinus infection.  She states it has gotten worse and she's tried everything OTC but continues to cough and has had nose bleeds this morning. Please advise.  She states she does not want to come in again until her visit in May.

## 2016-05-27 ENCOUNTER — Other Ambulatory Visit: Payer: Self-pay | Admitting: Family Medicine

## 2016-05-27 MED ORDER — PENICILLIN V POTASSIUM 500 MG PO TABS: 500.0000 mg | ORAL_TABLET | Freq: Three times a day (TID) | ORAL | 0 refills | Status: DC

## 2016-05-27 NOTE — Telephone Encounter (Signed)
Pt aware that penicillin sent to her pharmacy, 10 day course

## 2016-05-28 ENCOUNTER — Emergency Department (HOSPITAL_COMMUNITY): Payer: BC Managed Care – PPO

## 2016-05-28 ENCOUNTER — Emergency Department (HOSPITAL_COMMUNITY)
Admission: EM | Admit: 2016-05-28 | Discharge: 2016-05-28 | Disposition: A | Discharge status: Home / Self Care | Payer: BC Managed Care – PPO | Attending: Emergency Medicine | Admitting: Emergency Medicine

## 2016-05-28 ENCOUNTER — Encounter (HOSPITAL_COMMUNITY): Payer: Self-pay

## 2016-05-28 DIAGNOSIS — Z87891 Personal history of nicotine dependence: Secondary | ICD-10-CM | POA: Insufficient documentation

## 2016-05-28 DIAGNOSIS — R05 Cough: Secondary | ICD-10-CM | POA: Diagnosis present

## 2016-05-28 DIAGNOSIS — J181 Lobar pneumonia, unspecified organism: Secondary | ICD-10-CM | POA: Diagnosis not present

## 2016-05-28 DIAGNOSIS — J189 Pneumonia, unspecified organism: Secondary | ICD-10-CM

## 2016-05-28 HISTORY — DX: Hyperlipidemia, unspecified: E78.5

## 2016-05-28 MED ORDER — HYDROCOD POLST-CPM POLST ER 10-8 MG/5ML PO SUER: 5.0000 mL | Freq: Two times a day (BID) | ORAL | 0 refills | Status: DC | PRN

## 2016-05-28 MED ORDER — LEVOFLOXACIN 750 MG PO TABS: 750.0000 mg | ORAL_TABLET | Freq: Every day | ORAL | 0 refills | Status: DC

## 2016-05-28 MED ORDER — ALBUTEROL SULFATE HFA 108 (90 BASE) MCG/ACT IN AERS
2.0000 | INHALATION_SPRAY | RESPIRATORY_TRACT | Status: DC | PRN
  Administered 2016-05-28: 2 via RESPIRATORY_TRACT
  Filled 2016-05-28: qty 6.7

## 2016-05-28 NOTE — Discharge Instructions (Signed)
Complete the entire course of the antibiotic.  Stop taking your penicillin as the new antibiotic will give better coverage for your pneumonia infection.  Use 2 puffs of your inhaler given every 4 hours if needed for cough or wheezing.  You may take the cough medication prescribed every 12 hours, use caution as this medicine will make you drowsy.  Do not drive within 4 hours of taking this medication.

## 2016-05-28 NOTE — ED Triage Notes (Signed)
Pt reports that she has been coughing for 3 weeks. Pt had sinus infection around Easter and took Z pack and tessolon pearls. Pt reports frequent with clear mucous production.

## 2016-05-30 ENCOUNTER — Telehealth: Payer: Self-pay | Admitting: Family Medicine

## 2016-05-30 ENCOUNTER — Other Ambulatory Visit: Payer: Self-pay | Admitting: Family Medicine

## 2016-05-30 DIAGNOSIS — J189 Pneumonia, unspecified organism: Secondary | ICD-10-CM

## 2016-05-30 NOTE — Telephone Encounter (Signed)
Pls let her know I am aware she is being treated for pneumonia following ED visit on 4/1. Needs to keep her May appt and will need a rept CXR or that visit, I have ordered cXR for May 12 at [facility]

## 2016-06-01 NOTE — ED Provider Notes (Signed)
[facility] DEPT Provider Note   CSN: [id number] Arrival date & time: 05/28/16  1115     History   Chief Complaint Chief Complaint  Patient presents with  . Cough    HPI Kristin Blackwell is a 66 y.o. female presenting with a near 3 week history of cough which is sometime productive of clear sputum.  She was treated for a sinusitis with a z pack and was also prescribed tessalon which did not improve her symptoms. She was called in PCN this week. She also endorses occasional wheezing.  She denies fevers, chills, sob, chest pain or peripheral edema.  Her sinus pain and congestion has improved. She is a former smoker.   The history is provided by the patient.    Past Medical History:  Diagnosis Date  . Hyperlipidemia   . Nicotine addiction   . Overweight(278.02)     Patient Active Problem List   Diagnosis Date Noted  . Right otitis media 05/12/2016  . Hepatitis C antibody test positive 06/25/2015  . GERD (gastroesophageal reflux disease) 12/18/2014  . Obstructive sleep apnea 08/27/2014  . Allergic rhinitis 01/24/2013  . Metabolic syndrome X 95/18/8416  . Prediabetes 05/29/2012  . LBBB (left bundle branch block) 01/05/2012  . Hyperlipidemia LDL goal <100 08/05/2009  . Overweight (BMI 25.0-29.9) 04/22/2009    Past Surgical History:  Procedure Laterality Date  . PARTIAL HYSTERECTOMY  1987   had one ovary removed     OB History    No data available       Home Medications    Prior to Admission medications   Medication Sig Start Date End Date Taking? Authorizing Provider  Chlorphen-Phenyleph-ASA (ALKA-SELTZER PLUS COLD PO) Take 2 capsules by mouth every 4 (four) hours as needed (cough/cold).   Yes Historical Provider, MD  rosuvastatin (CRESTOR) 10 MG tablet TAKE 1 TABLET BY MOUTH EVERY DAY 04/15/16  Yes Fayrene Helper, MD  azithromycin (ZITHROMAX) 250 MG tablet Two tablets on day one, then one tablet once daily for an additional four days Patient not taking:  Reported on 05/28/2016 05/12/16   Fayrene Helper, MD  benzonatate (TESSALON) 100 MG capsule Take 1 capsule (100 mg total) by mouth 2 (two) times daily as needed for cough. Patient not taking: Reported on 05/28/2016 05/12/16   Fayrene Helper, MD  chlorpheniramine-HYDROcodone [facility]) 10-8 MG/5ML SUER Take 5 mLs by mouth every 12 (twelve) hours as needed for cough. 05/28/16   Evalee Jefferson, PA-C  levofloxacin (LEVAQUIN) 750 MG tablet Take 1 tablet (750 mg total) by mouth daily. 05/28/16   Evalee Jefferson, PA-C  loratadine (CLARITIN) 10 MG tablet Take 1 tablet (10 mg total) by mouth daily. Patient not taking: Reported on 05/28/2016 05/12/16   Fayrene Helper, MD    Family History Family History  Problem Relation Age of Onset  . Hypertension Mother   . Emphysema Father   . Hypertension Sister   . Diabetes Brother     oldest     Social History Social History  Substance Use Topics  . Smoking status: Former Smoker    Packs/day: 1.00    Types: Cigarettes  . Smokeless tobacco: Never Used     Comment: 1 week ago   . Alcohol use Yes     Comment: occassionally     Allergies   Ibandronate sodium; Lipitor [atorvastatin]; and Pravastatin   Review of Systems Review of Systems  Constitutional: Negative for chills and fever.  HENT: Positive for rhinorrhea. Negative  for congestion, ear pain, sinus pressure, sore throat, trouble swallowing and voice change.   Eyes: Negative for discharge.  Respiratory: Positive for cough and wheezing. Negative for chest tightness, shortness of breath and stridor.   Cardiovascular: Negative for chest pain, palpitations and leg swelling.  Gastrointestinal: Negative for abdominal pain.  Genitourinary: Negative.      Physical Exam Updated Vital Signs BP 135/77   Pulse 84   Temp 98.5 F (36.9 C) (Oral)   Resp 18   Ht 5\' 3"  (1.6 m)   Wt 80.3 kg   SpO2 96%   BMI 31.35 kg/m   Physical Exam  Constitutional: She is oriented to person,  place, and time. She appears well-developed and well-nourished.  HENT:  Head: Normocephalic and atraumatic.  Right Ear: Tympanic membrane and ear canal normal.  Left Ear: Tympanic membrane and ear canal normal.  Nose: Mucosal edema and rhinorrhea present.  Mouth/Throat: Uvula is midline, oropharynx is clear and moist and mucous membranes are normal. No oropharyngeal exudate, posterior oropharyngeal edema, posterior oropharyngeal erythema or tonsillar abscesses.  Eyes: Conjunctivae are normal.  Neck: Neck supple.  Cardiovascular: Normal rate and normal heart sounds.   Pulmonary/Chest: Effort normal. No respiratory distress. She has no wheezes. She has rhonchi in the left lower field. She has no rales. She exhibits no tenderness.  Abdominal: Soft. There is no tenderness.  Musculoskeletal: Normal range of motion. She exhibits no edema.  Neurological: She is alert and oriented to person, place, and time.  Skin: Skin is warm and dry. No rash noted.  Psychiatric: She has a normal mood and affect.     ED Treatments / Results  Labs (all labs ordered are listed, but only abnormal results are displayed) Labs Reviewed - No data to display  EKG  EKG Interpretation None       Radiology  Results for orders placed or performed in visit on 01/27/16  CBC  Result Value Ref Range   WBC 6.0 3.8 - 10.8 K/uL   RBC 4.25 3.80 - 5.10 MIL/uL   Hemoglobin 11.5 (L) 11.7 - 15.5 g/dL   HCT 35.2 35.0 - 45.0 %   MCV 82.8 80.0 - 100.0 fL   MCH 27.1 27.0 - 33.0 pg   MCHC 32.7 32.0 - 36.0 g/dL   RDW 15.7 (H) 11.0 - 15.0 %   Platelets 314 140 - 400 K/uL   MPV 9.7 7.5 - 12.5 fL  Lipid panel  Result Value Ref Range   Cholesterol 120 <200 mg/dL   Triglycerides 81 <150 mg/dL   HDL 42 (L) >50 mg/dL   Total CHOL/HDL Ratio 2.9 <5.0 Ratio   VLDL 16 <30 mg/dL   LDL Cholesterol 62 <100 mg/dL  Comprehensive metabolic panel  Result Value Ref Range   Sodium 140 135 - 146 mmol/L   Potassium 3.9 3.5 - 5.3  mmol/L   Chloride 110 98 - 110 mmol/L   CO2 23 20 - 31 mmol/L   Glucose, Bld 102 (H) 65 - 99 mg/dL   BUN 13 7 - 25 mg/dL   Creat 0.92 0.50 - 0.99 mg/dL   Total Bilirubin 0.4 0.2 - 1.2 mg/dL   Alkaline Phosphatase 69 33 - 130 U/L   AST 17 10 - 35 U/L   ALT 11 6 - 29 U/L   Total Protein 6.2 6.1 - 8.1 g/dL   Albumin 3.7 3.6 - 5.1 g/dL   Calcium 8.9 8.6 - 10.4 mg/dL  TSH  Result Value Ref Range  TSH 1.98 mIU/L  VITAMIN D 25 Hydroxy (Vit-D Deficiency, Fractures)  Result Value Ref Range   Vit D, 25-Hydroxy 39 30 - 100 ng/mL  Hemoglobin A1c  Result Value Ref Range   Hgb A1c MFr Bld 6.1 (H) <5.7 %   Mean Plasma Glucose 128 mg/dL   Dg Chest 2 View  Result Date: 05/28/2016 CLINICAL DATA:  Cough for 3 weeks. EXAM: CHEST  2 VIEW COMPARISON:  04/10/2010 FINDINGS: Airspace opacity noted in the lingula and left perihilar region compatible with pneumonia. Right lung is clear. Heart is normal size. No effusions. No acute bony abnormality. IMPRESSION: Left lingular/ perihilar airspace opacity compatible with pneumonia. Followup PA and lateral chest X-ray is recommended in 3-4 weeks following trial of antibiotic therapy to ensure resolution and exclude underlying malignancy. Electronically Signed   By: Rolm Baptise M.D.   On: 05/28/2016 11:52     Procedures Procedures (including critical care time)  Medications Ordered in ED Medications - No data to display   Initial Impression / Assessment and Plan / ED Course  I have reviewed the triage vital signs and the nursing notes.  Pertinent labs & imaging results that were available during my care of the patient were reviewed by me and considered in my medical decision making (see chart for details).     levaquin, tussionex, dc PCN.  Advised she needs recheck by pcp in one week,  Also advised will need repeat xray after sx resolved to ensure clearing of infection. Albuterol prn.  Final Clinical Impressions(s) / ED Diagnoses   Final diagnoses:    Community acquired pneumonia of left lower lobe of lung ([place])    New Prescriptions Discharge Medication List as of 05/28/2016  2:12 PM    START taking these medications   Details  chlorpheniramine-HYDROcodone (TUSSIONEX PENNKINETIC ER) 10-8 MG/5ML SUER Take 5 mLs by mouth every 12 (twelve) hours as needed for cough., Starting Thu 05/28/2016, Print    levofloxacin (LEVAQUIN) 750 MG tablet Take 1 tablet (750 mg total) by mouth daily., Starting Thu 05/28/2016, Print         Evalee Jefferson, PA-C 06/01/16 1234    Milton Ferguson, MD 06/09/16 2146

## 2016-06-02 NOTE — Telephone Encounter (Signed)
Sent via mychart

## 2016-06-29 ENCOUNTER — Encounter: Payer: Self-pay | Admitting: Family Medicine

## 2016-06-29 ENCOUNTER — Ambulatory Visit (INDEPENDENT_AMBULATORY_CARE_PROVIDER_SITE_OTHER): Payer: BC Managed Care – PPO | Admitting: Family Medicine

## 2016-06-29 ENCOUNTER — Ambulatory Visit (HOSPITAL_COMMUNITY)
Admission: RE | Admit: 2016-06-29 | Discharge: 2016-06-29 | Disposition: A | Discharge status: Home / Self Care | Payer: BC Managed Care – PPO | Source: Ambulatory Visit | Attending: Family Medicine | Admitting: Family Medicine

## 2016-06-29 VITALS — BP 120/82 | HR 72 | Resp 16 | Ht 63.0 in | Wt 175.0 lb

## 2016-06-29 DIAGNOSIS — R938 Abnormal findings on diagnostic imaging of other specified body structures: Secondary | ICD-10-CM | POA: Diagnosis not present

## 2016-06-29 DIAGNOSIS — E8881 Metabolic syndrome: Secondary | ICD-10-CM

## 2016-06-29 DIAGNOSIS — E663 Overweight: Secondary | ICD-10-CM

## 2016-06-29 DIAGNOSIS — R7303 Prediabetes: Secondary | ICD-10-CM

## 2016-06-29 DIAGNOSIS — Z23 Encounter for immunization: Secondary | ICD-10-CM | POA: Diagnosis not present

## 2016-06-29 DIAGNOSIS — E785 Hyperlipidemia, unspecified: Secondary | ICD-10-CM | POA: Diagnosis not present

## 2016-06-29 DIAGNOSIS — R9389 Abnormal findings on diagnostic imaging of other specified body structures: Secondary | ICD-10-CM | POA: Insufficient documentation

## 2016-06-29 DIAGNOSIS — J189 Pneumonia, unspecified organism: Secondary | ICD-10-CM

## 2016-06-29 NOTE — Assessment & Plan Note (Signed)
Repeat image past due, pt to get this today

## 2016-06-29 NOTE — Assessment & Plan Note (Signed)
Improved Patient re-educated about  the importance of commitment to a  minimum of 150 minutes of exercise per week.  The importance of healthy food choices with portion control discussed. Encouraged to start a food diary, count calories and to consider  joining a support group. Sample diet sheets offered. Goals set by the patient for the next several months.   Weight /BMI 06/29/2016 05/28/2016 05/12/2016  WEIGHT 175 lb 177 lb 177 lb  HEIGHT 5\' 3"  5\' 3"  5\' 6"   BMI 31 kg/m2 31.35 kg/m2 28.57 kg/m2

## 2016-06-29 NOTE — Assessment & Plan Note (Signed)
Hyperlipidemia:Low fat diet discussed and encouraged.   Lipid Panel  Lab Results  Component Value Date   CHOL 120 02/06/2016   HDL 42 (L) 02/06/2016   LDLCALC 62 02/06/2016   TRIG 81 02/06/2016   CHOLHDL 2.9 02/06/2016     Pt reports increased muscle aches of thighs and upper arms, has reduced crestor to 3 to 4 times weekly. Will repeat labs in June

## 2016-06-29 NOTE — Assessment & Plan Note (Addendum)
The increased risk of cardiovascular disease associated with this diagnosis, and the need to consistently work on lifestyle to change this is discussed. Following  a  heart healthy diet ,commitment to 30 minutes of exercise at least 5 days per week, as well as control of blood sugar and cholesterol , and achieving a healthy weight are all the areas to be addressed .  

## 2016-06-29 NOTE — Progress Notes (Signed)
Kristin Blackwell     MRN: [id number]      DOB: 1950/03/28   HPI Kristin Blackwell is here for follow up and re-evaluation of chronic medical conditions, medication management and review of any available recent lab and radiology data.  Preventive health is updated, specifically  Cancer screening and Immunization. c/o muscle aches in upper arms and thighs since change in crestor from one manufacturer to another  . Has reduced to taking this on average 3 to 4 days per week Continues to remain nicotine free, now 4 years,and is also losing weight   ROS Denies recent fever or chills. Denies sinus pressure, nasal congestion, ear pain or sore throat. Denies chest congestion, productive cough or wheezing. Denies chest pains, palpitations and leg swelling Denies abdominal pain, nausea, vomiting,diarrhea or constipation.   Denies dysuria, frequency, hesitancy or incontinence. Denies joint pain, swelling and limitation in mobility. Denies headaches, seizures, numbness, or tingling. Denies depression, anxiety or insomnia. Denies skin break down or rash.   PE  BP 120/82   Pulse 72   Resp 16   Ht 5\' 3"  (1.6 m)   Wt 175 lb (79.4 kg)   SpO2 97%   BMI 31.00 kg/m   Patient alert and oriented and in no cardiopulmonary distress.  HEENT: No facial asymmetry, EOMI,   oropharynx pink and moist.  Neck supple no JVD, no mass.  Chest: Clear to auscultation bilaterally.  CVS: S1, S2 no murmurs, no S3.Regular rate.  ABD: Soft non tender.   Ext: No edema  MS: Adequate ROM spine, shoulders, hips and knees.  Skin: Intact, no ulcerations or rash noted.  Psych: Good eye contact, normal affect. Memory intact not anxious or depressed appearing.  CNS: CN 2-12 intact, power,  normal throughout.no focal deficits noted.   Assessment & Plan  Need for vaccination with 13-polyvalent pneumococcal conjugate vaccine After obtaining informed consent, the vaccine is  administered by  LPN.   Hyperlipidemia LDL goal <100 Hyperlipidemia:Low fat diet discussed and encouraged.   Lipid Panel  Lab Results  Component Value Date   CHOL 120 02/06/2016   HDL 42 (L) 02/06/2016   LDLCALC 62 02/06/2016   TRIG 81 02/06/2016   CHOLHDL 2.9 02/06/2016     Pt reports increased muscle aches of thighs and upper arms, has reduced crestor to 3 to 4 times weekly. Will repeat labs in June  Abnormal CXR Repeat image past due, pt to get this today  Metabolic syndrome X  The increased risk of cardiovascular disease associated with this diagnosis, and the need to consistently work on lifestyle to change this is discussed. Following  a  heart healthy diet ,commitment to 30 minutes of exercise at least 5 days per week, as well as control of blood sugar and cholesterol , and achieving a healthy weight are all the areas to be addressed .     Overweight (BMI 25.0-29.9) Improved Patient re-educated about  the importance of commitment to a  minimum of 150 minutes of exercise per week.  The importance of healthy food choices with portion control discussed. Encouraged to start a food diary, count calories and to consider  joining a support group. Sample diet sheets offered. Goals set by the patient for the next several months.   Weight /BMI 06/29/2016 05/28/2016 05/12/2016  WEIGHT 175 lb 177 lb 177 lb  HEIGHT 5\' 3"  5\' 3"  5\' 6"   BMI 31 kg/m2 31.35 kg/m2 28.57 kg/m2      Prediabetes Patient educated about  the importance of limiting  Carbohydrate intake , the need to commit to daily physical activity for a minimum of 30 minutes , and to commit weight loss. The fact that changes in all these areas will reduce or eliminate all together the development of diabetes is stressed.   Diabetic Labs Latest Ref Rng & Units 02/06/2016 03/30/2015 08/24/2014 04/23/2014 11/22/2013  HbA1c <5.7 % 6.1(H) 6.3(H) 6.3(H) 6.4(H) 6.2(H)  Chol <200 mg/dL 120 169 - 140 247(H)  HDL >50 mg/dL 42(L) 50 - 48 47  Calc  LDL <100 mg/dL 62 102 - 69 173(H)  Triglycerides <150 mg/dL 81 86 - 116 134  Creatinine 0.50 - 0.99 mg/dL 0.92 - 0.71 0.87 0.88   BP/Weight 06/29/2016 05/28/2016 05/12/2016 01/27/2016 06/24/2015 04/25/2015 80/16/5537  Systolic BP 482 707 867 544 920 100 712  Diastolic BP 82 77 74 72 76 84 74  Wt. (Lbs) 175 177 177 181 176 178 180  BMI 31 31.35 28.57 29.21 28.42 28.74 29.07   No flowsheet data found.  Updated lab needed in June.

## 2016-06-29 NOTE — Assessment & Plan Note (Signed)
After obtaining informed consent, the vaccine is  administered by LPN.  

## 2016-06-29 NOTE — Patient Instructions (Signed)
f/u in 6 months, call if you need me before  Prevnar today  Continue taking the crestor as you are which is on average 3 to 4 times per week, if you eat mainly vegetable and fruit, then you probably will not need any lipid  Lowering medication at all    Fasting lipid, hepatic and hBA1C week of June 25   Please get the cXR today  Congrats on weight loss an continuing not to smoke!  Thank you  for choosing [facility]. We consider it a privelige to serve you.  Delivering excellent health care in a caring and  compassionate way is our goal.  Partnering with you,  so that together we can achieve this goal is our strategy.

## 2016-06-29 NOTE — Assessment & Plan Note (Addendum)
Patient educated about the importance of limiting  Carbohydrate intake , the need to commit to daily physical activity for a minimum of 30 minutes , and to commit weight loss. The fact that changes in all these areas will reduce or eliminate all together the development of diabetes is stressed.   Diabetic Labs Latest Ref Rng & Units 02/06/2016 03/30/2015 08/24/2014 04/23/2014 11/22/2013  HbA1c <5.7 % 6.1(H) 6.3(H) 6.3(H) 6.4(H) 6.2(H)  Chol <200 mg/dL 120 169 - 140 247(H)  HDL >50 mg/dL 42(L) 50 - 48 47  Calc LDL <100 mg/dL 62 102 - 69 173(H)  Triglycerides <150 mg/dL 81 86 - 116 134  Creatinine 0.50 - 0.99 mg/dL 0.92 - 0.71 0.87 0.88   BP/Weight 06/29/2016 05/28/2016 05/12/2016 01/27/2016 06/24/2015 04/25/2015 25/74/9355  Systolic BP 217 471 595 396 728 979 150  Diastolic BP 82 77 74 72 76 84 74  Wt. (Lbs) 175 177 177 181 176 178 180  BMI 31 31.35 28.57 29.21 28.42 28.74 29.07   No flowsheet data found.  Updated lab needed in June.

## 2016-06-30 ENCOUNTER — Encounter: Payer: Self-pay | Admitting: Family Medicine

## 2016-08-26 LAB — HEPATIC FUNCTION PANEL
ALT: 13 U/L (ref 6–29)
AST: 19 U/L (ref 10–35)
Albumin: 4 g/dL (ref 3.6–5.1)
Alkaline Phosphatase: 76 U/L (ref 33–130)
BILIRUBIN DIRECT: 0 mg/dL (ref <=0.2)
Indirect Bilirubin: 0.3 mg/dL (ref 0.2–1.2)
TOTAL PROTEIN: 7.1 g/dL (ref 6.1–8.1)
Total Bilirubin: 0.3 mg/dL (ref 0.2–1.2)

## 2016-08-26 LAB — LIPID PANEL
CHOL/HDL RATIO: 3.4 ratio (ref <5.0)
CHOLESTEROL: 181 mg/dL (ref <200)
HDL: 53 mg/dL (ref >50)
LDL Cholesterol: 109 mg/dL — ABNORMAL HIGH (ref <100)
TRIGLYCERIDES: 94 mg/dL (ref <150)
VLDL: 19 mg/dL (ref <30)

## 2016-08-26 LAB — HEMOGLOBIN A1C
HEMOGLOBIN A1C: 6.1 % — AB (ref <5.7)
MEAN PLASMA GLUCOSE: 128 mg/dL

## 2016-08-30 ENCOUNTER — Encounter: Payer: Self-pay | Admitting: Family Medicine

## 2016-08-30 ENCOUNTER — Telehealth: Payer: Self-pay | Admitting: Family Medicine

## 2016-08-30 DIAGNOSIS — E785 Hyperlipidemia, unspecified: Secondary | ICD-10-CM

## 2016-08-30 DIAGNOSIS — R5383 Other fatigue: Secondary | ICD-10-CM

## 2016-08-30 DIAGNOSIS — R7302 Impaired glucose tolerance (oral): Secondary | ICD-10-CM

## 2016-08-30 DIAGNOSIS — E8881 Metabolic syndrome: Secondary | ICD-10-CM

## 2016-08-30 NOTE — Telephone Encounter (Signed)
pls  Order CBC, fasting chem 7 lipid, cmp and HBA1C as well as TSH to be drawn Dec 23 or after and give to front desk to have it mailed to  to patient, who I am asking to also schedule an appointment for last week in  December  or early January   DX are hyperlipidemia, impaired glucose tolerance , metabolic syndrome, fatigue ??? Brandi / ,myself can assist

## 2016-08-31 NOTE — Telephone Encounter (Signed)
Labs ordered.

## 2016-12-14 ENCOUNTER — Encounter (HOSPITAL_COMMUNITY): Payer: Self-pay | Admitting: Emergency Medicine

## 2016-12-14 ENCOUNTER — Emergency Department (HOSPITAL_COMMUNITY): Payer: BC Managed Care – PPO

## 2016-12-14 ENCOUNTER — Emergency Department (HOSPITAL_COMMUNITY)
Admission: EM | Admit: 2016-12-14 | Discharge: 2016-12-14 | Disposition: A | Discharge status: Home / Self Care | Payer: BC Managed Care – PPO | Attending: Emergency Medicine | Admitting: Emergency Medicine

## 2016-12-14 DIAGNOSIS — S161XXA Strain of muscle, fascia and tendon at neck level, initial encounter: Secondary | ICD-10-CM | POA: Diagnosis present

## 2016-12-14 DIAGNOSIS — Y9389 Activity, other specified: Secondary | ICD-10-CM | POA: Insufficient documentation

## 2016-12-14 DIAGNOSIS — M25512 Pain in left shoulder: Secondary | ICD-10-CM | POA: Diagnosis not present

## 2016-12-14 DIAGNOSIS — Y999 Unspecified external cause status: Secondary | ICD-10-CM | POA: Diagnosis not present

## 2016-12-14 DIAGNOSIS — Z87891 Personal history of nicotine dependence: Secondary | ICD-10-CM | POA: Insufficient documentation

## 2016-12-14 DIAGNOSIS — R0789 Other chest pain: Secondary | ICD-10-CM | POA: Insufficient documentation

## 2016-12-14 DIAGNOSIS — Y929 Unspecified place or not applicable: Secondary | ICD-10-CM | POA: Insufficient documentation

## 2016-12-14 DIAGNOSIS — Z79899 Other long term (current) drug therapy: Secondary | ICD-10-CM | POA: Diagnosis not present

## 2016-12-14 MED ORDER — CYCLOBENZAPRINE HCL 5 MG PO TABS: 5.0000 mg | ORAL_TABLET | Freq: Two times a day (BID) | ORAL | 0 refills | Status: DC | PRN

## 2016-12-14 MED ORDER — IBUPROFEN 600 MG PO TABS: 600.0000 mg | ORAL_TABLET | Freq: Three times a day (TID) | ORAL | 0 refills | Status: DC
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
## 2016-12-14 NOTE — ED Notes (Signed)
Patient transported to X-ray 
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
## 2016-12-14 NOTE — ED Notes (Signed)
Pt ambulatory and independent at discharge.  Verbalized understanding of discharge instructions 

## 2017-01-18 ENCOUNTER — Telehealth: Payer: Self-pay | Admitting: Family Medicine

## 2017-01-18 NOTE — Telephone Encounter (Signed)
Called and left message to call back with more info. What symptoms is she having, how long, and any fever? Once info is received, will send to dr

## 2017-01-18 NOTE — Telephone Encounter (Signed)
Patient called in requesting a prescription. She says her face,teeth, and skin are all sensitive, she is having cough and congestion. Over the counter medication is not helping her. Cb#: [phone]

## 2017-01-18 NOTE — Telephone Encounter (Signed)
Needs clinical eval

## 2017-01-18 NOTE — Telephone Encounter (Signed)
Patient states started Saturday, face and teeth sore, sinus drainage, some cough, feeling bad scratchy sore throat, unsure if she has fever but has body aches. Offered appt this pm but doesn't feel like coming out. Please advise if something can be sent in

## 2017-01-19 ENCOUNTER — Ambulatory Visit (INDEPENDENT_AMBULATORY_CARE_PROVIDER_SITE_OTHER): Payer: BC Managed Care – PPO | Admitting: Family Medicine

## 2017-01-19 ENCOUNTER — Encounter: Payer: Self-pay | Admitting: Family Medicine

## 2017-01-19 VITALS — BP 114/78 | HR 86 | Temp 99.0°F | Resp 16 | Ht 66.0 in | Wt 182.0 lb

## 2017-01-19 DIAGNOSIS — J0100 Acute maxillary sinusitis, unspecified: Secondary | ICD-10-CM

## 2017-01-19 DIAGNOSIS — J209 Acute bronchitis, unspecified: Secondary | ICD-10-CM

## 2017-01-19 DIAGNOSIS — M25512 Pain in left shoulder: Secondary | ICD-10-CM | POA: Diagnosis not present

## 2017-01-19 DIAGNOSIS — E785 Hyperlipidemia, unspecified: Secondary | ICD-10-CM | POA: Diagnosis not present

## 2017-01-19 DIAGNOSIS — G8929 Other chronic pain: Secondary | ICD-10-CM

## 2017-01-19 DIAGNOSIS — E663 Overweight: Secondary | ICD-10-CM

## 2017-01-19 DIAGNOSIS — J3089 Other allergic rhinitis: Secondary | ICD-10-CM

## 2017-01-19 MED ORDER — PROMETHAZINE-DM 6.25-15 MG/5ML PO SYRP: ORAL_SOLUTION | ORAL | 0 refills | Status: DC

## 2017-01-19 MED ORDER — BENZONATATE 100 MG PO CAPS: 100.0000 mg | ORAL_CAPSULE | Freq: Two times a day (BID) | ORAL | 0 refills | Status: DC | PRN

## 2017-01-19 MED ORDER — RANITIDINE HCL 300 MG PO TABS: 300.0000 mg | ORAL_TABLET | Freq: Every day | ORAL | 0 refills | Status: DC

## 2017-01-19 MED ORDER — AZITHROMYCIN 250 MG PO TABS: ORAL_TABLET | ORAL | 0 refills | Status: DC

## 2017-01-19 MED ORDER — PREDNISONE 5 MG (21) PO TBPK: 5.0000 mg | ORAL_TABLET | ORAL | 0 refills | Status: DC

## 2017-01-19 MED ORDER — METHYLPREDNISOLONE ACETATE 80 MG/ML IJ SUSP
80.0000 mg | Freq: Once | INTRAMUSCULAR | Status: AC
  Administered 2017-01-19: 80 mg via INTRAMUSCULAR

## 2017-01-19 NOTE — Telephone Encounter (Signed)
Has to be seen. Work in early this pm
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
## 2017-01-24 DIAGNOSIS — M25512 Pain in left shoulder: Secondary | ICD-10-CM

## 2017-01-24 DIAGNOSIS — J019 Acute sinusitis, unspecified: Secondary | ICD-10-CM | POA: Insufficient documentation

## 2017-01-24 DIAGNOSIS — G8929 Other chronic pain: Secondary | ICD-10-CM | POA: Insufficient documentation

## 2017-01-24 NOTE — Assessment & Plan Note (Signed)
Z pack, and regular saline flushes to nares
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
## 2017-01-24 NOTE — Assessment & Plan Note (Signed)
Uncontrolled , DepoMedrol administered in office followed by prednisone dos pack

## 2017-01-24 NOTE — Assessment & Plan Note (Signed)
Updated lab needed at/ before next visit. Hyperlipidemia:Low fat diet discussed and encouraged.   Lipid Panel  Lab Results  Component Value Date   CHOL 181 08/25/2016   HDL 53 08/25/2016   LDLCALC 109 (H) 08/25/2016   TRIG 94 08/25/2016   CHOLHDL 3.4 08/25/2016

## 2017-01-24 NOTE — Assessment & Plan Note (Addendum)
Decongestant and Z pack prescribed also cough suppressant

## 2017-01-24 NOTE — Assessment & Plan Note (Signed)
Deteriorated. Patient re-educated about  the importance of commitment to a  minimum of 150 minutes of exercise per week.  The importance of healthy food choices with portion control discussed. Encouraged to start a food diary, count calories and to consider  joining a support group. Sample diet sheets offered. Goals set by the patient for the next several months.   Weight /BMI 01/19/2017 06/29/2016 05/28/2016  WEIGHT 182 lb 175 lb 177 lb  HEIGHT 5\' 6"  5\' 3"  5\' 3"   BMI 29.38 kg/m2 31 kg/m2 31.35 kg/m2

## 2017-02-03 ENCOUNTER — Telehealth: Payer: Self-pay | Admitting: Family Medicine

## 2017-02-03 NOTE — Telephone Encounter (Signed)
Called patient and left message to call back.

## 2017-02-03 NOTE — Telephone Encounter (Signed)
Pt calling in to advise that she is still coughing, and has congestion in her chest, she took her last does of medicine a week ago.

## 2017-02-04 NOTE — Telephone Encounter (Signed)
I advised pt to start back taking daily flonase and continue loratidine, shje is also to use oTC decongestant  klike alka seltzer sinus tablet one daily I was able to speak with her , she denies fever , chills or discolored sputum, I advised her to call back if any of this developed, she agrees and understands. No CXR now

## 2017-02-04 NOTE — Telephone Encounter (Signed)
Pls explain it takes a while for extra mucus to clear fully after an infection, may use OTC robitussin DM , without fever , or discolored sputum, no need for CXR. Prednisone 5 mg twice daily for 5 days will reduce the amount of mucus , I have entered this, you may send it if she wants it now, did not want it at the visit

## 2017-02-04 NOTE — Telephone Encounter (Signed)
Patient still coughing up mucus- states its clear and she has some hoarseness. Some night sweats but no temp that she knows of. Its better but still lingering. Wants to know what you want to do. Please advise

## 2017-02-17 ENCOUNTER — Ambulatory Visit: Payer: BC Managed Care – PPO | Admitting: Family Medicine

## 2017-02-25 ENCOUNTER — Encounter: Payer: Self-pay | Admitting: Family Medicine

## 2017-02-25 ENCOUNTER — Ambulatory Visit (INDEPENDENT_AMBULATORY_CARE_PROVIDER_SITE_OTHER): Payer: BC Managed Care – PPO | Admitting: Family Medicine

## 2017-02-25 VITALS — BP 120/78 | HR 81 | Temp 98.0°F | Resp 16 | Ht 66.0 in | Wt 180.0 lb

## 2017-02-25 DIAGNOSIS — E785 Hyperlipidemia, unspecified: Secondary | ICD-10-CM

## 2017-02-25 DIAGNOSIS — J3089 Other allergic rhinitis: Secondary | ICD-10-CM | POA: Diagnosis not present

## 2017-02-25 DIAGNOSIS — E663 Overweight: Secondary | ICD-10-CM

## 2017-02-25 DIAGNOSIS — R768 Other specified abnormal immunological findings in serum: Secondary | ICD-10-CM | POA: Diagnosis not present

## 2017-02-25 DIAGNOSIS — R7303 Prediabetes: Secondary | ICD-10-CM

## 2017-02-25 DIAGNOSIS — E8881 Metabolic syndrome: Secondary | ICD-10-CM

## 2017-02-25 MED ORDER — MONTELUKAST SODIUM 10 MG PO TABS: 10.0000 mg | ORAL_TABLET | Freq: Every day | ORAL | 3 refills | Status: DC

## 2017-02-25 MED ORDER — PROMETHAZINE-DM 6.25-15 MG/5ML PO SYRP: ORAL_SOLUTION | ORAL | 0 refills | Status: DC

## 2017-02-25 NOTE — Patient Instructions (Addendum)
Use one alka selzer plus and robitussin cough syrup at bedtime   New is singulair one daily, continue claritin one daily and flonase  Daily   Flush with saline once or twice daily  Control heat as best as possible at home and limit exposure to allergens   cough sup[pressant syrup is prescribed fill and use if needed  PLEASE get  labs itoday we will add lipid and hepatic Thanks for choosing [facility], we consider it a privelige to serve you.   F/U in 6 months

## 2017-02-26 ENCOUNTER — Encounter: Payer: Self-pay | Admitting: Family Medicine

## 2017-02-26 LAB — HEMOGLOBIN A1C
Hgb A1c MFr Bld: 6.1 % of total Hgb — ABNORMAL HIGH (ref <5.7)
MEAN PLASMA GLUCOSE: 128 (calc)
eAG (mmol/L): 7.1 (calc)

## 2017-02-26 LAB — HEPATIC FUNCTION PANEL
AG Ratio: 1.3 (calc) (ref 1.0–2.5)
ALBUMIN MSPROF: 4.4 g/dL (ref 3.6–5.1)
ALT: 17 U/L (ref 6–29)
AST: 18 U/L (ref 10–35)
Alkaline phosphatase (APISO): 85 U/L (ref 33–130)
Bilirubin, Direct: 0.1 mg/dL (ref 0.0–0.2)
GLOBULIN: 3.3 g/dL (ref 1.9–3.7)
Indirect Bilirubin: 0.2 mg/dL (calc) (ref 0.2–1.2)
TOTAL PROTEIN: 7.7 g/dL (ref 6.1–8.1)
Total Bilirubin: 0.3 mg/dL (ref 0.2–1.2)

## 2017-02-26 LAB — COMPREHENSIVE METABOLIC PANEL
AG Ratio: 1.3 (calc) (ref 1.0–2.5)
ALKALINE PHOSPHATASE (APISO): 85 U/L (ref 33–130)
ALT: 17 U/L (ref 6–29)
AST: 18 U/L (ref 10–35)
Albumin: 4.4 g/dL (ref 3.6–5.1)
BILIRUBIN TOTAL: 0.3 mg/dL (ref 0.2–1.2)
BUN: 15 mg/dL (ref 7–25)
CHLORIDE: 104 mmol/L (ref 98–110)
CO2: 27 mmol/L (ref 20–32)
CREATININE: 0.8 mg/dL (ref 0.50–0.99)
Calcium: 9.5 mg/dL (ref 8.6–10.4)
GLOBULIN: 3.3 g/dL (ref 1.9–3.7)
Glucose, Bld: 84 mg/dL (ref 65–139)
Potassium: 4 mmol/L (ref 3.5–5.3)
SODIUM: 139 mmol/L (ref 135–146)
TOTAL PROTEIN: 7.7 g/dL (ref 6.1–8.1)

## 2017-02-26 LAB — LIPID PANEL
CHOL/HDL RATIO: 2.6 (calc) (ref <5.0)
CHOLESTEROL: 176 mg/dL (ref <200)
HDL: 67 mg/dL (ref >50)
LDL CHOLESTEROL (CALC): 90 mg/dL
Non-HDL Cholesterol (Calc): 109 mg/dL (calc) (ref <130)
TRIGLYCERIDES: 96 mg/dL (ref <150)

## 2017-02-26 LAB — CBC
HEMATOCRIT: 38.9 % (ref 35.0–45.0)
HEMOGLOBIN: 12.6 g/dL (ref 11.7–15.5)
MCH: 25.9 pg — AB (ref 27.0–33.0)
MCHC: 32.4 g/dL (ref 32.0–36.0)
MCV: 80 fL (ref 80.0–100.0)
MPV: 10.6 fL (ref 7.5–12.5)
Platelets: 335 10*3/uL (ref 140–400)
RBC: 4.86 10*6/uL (ref 3.80–5.10)
RDW: 15.2 % — AB (ref 11.0–15.0)
WBC: 7.7 10*3/uL (ref 3.8–10.8)

## 2017-02-26 LAB — TSH: TSH: 1.39 mIU/L (ref 0.40–4.50)

## 2017-02-26 NOTE — Assessment & Plan Note (Signed)
Uncontrolled symptoms, add Singulair daily, pt to use daily flonase and start saline nasal flushes also, she is to continue claritin, aljka selzer , and may use phenergan DM for uncontrolled cough

## 2017-02-26 NOTE — Progress Notes (Signed)
Kristin Blackwell     MRN: [id number]      DOB: [birth year]   HPI Kristin Blackwell is here for follow up and re-evaluation of chronic medical conditions, medication management and review of any available recent lab and radiology data.  Preventive health is updated, specifically  Cancer screening and Immunization.  Colonoscopy needs to be done Questions or concerns regarding consultations or procedures which the PT has had in the interim are  addressed. The PT denies any adverse reactions to current medications since the last visit.  C/o chronic nasal congestions and cough, no fever or chills, does not feel ill, feels that the symptoms are aggravated by dry heat in her home and the fact that the room temp is kept in the 80's by her ill spouse , also home is heated with wood heat Has been using alka selzer plus and robitussin and Claritin Takes cholesteorl medication alternated days   ROS  Denies chest pains, palpitations and leg swelling Denies abdominal pain, nausea, vomiting,diarrhea or constipation.   Denies dysuria, frequency, hesitancy or incontinence. Denies joint pain, swelling and limitation in mobility. Denies headaches, seizures, numbness, or tingling.  Denies skin break down or rash.   PE  BP 120/78   Pulse 81   Temp 98 F (36.7 C) (Oral)   Resp 16   Ht 5\' 6"  (1.676 m)   Wt 180 lb (81.6 kg)   SpO2 95%   BMI 29.05 kg/m   Patient alert and oriented and in no cardiopulmonary distress.  HEENT: No facial asymmetry, EOMI,   oropharynx pink and moist.  Neck supple no JVD, no mass.  Chest: Clear to auscultation bilaterally.  CVS: S1, S2 no murmurs, no S3.Regular rate.  ABD: Soft non tender.   Ext: No edema  MS: Adequate ROM spine, shoulders, hips and knees.  Skin: Intact, no ulcerations or rash noted.  Psych: Good eye contact, normal affect. Memory intact not anxious or depressed appearing.  CNS: CN 2-12 intact, power,  normal throughout.no focal deficits  noted.   Assessment & Plan  Allergic rhinitis Uncontrolled symptoms, add Singulair daily, pt to use daily flonase and start saline nasal flushes also, she is to continue claritin, aljka selzer , and may use phenergan DM for uncontrolled cough  Hyperlipidemia LDL goal <100 Hyperlipidemia:Low fat diet discussed and encouraged.   Lipid Panel  Lab Results  Component Value Date   CHOL 176 02/25/2017   HDL 67 02/25/2017   LDLCALC 109 (H) 08/25/2016   TRIG 96 02/25/2017   CHOLHDL 2.6 02/25/2017    Need updated LDL will add on No med chnage   Prediabetes Unchanged since last checked needs to work on lowering blood sugar more consisitently Patient educated about the importance of limiting  Carbohydrate intake , the need to commit to daily physical activity for a minimum of 30 minutes , and to commit weight loss. The fact that changes in all these areas will reduce or eliminate all together the development of diabetes is stressed.   Diabetic Labs Latest Ref Rng & Units 02/25/2017 08/25/2016 02/06/2016 03/30/2015 08/24/2014  HbA1c <5.7 % of total Hgb 6.1(H) 6.1(H) 6.1(H) 6.3(H) 6.3(H)  Chol <200 mg/dL 176 181 120 169 -  HDL >50 mg/dL 67 53 42(L) 50 -  Calc LDL <100 mg/dL - 109(H) 62 102 -  Triglycerides <150 mg/dL 96 94 81 86 -  Creatinine 0.50 - 0.99 mg/dL 0.80 - 0.92 - 0.71   BP/Weight 02/25/2017 01/19/2017 12/14/2016 06/29/2016 05/28/2016 05/12/2016  74/73/4037  Systolic BP 096 438 381 840 375 436 067  Diastolic BP 78 78 87 82 77 74 72  Wt. (Lbs) 180 182 - 175 177 177 181  BMI 29.05 29.38 - 31 31.35 28.57 29.21   No flowsheet data found.    Overweight (BMI 25.0-29.9) Unchnaged. Patient re-educated about  the importance of commitment to a  minimum of 150 minutes of exercise per week.  The importance of healthy food choices with portion control discussed. Encouraged to start a food diary, count calories and to consider  joining a support group. Sample diet sheets offered. Goals set  by the patient for the next several months.   Weight /BMI 02/25/2017 01/19/2017 06/29/2016  WEIGHT 180 lb 182 lb 175 lb  HEIGHT 5\' 6"  5\' 6"  5\' 3"   BMI 29.05 kg/m2 29.38 kg/m2 31 kg/m2      Metabolic syndrome X The increased risk of cardiovascular disease associated with this diagnosis, and the need to consistently work on lifestyle to change this is discussed. Following  a  heart healthy diet ,commitment to 30 minutes of exercise at least 5 days per week, as well as control of blood sugar and cholesterol , and achieving a healthy weight are all the areas to be addressed .

## 2017-02-26 NOTE — Assessment & Plan Note (Signed)
The increased risk of cardiovascular disease associated with this diagnosis, and the need to consistently work on lifestyle to change this is discussed. Following  a  heart healthy diet ,commitment to 30 minutes of exercise at least 5 days per week, as well as control of blood sugar and cholesterol , and achieving a healthy weight are all the areas to be addressed .  

## 2017-02-26 NOTE — Assessment & Plan Note (Signed)
Unchnaged. Patient re-educated about  the importance of commitment to a  minimum of 150 minutes of exercise per week.  The importance of healthy food choices with portion control discussed. Encouraged to start a food diary, count calories and to consider  joining a support group. Sample diet sheets offered. Goals set by the patient for the next several months.   Weight /BMI 02/25/2017 01/19/2017 06/29/2016  WEIGHT 180 lb 182 lb 175 lb  HEIGHT 5\' 6"  5\' 6"  5\' 3"   BMI 29.05 kg/m2 29.38 kg/m2 31 kg/m2

## 2017-02-26 NOTE — Assessment & Plan Note (Signed)
Hyperlipidemia:Low fat diet discussed and encouraged.   Lipid Panel  Lab Results  Component Value Date   CHOL 176 02/25/2017   HDL 67 02/25/2017   LDLCALC 109 (H) 08/25/2016   TRIG 96 02/25/2017   CHOLHDL 2.6 02/25/2017    Need updated LDL will add on No med chnage

## 2017-02-26 NOTE — Assessment & Plan Note (Signed)
Unchanged since last checked needs to work on lowering blood sugar more consisitently Patient educated about the importance of limiting  Carbohydrate intake , the need to commit to daily physical activity for a minimum of 30 minutes , and to commit weight loss. The fact that changes in all these areas will reduce or eliminate all together the development of diabetes is stressed.   Diabetic Labs Latest Ref Rng & Units 02/25/2017 08/25/2016 02/06/2016 03/30/2015 08/24/2014  HbA1c <5.7 % of total Hgb 6.1(H) 6.1(H) 6.1(H) 6.3(H) 6.3(H)  Chol <200 mg/dL 176 181 120 169 -  HDL >50 mg/dL 67 53 42(L) 50 -  Calc LDL <100 mg/dL - 109(H) 62 102 -  Triglycerides <150 mg/dL 96 94 81 86 -  Creatinine 0.50 - 0.99 mg/dL 0.80 - 0.92 - 0.71   BP/Weight 02/25/2017 01/19/2017 12/14/2016 06/29/2016 05/28/2016 05/12/2016 41/28/7867  Systolic BP 672 094 709 628 366 294 765  Diastolic BP 78 78 87 82 77 74 72  Wt. (Lbs) 180 182 - 175 177 177 181  BMI 29.05 29.38 - 31 31.35 28.57 29.21   No flowsheet data found.

## 2017-05-22 ENCOUNTER — Other Ambulatory Visit: Payer: Self-pay | Admitting: Family Medicine

## 2017-06-05 ENCOUNTER — Other Ambulatory Visit: Payer: Self-pay | Admitting: Family Medicine

## 2017-06-10 ENCOUNTER — Telehealth: Payer: Self-pay | Admitting: Family Medicine

## 2017-06-10 NOTE — Telephone Encounter (Signed)
Patient called in to request a prescription cough medication because the over the counter cough meds. Are not helping her. Says she has the same symptoms as the last time she was here to see Dr.Simpson. [facility] [address] Cb#: [phone]

## 2017-06-10 NOTE — Telephone Encounter (Signed)
Please advise. Thank you   -Abby S.  

## 2017-06-11 ENCOUNTER — Other Ambulatory Visit: Payer: Self-pay | Admitting: Family Medicine

## 2017-06-11 MED ORDER — PROMETHAZINE-DM 6.25-15 MG/5ML PO SYRP
ORAL_SOLUTION | ORAL | 1 refills | Status: DC
Start: 2017-06-11 — End: 2017-11-02

## 2017-06-11 NOTE — Telephone Encounter (Signed)
pls let her know medication is sent to the pharmacy, sorry for delay

## 2017-06-11 NOTE — Progress Notes (Signed)
ph

## 2017-06-11 NOTE — Telephone Encounter (Signed)
Spoke with patient and advised Dr.Simpson had sent in a prescription to the pharmacy with verbal understanding.

## 2017-08-16 ENCOUNTER — Other Ambulatory Visit: Payer: Self-pay

## 2017-08-16 ENCOUNTER — Encounter: Payer: Self-pay | Admitting: Family Medicine

## 2017-08-16 ENCOUNTER — Ambulatory Visit (INDEPENDENT_AMBULATORY_CARE_PROVIDER_SITE_OTHER): Payer: BC Managed Care – PPO | Admitting: Family Medicine

## 2017-08-16 VITALS — BP 110/70 | HR 81 | Ht 66.0 in | Wt 185.1 lb

## 2017-08-16 DIAGNOSIS — E559 Vitamin D deficiency, unspecified: Secondary | ICD-10-CM | POA: Diagnosis not present

## 2017-08-16 DIAGNOSIS — E663 Overweight: Secondary | ICD-10-CM

## 2017-08-16 DIAGNOSIS — E8881 Metabolic syndrome: Secondary | ICD-10-CM | POA: Diagnosis not present

## 2017-08-16 DIAGNOSIS — Z1211 Encounter for screening for malignant neoplasm of colon: Secondary | ICD-10-CM | POA: Diagnosis not present

## 2017-08-16 DIAGNOSIS — E785 Hyperlipidemia, unspecified: Secondary | ICD-10-CM

## 2017-08-16 DIAGNOSIS — J3089 Other allergic rhinitis: Secondary | ICD-10-CM | POA: Diagnosis not present

## 2017-08-16 DIAGNOSIS — H6121 Impacted cerumen, right ear: Secondary | ICD-10-CM

## 2017-08-16 DIAGNOSIS — Z1331 Encounter for screening for depression: Secondary | ICD-10-CM | POA: Diagnosis not present

## 2017-08-16 DIAGNOSIS — Z23 Encounter for immunization: Secondary | ICD-10-CM | POA: Diagnosis not present

## 2017-08-16 DIAGNOSIS — R7303 Prediabetes: Secondary | ICD-10-CM | POA: Diagnosis not present

## 2017-08-16 NOTE — Patient Instructions (Addendum)
F/U in early January, call if you need me before  Pneumonia 23 vaccine today  You are referred to your GI Doc  In [facility] for your colonoscopy can follow up on this  Your right  ear has wax,  so you please do not stick anything in there that could puncture your ear drum, just use softener and give it time, if you cannot wait/ if a problem , call for ENT help  Please start regular exercise and cHANGE food choice , need to lose 10 pounds by next year  Fasting lipid, cmp and EGFR, vit D  Next week , I will send result via my chart  ,Thank you  for choosing [facility]. We consider it a privelige to serve you.  Delivering excellent health care in a caring and  compassionate way is our goal.  Partnering with you,  so that together we can achieve this goal is our strategy.

## 2017-08-17 ENCOUNTER — Encounter: Payer: Self-pay | Admitting: Family Medicine

## 2017-08-17 DIAGNOSIS — Z1331 Encounter for screening for depression: Secondary | ICD-10-CM | POA: Insufficient documentation

## 2017-08-17 DIAGNOSIS — H6120 Impacted cerumen, unspecified ear: Secondary | ICD-10-CM | POA: Insufficient documentation

## 2017-08-17 NOTE — Assessment & Plan Note (Signed)
Patient is screened, and her score is zero, she is not depressed

## 2017-08-17 NOTE — Assessment & Plan Note (Signed)
Controlled, no change in medication  

## 2017-08-17 NOTE — Assessment & Plan Note (Signed)
The increased risk of cardiovascular disease associated with this diagnosis, and the need to consistently work on lifestyle to change this is discussed. Following  a  heart healthy diet ,commitment to 30 minutes of exercise at least 5 days per week, as well as control of blood sugar and cholesterol , and achieving a healthy weight are all the areas to be addressed .  

## 2017-08-17 NOTE — Assessment & Plan Note (Signed)
After obtaining informed consent, the vaccine is  administered by LPN.  

## 2017-08-17 NOTE — Assessment & Plan Note (Signed)
Deteriorated. Patient re-educated about  the importance of commitment to a  minimum of 150 minutes of exercise per week.  The importance of healthy food choices with portion control discussed. Encouraged to start a food diary, count calories and to consider  joining a support group. Sample diet sheets offered. Goals set by the patient for the next several months.   Weight /BMI 08/16/2017 02/25/2017 01/19/2017  WEIGHT 185 lb 1.3 oz 180 lb 182 lb  HEIGHT 5\' 6"  5\' 6"  5\' 6"   BMI 29.87 kg/m2 29.05 kg/m2 29.38 kg/m2

## 2017-08-17 NOTE — Assessment & Plan Note (Signed)
Patient educated about the importance of limiting  Carbohydrate intake , the need to commit to daily physical activity for a minimum of 30 minutes , and to commit weight loss. The fact that changes in all these areas will reduce or eliminate all together the development of diabetes is stressed.   Diabetic Labs Latest Ref Rng & Units 02/25/2017 08/25/2016 02/06/2016 03/30/2015 08/24/2014  HbA1c <5.7 % of total Hgb 6.1(H) 6.1(H) 6.1(H) 6.3(H) 6.3(H)  Chol <200 mg/dL 176 181 120 169 -  HDL >50 mg/dL 67 53 42(L) 50 -  Calc LDL mg/dL (calc) 90 109(H) 62 102 -  Triglycerides <150 mg/dL 96 94 81 86 -  Creatinine 0.50 - 0.99 mg/dL 0.80 - 0.92 - 0.71   BP/Weight 08/16/2017 02/25/2017 01/19/2017 12/14/2016 06/29/2016 05/28/2016 0/93/8182  Systolic BP 993 716 967 893 810 175 102  Diastolic BP 70 78 78 87 82 77 74  Wt. (Lbs) 185.08 180 182 - 175 177 177  BMI 29.87 29.05 29.38 - 31 31.35 28.57   No flowsheet data found.  Updated lab needed at/ before next visit.

## 2017-08-17 NOTE — Assessment & Plan Note (Signed)
Hyperlipidemia:Low fat diet discussed and encouraged.   Lipid Panel  Lab Results  Component Value Date   CHOL 176 02/25/2017   HDL 67 02/25/2017   LDLCALC 90 02/25/2017   TRIG 96 02/25/2017   CHOLHDL 2.6 02/25/2017     Updated lab needed at/ before next visit. Controlled, when last checked, pt actually takes medication 3 to 4 times weekly

## 2017-08-17 NOTE — Assessment & Plan Note (Signed)
Patient to use OTC wax softener and allow wax to work its way out she is advised against q tips and hairpins and warned of real risk of damage to the ear. She will call for ENT referral if needed

## 2017-08-17 NOTE — Progress Notes (Signed)
Kristin Blackwell     MRN: [id number]      DOB: [birth year]   HPI Kristin Blackwell is here for follow up and re-evaluation of chronic medical conditions, medication management and review of any available recent lab and radiology data.  Preventive health is updated, specifically  Cancer screening and Immunization.  Needs colonoscopy and also her 2 nd pneumonia vaccine Has upcoming gyne exam  The PT denies any adverse reactions to current medications since the last visit.  C/o right ear discomfort and a feeling of fullness, has been sticking a hairpin in her ear at times, no hearing loss Increased allergy symptoms are managed with flonase, uses claritin and singulair almost daily Frustrated with weight , tries to watch her diet , but admits that exercise is next to none ROS Denies recent fever or chills.  Denies chest congestion, productive cough or wheezing. Denies chest pains, palpitations and leg swelling Denies abdominal pain, nausea, vomiting,diarrhea or constipation.   Denies dysuria, frequency, hesitancy or incontinence. Denies joint pain, swelling and limitation in mobility. Denies headaches, seizures, numbness, or tingling. Denies depression, anxiety or insomnia. Denies skin break down or rash.   PE  BP 110/70   Pulse 81   Ht 5\' 6"  (1.676 m)   Wt 185 lb 1.3 oz (84 kg)   BMI 29.87 kg/m   Patient alert and oriented and in no cardiopulmonary distress.  HEENT: No facial asymmetry, EOMI,   oropharynx pink and moist.  Neck supple no JVD, no mass.Right cerumen impaction, left  clear, no sinus tenderness  Chest: Clear to auscultation bilaterally.  CVS: S1, S2 no murmurs, no S3.Regular rate.  ABD: Soft non tender.   Ext: No edema  MS: Adequate ROM spine, shoulders, hips and knees.  Skin: Intact, no ulcerations or rash noted.  Psych: Good eye contact, normal affect. Memory intact not anxious or depressed appearing.  CNS: CN 2-12 intact, power,  normal throughout.no  focal deficits noted.   Assessment & Plan  Hyperlipidemia LDL goal <100 Hyperlipidemia:Low fat diet discussed and encouraged.   Lipid Panel  Lab Results  Component Value Date   CHOL 176 02/25/2017   HDL 67 02/25/2017   LDLCALC 90 02/25/2017   TRIG 96 02/25/2017   CHOLHDL 2.6 02/25/2017     Updated lab needed at/ before next visit. Controlled, when last checked, pt actually takes medication 3 to 4 times weekly  Prediabetes Patient educated about the importance of limiting  Carbohydrate intake , the need to commit to daily physical activity for a minimum of 30 minutes , and to commit weight loss. The fact that changes in all these areas will reduce or eliminate all together the development of diabetes is stressed.   Diabetic Labs Latest Ref Rng & Units 02/25/2017 08/25/2016 02/06/2016 03/30/2015 08/24/2014  HbA1c <5.7 % of total Hgb 6.1(H) 6.1(H) 6.1(H) 6.3(H) 6.3(H)  Chol <200 mg/dL 176 181 120 169 -  HDL >50 mg/dL 67 53 42(L) 50 -  Calc LDL mg/dL (calc) 90 109(H) 62 102 -  Triglycerides <150 mg/dL 96 94 81 86 -  Creatinine 0.50 - 0.99 mg/dL 0.80 - 0.92 - 0.71   BP/Weight 08/16/2017 02/25/2017 01/19/2017 12/14/2016 06/29/2016 05/28/2016 06/04/6220  Systolic BP 979 892 119 417 408 144 818  Diastolic BP 70 78 78 87 82 77 74  Wt. (Lbs) 185.08 180 182 - 175 177 177  BMI 29.87 29.05 29.38 - 31 31.35 28.57   No flowsheet data found.  Updated lab needed at/  before next visit.   Overweight (BMI 25.0-29.9) Deteriorated. Patient re-educated about  the importance of commitment to a  minimum of 150 minutes of exercise per week.  The importance of healthy food choices with portion control discussed. Encouraged to start a food diary, count calories and to consider  joining a support group. Sample diet sheets offered. Goals set by the patient for the next several months.   Weight /BMI 08/16/2017 02/25/2017 01/19/2017  WEIGHT 185 lb 1.3 oz 180 lb 182 lb  HEIGHT 5\' 6"  5\' 6"  5\' 6"   BMI 29.87  kg/m2 29.05 kg/m2 29.38 kg/m2      Screening for depression Patient is screened, and her score is zero, she is not depressed  Metabolic syndrome X The increased risk of cardiovascular disease associated with this diagnosis, and the need to consistently work on lifestyle to change this is discussed. Following  a  heart healthy diet ,commitment to 30 minutes of exercise at least 5 days per week, as well as control of blood sugar and cholesterol , and achieving a healthy weight are all the areas to be addressed .   Allergic rhinitis Controlled, no change in medication   Cerumen impaction Patient to use OTC wax softener and allow wax to work its way out she is advised against q tips and hairpins and warned of real risk of damage to the ear. She will call for ENT referral if needed

## 2017-08-28 LAB — COMPLETE METABOLIC PANEL WITH GFR
AG Ratio: 1.3 (calc) (ref 1.0–2.5)
ALBUMIN MSPROF: 4.1 g/dL (ref 3.6–5.1)
ALT: 13 U/L (ref 6–29)
AST: 18 U/L (ref 10–35)
Alkaline phosphatase (APISO): 75 U/L (ref 33–130)
BILIRUBIN TOTAL: 0.3 mg/dL (ref 0.2–1.2)
BUN: 16 mg/dL (ref 7–25)
CHLORIDE: 108 mmol/L (ref 98–110)
CO2: 25 mmol/L (ref 20–32)
Calcium: 9.4 mg/dL (ref 8.6–10.4)
Creat: 0.87 mg/dL (ref 0.50–0.99)
GFR, EST AFRICAN AMERICAN: 80 mL/min/{1.73_m2} (ref >=60)
GFR, Est Non African American: 69 mL/min/{1.73_m2} (ref >=60)
GLOBULIN: 3.1 g/dL (ref 1.9–3.7)
GLUCOSE: 102 mg/dL (ref 65–139)
Potassium: 4.3 mmol/L (ref 3.5–5.3)
SODIUM: 140 mmol/L (ref 135–146)
TOTAL PROTEIN: 7.2 g/dL (ref 6.1–8.1)

## 2017-08-28 LAB — VITAMIN D 25 HYDROXY (VIT D DEFICIENCY, FRACTURES): VIT D 25 HYDROXY: 29 ng/mL — AB (ref 30–100)

## 2017-09-06 ENCOUNTER — Telehealth: Payer: Self-pay

## 2017-09-06 ENCOUNTER — Telehealth: Payer: Self-pay | Admitting: Family Medicine

## 2017-09-06 DIAGNOSIS — E785 Hyperlipidemia, unspecified: Secondary | ICD-10-CM

## 2017-09-06 NOTE — Telephone Encounter (Signed)
lmom needs to go back to lab and have additional lab drawn that was not done when you went to lab.  Order is in [place] now.

## 2017-09-06 NOTE — Telephone Encounter (Signed)
Labs ordered.

## 2017-11-02 ENCOUNTER — Other Ambulatory Visit: Payer: Self-pay

## 2017-11-02 MED ORDER — PROMETHAZINE-DM 6.25-15 MG/5ML PO SYRP: ORAL_SOLUTION | ORAL | 0 refills | Status: DC

## 2017-12-09 ENCOUNTER — Other Ambulatory Visit: Payer: Self-pay | Admitting: Family Medicine

## 2018-01-26 ENCOUNTER — Telehealth: Payer: Self-pay | Admitting: *Deleted

## 2018-01-26 NOTE — Telephone Encounter (Signed)
Pt called with sinus problems. Has a lot of head congestion. No fever but has had a cough for awhile. Would like something called in for the cough.

## 2018-01-27 ENCOUNTER — Other Ambulatory Visit: Payer: Self-pay | Admitting: Family Medicine

## 2018-01-27 MED ORDER — PROMETHAZINE-DM 6.25-15 MG/5ML PO SYRP: ORAL_SOLUTION | ORAL | 0 refills | Status: DC

## 2018-01-27 MED ORDER — BENZONATATE 100 MG PO CAPS: 100.0000 mg | ORAL_CAPSULE | Freq: Two times a day (BID) | ORAL | 0 refills | Status: DC | PRN

## 2018-01-27 NOTE — Telephone Encounter (Signed)
Please advise 

## 2018-01-27 NOTE — Telephone Encounter (Signed)
Done/patient aware. 

## 2018-01-27 NOTE — Telephone Encounter (Signed)
Please let her know cough suppressant syrup and decongestant pills have been sent to her pharmacy

## 2018-02-18 ENCOUNTER — Encounter: Payer: Self-pay | Admitting: Family Medicine

## 2018-02-18 LAB — LIPID PANEL
CHOLESTEROL: 167 mg/dL (ref <200)
HDL: 51 mg/dL (ref >50)
LDL Cholesterol (Calc): 97 mg/dL (calc)
NON-HDL CHOLESTEROL (CALC): 116 mg/dL (ref <130)
TRIGLYCERIDES: 91 mg/dL (ref <150)
Total CHOL/HDL Ratio: 3.3 (calc) (ref <5.0)

## 2018-02-24 ENCOUNTER — Encounter: Payer: Self-pay | Admitting: Family Medicine

## 2018-02-24 ENCOUNTER — Ambulatory Visit (INDEPENDENT_AMBULATORY_CARE_PROVIDER_SITE_OTHER): Payer: BC Managed Care – PPO | Admitting: Family Medicine

## 2018-02-24 VITALS — BP 110/70 | HR 70 | Resp 12 | Ht 64.0 in | Wt 183.0 lb

## 2018-02-24 DIAGNOSIS — E785 Hyperlipidemia, unspecified: Secondary | ICD-10-CM

## 2018-02-24 DIAGNOSIS — R7303 Prediabetes: Secondary | ICD-10-CM

## 2018-02-24 DIAGNOSIS — E8881 Metabolic syndrome: Secondary | ICD-10-CM

## 2018-02-24 DIAGNOSIS — J3089 Other allergic rhinitis: Secondary | ICD-10-CM

## 2018-02-24 DIAGNOSIS — E663 Overweight: Secondary | ICD-10-CM

## 2018-02-24 DIAGNOSIS — E559 Vitamin D deficiency, unspecified: Secondary | ICD-10-CM

## 2018-02-24 MED ORDER — ROSUVASTATIN CALCIUM 10 MG PO TABS: ORAL_TABLET | ORAL | 3 refills | Status: DC

## 2018-02-24 MED ORDER — MONTELUKAST SODIUM 10 MG PO TABS: 10.0000 mg | ORAL_TABLET | Freq: Every day | ORAL | 3 refills | Status: DC

## 2018-02-24 NOTE — Patient Instructions (Addendum)
F/U in 6 months, call if you need me before   Fasting lipid, cmp and EGFr, CBC ,  HBa1C, TSH and vit D 1 week before next visit    Colonoscopy is needed and is past due, please follow through!  It is important that you exercise regularly at least 30 minutes 5 times a week. If you develop chest pain, have severe difficulty breathing, or feel very tired, stop exercising immediately and seek medical attention  Thank you  for choosing [facility]. We consider it a privelige to serve you.  Delivering excellent health care in a caring and  compassionate way is our goal.  Partnering with you,  so that together we can achieve this goal is our strategy.

## 2018-02-27 ENCOUNTER — Encounter: Payer: Self-pay | Admitting: Family Medicine

## 2018-02-27 NOTE — Assessment & Plan Note (Signed)
Uncontrolled, pt to commit t odaily medication

## 2018-02-27 NOTE — Assessment & Plan Note (Signed)
Deteriorated. Patient re-educated about  the importance of commitment to a  minimum of 150 minutes of exercise per week.  The importance of healthy food choices with portion control discussed. Encouraged to start a food diary, count calories and to consider  joining a support group. Sample diet sheets offered. Goals set by the patient for the next several months.   Weight /BMI 02/24/2018 08/16/2017 02/25/2017  WEIGHT 183 lb 185 lb 1.3 oz 180 lb  HEIGHT 5\' 4"  5\' 6"  5\' 6"   BMI 31.41 kg/m2 29.87 kg/m2 29.05 kg/m2

## 2018-02-27 NOTE — Assessment & Plan Note (Signed)
Hyperlipidemia:Low fat diet discussed and encouraged.   Lipid Panel  Lab Results  Component Value Date   CHOL 167 02/18/2018   HDL 51 02/18/2018   LDLCALC 97 02/18/2018   TRIG 91 02/18/2018   CHOLHDL 3.3 02/18/2018  Controlled, no change in medication

## 2018-02-27 NOTE — Progress Notes (Signed)
   Kristin Blackwell     MRN: [id number]      DOB: 1950/08/25   HPI [name] is here for follow up and re-evaluation of chronic medical conditions, medication management and review of any available recent lab and radiology data.  Preventive health is updated, specifically  Cancer screening and Immunization.   Reports improvement in respiratory symptoms with meds prescribed, though still has chronic coiugh. The PT denies any adverse reactions to current medications since the last visit.  C/o poor relations with her spouse after the first 3 years of marriage, now that he is sick, she is incapable of really being there for him and has come to terms with this, esp as they have lived separate lives for over 20 years in the same home  ROS Denies recent fever or chills. Denies sinus pressure, nasal congestion, ear pain or sore throat. Denies chest congestion, productive cough or wheezing. Denies chest pains, palpitations and leg swelling Denies abdominal pain, nausea, vomiting,diarrhea or constipation.   Denies dysuria, frequency, hesitancy or incontinence. Denies joint pain, swelling and limitation in mobility. Denies headaches, seizures, numbness, or tingling.  Denies skin break down or rash.   PE  BP 110/70   Pulse 70   Resp 12   Ht 5\' 4"  (1.626 m)   Wt 183 lb (83 kg)   SpO2 97% Comment: room air  BMI 31.41 kg/m   Patient alert and oriented and in no cardiopulmonary distress.  HEENT: No facial asymmetry, EOMI,   oropharynx pink and moist.  Neck supple no JVD, no mass.  Chest: Clear to auscultation bilaterally.  CVS: S1, S2 no murmurs, no S3.Regular rate.  ABD: Soft non tender.   Ext: No edema  MS: Adequate ROM spine, shoulders, hips and knees.  Skin: Intact, no ulcerations or rash noted.  Psych: Good eye contact, normal affect. Memory intact not anxious or depressed appearing.  CNS: CN 2-12 intact, power,  normal throughout.no focal deficits noted.   Assessment  & Plan  Hyperlipidemia LDL goal <100 Hyperlipidemia:Low fat diet discussed and encouraged.   Lipid Panel  Lab Results  Component Value Date   CHOL 167 02/18/2018   HDL 51 02/18/2018   LDLCALC 97 02/18/2018   TRIG 91 02/18/2018   CHOLHDL 3.3 02/18/2018  Controlled, no change in medication      Overweight (BMI 25.0-29.9) Deteriorated. Patient re-educated about  the importance of commitment to a  minimum of 150 minutes of exercise per week.  The importance of healthy food choices with portion control discussed. Encouraged to start a food diary, count calories and to consider  joining a support group. Sample diet sheets offered. Goals set by the patient for the next several months.   Weight /BMI 02/24/2018 08/16/2017 02/25/2017  WEIGHT 183 lb 185 lb 1.3 oz 180 lb  HEIGHT 5\' 4"  5\' 6"  5\' 6"   BMI 31.41 kg/m2 29.87 kg/m2 29.05 kg/m2      Allergic rhinitis Uncontrolled, pt to commit t odaily medication

## 2018-07-31 ENCOUNTER — Other Ambulatory Visit: Payer: Self-pay | Admitting: Family Medicine

## 2018-09-28 ENCOUNTER — Ambulatory Visit (INDEPENDENT_AMBULATORY_CARE_PROVIDER_SITE_OTHER): Payer: BC Managed Care – PPO | Admitting: Family Medicine

## 2018-09-28 ENCOUNTER — Other Ambulatory Visit: Payer: Self-pay

## 2018-09-28 ENCOUNTER — Encounter: Payer: Self-pay | Admitting: Family Medicine

## 2018-09-28 VITALS — BP 120/60 | HR 75 | Resp 12 | Ht 64.0 in | Wt 181.1 lb

## 2018-09-28 DIAGNOSIS — E785 Hyperlipidemia, unspecified: Secondary | ICD-10-CM | POA: Diagnosis not present

## 2018-09-28 DIAGNOSIS — J3089 Other allergic rhinitis: Secondary | ICD-10-CM

## 2018-09-28 DIAGNOSIS — E8881 Metabolic syndrome: Secondary | ICD-10-CM | POA: Diagnosis not present

## 2018-09-28 DIAGNOSIS — R7303 Prediabetes: Secondary | ICD-10-CM | POA: Diagnosis not present

## 2018-09-28 DIAGNOSIS — E663 Overweight: Secondary | ICD-10-CM

## 2018-09-28 DIAGNOSIS — E559 Vitamin D deficiency, unspecified: Secondary | ICD-10-CM | POA: Diagnosis not present

## 2018-09-28 MED ORDER — PROMETHAZINE-DM 6.25-15 MG/5ML PO SYRP: ORAL_SOLUTION | ORAL | 0 refills | Status: DC

## 2018-09-28 NOTE — Progress Notes (Signed)
Kristin Blackwell     MRN: [id number]      DOB: 1950/07/26   HPI [name] is here for follow up and re-evaluation of chronic medical conditions, medication management and review of any available recent lab and radiology data.  Preventive health is updated, specifically  Cancer screening and Immunization.   Questions or concerns regarding consultations or procedures which the PT has had in the interim are  addressed. The PT denies any adverse reactions to current medications since the last visit.  Right knee pain intermittent after moving furniture Chest pain last week after moving furniture in upper body ROS Denies recent fever or chills. Denies sinus pressure, nasal congestion, ear pain or sore throat. Denies chest congestion, productive cough or wheezing. Denies chest pains, palpitations and leg swelling Denies abdominal pain, nausea, vomiting,diarrhea or constipation.   Denies dysuria, frequency, hesitancy or incontinence. Denies joint pain, swelling and limitation in mobility. Denies headaches, seizures, numbness, or tingling. Denies depression, anxiety or insomnia. Denies skin break down or rash.   PE  BP 120/60   Pulse 75   Resp 12   Ht 5\' 4"  (1.626 m)   Wt 181 lb 1.3 oz (82.1 kg)   SpO2 96%   BMI 31.08 kg/m   Patient alert and oriented and in no cardiopulmonary distress.  HEENT: No facial asymmetry, EOMI,   oropharynx pink and moist.  Neck supple no JVD, no mass.  Chest: Clear to auscultation bilaterally.  CVS: S1, S2 no murmurs, no S3.Regular rate.  ABD: Soft non tender.   Ext: No edema  MS: Adequate ROM spine, shoulders, hips and knees.  Skin: Intact, no ulcerations or rash noted.  Psych: Good eye contact, normal affect. Memory intact not anxious or depressed appearing.  CNS: CN 2-12 intact, power,  normal throughout.no focal deficits noted.   Assessment & Plan  Hyperlipidemia LDL goal <100 Hyperlipidemia:Low fat diet discussed and encouraged.    Lipid Panel  Lab Results  Component Value Date   CHOL 167 02/18/2018   HDL 51 02/18/2018   LDLCALC 97 02/18/2018   TRIG 91 02/18/2018   CHOLHDL 3.3 02/18/2018     Controlled , updated lab needed  Allergic rhinitis Controlled, no change in medication   Overweight (BMI 25.0-29.9)  Patient re-educated about  the importance of commitment to a  minimum of 150 minutes of exercise per week as able.  The importance of healthy food choices with portion control discussed, as well as eating regularly and within a 12 hour window most days. The need to choose "clean , green" food 50 to 75% of the time is discussed, as well as to make water the primary drink and set a goal of 64 ounces water daily.    Weight /BMI 09/28/2018 02/24/2018 08/16/2017  WEIGHT 181 lb 1.3 oz 183 lb 185 lb 1.3 oz  HEIGHT 5\' 4"  5\' 4"  5\' 6"   BMI 31.08 kg/m2 31.41 kg/m2 29.87 kg/m2      Metabolic syndrome X The increased risk of cardiovascular disease associated with this diagnosis, and the need to consistently work on lifestyle to change this is discussed. Following  a  heart healthy diet ,commitment to 30 minutes of exercise at least 5 days per week, as well as control of blood sugar and cholesterol , and achieving a healthy weight are all the areas to be addressed .   Prediabetes Patient educated about the importance of limiting  Carbohydrate intake , the need to commit to daily physical activity  for a minimum of 30 minutes , and to commit weight loss. The fact that changes in all these areas will reduce or eliminate all together the development of diabetes is stressed.  Updated lab needed at/ before next visit.   Diabetic Labs Latest Ref Rng & Units 02/18/2018 08/27/2017 02/25/2017 08/25/2016 02/06/2016  HbA1c <5.7 % of total Hgb - - 6.1(H) 6.1(H) 6.1(H)  Chol <200 mg/dL 167 - 176 181 120  HDL >50 mg/dL 51 - 67 53 42(L)  Calc LDL mg/dL (calc) 97 - 90 109(H) 62  Triglycerides <150 mg/dL 91 - 96 94 81   Creatinine 0.50 - 0.99 mg/dL - 0.87 0.80 - 0.92   BP/Weight 09/28/2018 02/24/2018 08/16/2017 02/25/2017 01/19/2017 12/14/2016 2/92/9090  Systolic BP 301 499 692 493 241 991 444  Diastolic BP 60 70 70 78 78 87 82  Wt. (Lbs) 181.08 183 185.08 180 182 - 175  BMI 31.08 31.41 29.87 29.05 29.38 - 31   No flowsheet data found.

## 2018-09-28 NOTE — Patient Instructions (Addendum)
F/U in 6 months, call if you need me sooner  Please assist pt at check`out with password re set  Fasting CBc,lipid, cmp and EGR,HBA1C,TSH and vit D  Medications are sent and as are discussed  Thanks for choosing [facility], we consider it a privelige to serve you.   It is important that you exercise regularly at least 30 minutes 5 times a week. If you develop chest pain, have severe difficulty breathing, or feel very tired, stop exercising immediately and seek medical attention  Think about what you will eat, plan ahead. Choose " clean, green, fresh or frozen" over canned, processed or packaged foods which are more sugary, salty and fatty. 70 to 75% of food eaten should be vegetables and fruit. Three meals at set times with snacks allowed between meals, but they must be fruit or vegetables. Aim to eat over a 12 hour period , example 7 am to 7 pm, and STOP after  your last meal of the day. Drink water,generally about 64 ounces per day, no other drink is as healthy. Fruit juice is best enjoyed in a healthy way, by EATING the fruit.

## 2018-10-01 ENCOUNTER — Encounter: Payer: Self-pay | Admitting: Family Medicine

## 2018-10-01 NOTE — Assessment & Plan Note (Signed)
  Patient re-educated about  the importance of commitment to a  minimum of 150 minutes of exercise per week as able.  The importance of healthy food choices with portion control discussed, as well as eating regularly and within a 12 hour window most days. The need to choose "clean , green" food 50 to 75% of the time is discussed, as well as to make water the primary drink and set a goal of 64 ounces water daily.    Weight /BMI 09/28/2018 02/24/2018 08/16/2017  WEIGHT 181 lb 1.3 oz 183 lb 185 lb 1.3 oz  HEIGHT 5\' 4"  5\' 4"  5\' 6"   BMI 31.08 kg/m2 31.41 kg/m2 29.87 kg/m2

## 2018-10-01 NOTE — Assessment & Plan Note (Signed)
Hyperlipidemia:Low fat diet discussed and encouraged.   Lipid Panel  Lab Results  Component Value Date   CHOL 167 02/18/2018   HDL 51 02/18/2018   LDLCALC 97 02/18/2018   TRIG 91 02/18/2018   CHOLHDL 3.3 02/18/2018     Controlled , updated lab needed

## 2018-10-01 NOTE — Assessment & Plan Note (Signed)
Controlled, no change in medication  

## 2018-10-01 NOTE — Assessment & Plan Note (Signed)
The increased risk of cardiovascular disease associated with this diagnosis, and the need to consistently work on lifestyle to change this is discussed. Following  a  heart healthy diet ,commitment to 30 minutes of exercise at least 5 days per week, as well as control of blood sugar and cholesterol , and achieving a healthy weight are all the areas to be addressed .  

## 2018-10-01 NOTE — Assessment & Plan Note (Signed)
Patient educated about the importance of limiting  Carbohydrate intake , the need to commit to daily physical activity for a minimum of 30 minutes , and to commit weight loss. The fact that changes in all these areas will reduce or eliminate all together the development of diabetes is stressed.  Updated lab needed at/ before next visit.   Diabetic Labs Latest Ref Rng & Units 02/18/2018 08/27/2017 02/25/2017 08/25/2016 02/06/2016  HbA1c <5.7 % of total Hgb - - 6.1(H) 6.1(H) 6.1(H)  Chol <200 mg/dL 167 - 176 181 120  HDL >50 mg/dL 51 - 67 53 42(L)  Calc LDL mg/dL (calc) 97 - 90 109(H) 62  Triglycerides <150 mg/dL 91 - 96 94 81  Creatinine 0.50 - 0.99 mg/dL - 0.87 0.80 - 0.92   BP/Weight 09/28/2018 02/24/2018 08/16/2017 02/25/2017 01/19/2017 12/14/2016 1/74/0814  Systolic BP 481 856 314 970 263 785 885  Diastolic BP 60 70 70 78 78 87 82  Wt. (Lbs) 181.08 183 185.08 180 182 - 175  BMI 31.08 31.41 29.87 29.05 29.38 - 31   No flowsheet data found.

## 2018-10-05 LAB — COMPLETE METABOLIC PANEL WITH GFR
AG Ratio: 1.4 (calc) (ref 1.0–2.5)
ALT: 15 U/L (ref 6–29)
AST: 20 U/L (ref 10–35)
Albumin: 4 g/dL (ref 3.6–5.1)
Alkaline phosphatase (APISO): 70 U/L (ref 37–153)
BUN: 13 mg/dL (ref 7–25)
CO2: 25 mmol/L (ref 20–32)
Calcium: 9.3 mg/dL (ref 8.6–10.4)
Chloride: 108 mmol/L (ref 98–110)
Creat: 0.83 mg/dL (ref 0.50–0.99)
GFR, Est African American: 85 mL/min/{1.73_m2} (ref >=60)
GFR, Est Non African American: 73 mL/min/{1.73_m2} (ref >=60)
Globulin: 2.8 g/dL (calc) (ref 1.9–3.7)
Glucose, Bld: 97 mg/dL (ref 65–99)
Potassium: 4.2 mmol/L (ref 3.5–5.3)
Sodium: 139 mmol/L (ref 135–146)
Total Bilirubin: 0.2 mg/dL (ref 0.2–1.2)
Total Protein: 6.8 g/dL (ref 6.1–8.1)

## 2018-10-05 LAB — CBC
HCT: 37.2 % (ref 35.0–45.0)
Hemoglobin: 12.2 g/dL (ref 11.7–15.5)
MCH: 27.4 pg (ref 27.0–33.0)
MCHC: 32.8 g/dL (ref 32.0–36.0)
MCV: 83.6 fL (ref 80.0–100.0)
MPV: 11.1 fL (ref 7.5–12.5)
Platelets: 296 10*3/uL (ref 140–400)
RBC: 4.45 10*6/uL (ref 3.80–5.10)
RDW: 14.9 % (ref 11.0–15.0)
WBC: 6.5 10*3/uL (ref 3.8–10.8)

## 2018-10-05 LAB — LIPID PANEL
Cholesterol: 208 mg/dL — ABNORMAL HIGH (ref <200)
HDL: 47 mg/dL — ABNORMAL LOW (ref >=50)
LDL Cholesterol (Calc): 135 mg/dL (calc) — ABNORMAL HIGH
Non-HDL Cholesterol (Calc): 161 mg/dL (calc) — ABNORMAL HIGH (ref <130)
Total CHOL/HDL Ratio: 4.4 (calc) (ref <5.0)
Triglycerides: 136 mg/dL (ref <150)

## 2018-10-05 LAB — HEMOGLOBIN A1C
Hgb A1c MFr Bld: 6.2 % of total Hgb — ABNORMAL HIGH (ref <5.7)
Mean Plasma Glucose: 131 (calc)
eAG (mmol/L): 7.3 (calc)

## 2018-10-05 LAB — VITAMIN D 25 HYDROXY (VIT D DEFICIENCY, FRACTURES): Vit D, 25-Hydroxy: 37 ng/mL (ref 30–100)

## 2018-10-05 LAB — TSH: TSH: 1.79 mIU/L (ref 0.40–4.50)

## 2018-10-07 ENCOUNTER — Encounter: Payer: Self-pay | Admitting: Family Medicine

## 2018-11-08 ENCOUNTER — Other Ambulatory Visit: Payer: Self-pay

## 2018-11-08 ENCOUNTER — Ambulatory Visit (INDEPENDENT_AMBULATORY_CARE_PROVIDER_SITE_OTHER): Payer: BC Managed Care – PPO

## 2018-11-08 DIAGNOSIS — Z23 Encounter for immunization: Secondary | ICD-10-CM

## 2019-03-23 ENCOUNTER — Other Ambulatory Visit: Payer: Self-pay

## 2019-03-23 ENCOUNTER — Ambulatory Visit (INDEPENDENT_AMBULATORY_CARE_PROVIDER_SITE_OTHER): Payer: BC Managed Care – PPO | Admitting: Family Medicine

## 2019-03-23 VITALS — BP 120/60 | Ht 64.0 in | Wt 172.0 lb

## 2019-03-23 DIAGNOSIS — E785 Hyperlipidemia, unspecified: Secondary | ICD-10-CM

## 2019-03-23 DIAGNOSIS — Z1211 Encounter for screening for malignant neoplasm of colon: Secondary | ICD-10-CM

## 2019-03-23 DIAGNOSIS — K219 Gastro-esophageal reflux disease without esophagitis: Secondary | ICD-10-CM

## 2019-03-23 DIAGNOSIS — E663 Overweight: Secondary | ICD-10-CM

## 2019-03-23 DIAGNOSIS — J3089 Other allergic rhinitis: Secondary | ICD-10-CM

## 2019-03-23 DIAGNOSIS — R7303 Prediabetes: Secondary | ICD-10-CM | POA: Diagnosis not present

## 2019-03-23 MED ORDER — FLUTICASONE PROPIONATE 50 MCG/ACT NA SUSP: 2.0000 | Freq: Every day | NASAL | 6 refills | Status: DC

## 2019-03-23 NOTE — Patient Instructions (Addendum)
F/U in office with MD , re evaluate chronic problems, and weight  Please call pt  For GI for colonoscopy, today if able  Please get fasting lipid, cmp and eGFR  And HBA1C in the next 1 week ( [facility])  It is important that you exercise regularly at least 30 minutes 5 times a week. If you develop chest pain, have severe difficulty breathing, or feel very tired, stop exercising immediately and seek medical attention    Think about what you will eat, plan ahead. Choose " clean, green, fresh or frozen" over canned, processed or packaged foods which are more sugary, salty and fatty. 70 to 75% of food eaten should be vegetables and fruit. Three meals at set times with snacks allowed between meals, but they must be fruit or vegetables. Aim to eat over a 12 hour period , example 7 am to 7 pm, and STOP after  your last meal of the day. Drink water,generally about 64 ounces per day, no other drink is as healthy. Fruit juice is best enjoyed in a healthy way, by EATING the fruit.  It is important that you exercise regularly at least 30 minutes 5 times a week. If you develop chest pain, have severe difficulty breathing, or feel very tired, stop exercising immediately and seek medical attention    Thanks for choosing [facility], we consider it a privelige to serve you.    

## 2019-03-23 NOTE — Progress Notes (Signed)
Virtual Visit via Telephone Note  I connected with Kristin Blackwell on 03/23/19 at 10:00 AM EST by telephone and verified that I am speaking with the correct person using two identifiers.  Location: Patient: : home Provider: office   I discussed the limitations, risks, security and privacy concerns of performing an evaluation and management service by telephone and the availability of in person appointments. I also discussed with the patient that there may be a patient responsible charge related to this service. The patient expressed understanding and agreed to proceed.   History of Present Illness: F/U chronic problems, medication review, and refill medication when necessary. Review most recent labs and order labs which are due Review preventive health and update with necessary referrals or immunizations as indicated Reports that she is managing fairly well since unexpected death of her spouse, she does have good support from family in particular, ad also friends Denies recent fever or chills. Denies sinus pressure, nasal congestion, ear pain or sore throat.allergies currently controlled on medication Plans to iuncrease exercise, is trying with her diet Denies chest congestion, productive cough or wheezing. Denies chest pains, palpitations and leg swelling Denies abdominal pain, nausea, vomiting,diarrhea or constipation.   Denies dysuria, frequency, hesitancy or incontinence. Denies joint pain, swelling and limitation in mobility. Denies headaches, seizures, numbness, or tingling. Denies depression, anxiety or insomnia.states has a few bad days when she cries, misses presence of her spouse Denies skin break down or rash.       Observations/Objective: BP 120/60   Ht 5\' 4"  (1.626 m)   Wt 172 lb (78 kg)   BMI 29.52 kg/m  Good communication with no confusion and intact memory. Alert and oriented x 3 No signs of respiratory distress during speech    Assessment and  Plan:  Hyperlipidemia LDL goal <100 Hyperlipidemia:Low fat diet discussed and encouraged.   Lipid Panel  Lab Results  Component Value Date   CHOL 208 (H) 10/04/2018   HDL 47 (L) 10/04/2018   LDLCALC 135 (H) 10/04/2018   TRIG 136 10/04/2018   CHOLHDL 4.4 10/04/2018   Updated lab needed    Allergic rhinitis Controlled, no change in medication   Overweight (BMI 25.0-29.9)  Patient re-educated about  the importance of commitment to a  minimum of 150 minutes of exercise per week as able.  The importance of healthy food choices with portion control discussed, as well as eating regularly and within a 12 hour window most days. The need to choose "clean , green" food 50 to 75% of the time is discussed, as well as to make water the primary drink and set a goal of 64 ounces water daily.    Weight /BMI 03/23/2019 09/28/2018 02/24/2018  WEIGHT 172 lb 181 lb 1.3 oz 183 lb  HEIGHT 5\' 4"  5\' 4"  5\' 4"   BMI 29.52 kg/m2 31.08 kg/m2 31.41 kg/m2      Prediabetes Patient educated about the importance of limiting  Carbohydrate intake , the need to commit to daily physical activity for a minimum of 30 minutes , and to commit weight loss. The fact that changes in all these areas will reduce or eliminate all together the development of diabetes is stressed.  Updated lab needed.   Diabetic Labs Latest Ref Rng & Units 10/04/2018 02/18/2018 08/27/2017 02/25/2017 08/25/2016  HbA1c <5.7 % of total Hgb 6.2(H) - - 6.1(H) 6.1(H)  Chol <200 mg/dL 208(H) 167 - 176 181  HDL > OR = 50 mg/dL 47(L) 51 - 67 53  Calc LDL mg/dL (calc) 135(H) 97 - 90 109(H)  Triglycerides <150 mg/dL 136 91 - 96 94  Creatinine 0.50 - 0.99 mg/dL 0.83 - 0.87 0.80 -   BP/Weight 03/23/2019 09/28/2018 02/24/2018 08/16/2017 02/25/2017 01/19/2017 [id number]  Systolic BP 123456 123456 [id number] A999333 123456 [id number]  Diastolic BP 60 60 70 70 78 78 87  Wt. (Lbs) 172 181.08 183 185.08 180 182 -  BMI 29.52 31.08 31.41 29.87 29.05 29.38 -   No flowsheet data  found.    GERD (gastroesophageal reflux disease) Currently controlled by lifestyle only   Follow Up Instructions:    I discussed the assessment and treatment plan with the patient. The patient was provided an opportunity to ask questions and all were answered. The patient agreed with the plan and demonstrated an understanding of the instructions.   The patient was advised to call back or seek an in-person evaluation if the symptoms worsen or if the condition fails to improve as anticipated.  I provided 20 minutes of non-face-to-face time during this encounter.   Tula Nakayama, MD

## 2019-03-27 ENCOUNTER — Encounter: Payer: Self-pay | Admitting: Family Medicine

## 2019-03-27 NOTE — Assessment & Plan Note (Signed)
Patient educated about the importance of limiting  Carbohydrate intake , the need to commit to daily physical activity for a minimum of 30 minutes , and to commit weight loss. The fact that changes in all these areas will reduce or eliminate all together the development of diabetes is stressed.  Updated lab needed.   Diabetic Labs Latest Ref Rng & Units 10/04/2018 02/18/2018 08/27/2017 02/25/2017 08/25/2016  HbA1c <5.7 % of total Hgb 6.2(H) - - 6.1(H) 6.1(H)  Chol <200 mg/dL 208(H) 167 - 176 181  HDL > OR = 50 mg/dL 47(L) 51 - 67 53  Calc LDL mg/dL (calc) 135(H) 97 - 90 109(H)  Triglycerides <150 mg/dL 136 91 - 96 94  Creatinine 0.50 - 0.99 mg/dL 0.83 - 0.87 0.80 -   BP/Weight 03/23/2019 09/28/2018 02/24/2018 08/16/2017 02/25/2017 01/19/2017 [id number]  Systolic BP 123456 123456 A999333 A999333 123456 [id number]  Diastolic BP 60 60 70 70 78 78 87  Wt. (Lbs) 172 181.08 183 185.08 180 182 -  BMI 29.52 31.08 31.41 29.87 29.05 29.38 -   No flowsheet data found.

## 2019-03-27 NOTE — Assessment & Plan Note (Signed)
Hyperlipidemia:Low fat diet discussed and encouraged.   Lipid Panel  Lab Results  Component Value Date   CHOL 208 (H) 10/04/2018   HDL 47 (L) 10/04/2018   LDLCALC 135 (H) 10/04/2018   TRIG 136 10/04/2018   CHOLHDL 4.4 10/04/2018   Updated lab needed

## 2019-03-27 NOTE — Assessment & Plan Note (Signed)
  Patient re-educated about  the importance of commitment to a  minimum of 150 minutes of exercise per week as able.  The importance of healthy food choices with portion control discussed, as well as eating regularly and within a 12 hour window most days. The need to choose "clean , green" food 50 to 75% of the time is discussed, as well as to make water the primary drink and set a goal of 64 ounces water daily.    Weight /BMI 03/23/2019 09/28/2018 02/24/2018  WEIGHT 172 lb 181 lb 1.3 oz 183 lb  HEIGHT 5\' 4"  5\' 4"  5\' 4"   BMI 29.52 kg/m2 31.08 kg/m2 31.41 kg/m2

## 2019-03-27 NOTE — Assessment & Plan Note (Signed)
Controlled, no change in medication  

## 2019-03-27 NOTE — Assessment & Plan Note (Signed)
Currently controlled by lifestyle only

## 2019-03-30 ENCOUNTER — Ambulatory Visit: Payer: BC Managed Care – PPO | Admitting: Family Medicine

## 2019-03-31 LAB — COMPLETE METABOLIC PANEL WITH GFR
AG Ratio: 1.4 (calc) (ref 1.0–2.5)
ALT: 11 U/L (ref 6–29)
AST: 16 U/L (ref 10–35)
Albumin: 4.1 g/dL (ref 3.6–5.1)
Alkaline phosphatase (APISO): 71 U/L (ref 37–153)
BUN: 15 mg/dL (ref 7–25)
CO2: 26 mmol/L (ref 20–32)
Calcium: 9.2 mg/dL (ref 8.6–10.4)
Chloride: 107 mmol/L (ref 98–110)
Creat: 0.82 mg/dL (ref 0.50–0.99)
GFR, Est African American: 85 mL/min/{1.73_m2} (ref >=60)
GFR, Est Non African American: 74 mL/min/{1.73_m2} (ref >=60)
Globulin: 2.9 g/dL (calc) (ref 1.9–3.7)
Glucose, Bld: 95 mg/dL (ref 65–99)
Potassium: 4.4 mmol/L (ref 3.5–5.3)
Sodium: 140 mmol/L (ref 135–146)
Total Bilirubin: 0.4 mg/dL (ref 0.2–1.2)
Total Protein: 7 g/dL (ref 6.1–8.1)

## 2019-03-31 LAB — HEMOGLOBIN A1C
Hgb A1c MFr Bld: 6.1 % of total Hgb — ABNORMAL HIGH (ref <5.7)
Mean Plasma Glucose: 128 (calc)
eAG (mmol/L): 7.1 (calc)

## 2019-03-31 LAB — LIPID PANEL
Cholesterol: 166 mg/dL (ref <200)
HDL: 49 mg/dL — ABNORMAL LOW (ref >=50)
LDL Cholesterol (Calc): 98 mg/dL (calc)
Non-HDL Cholesterol (Calc): 117 mg/dL (calc) (ref <130)
Total CHOL/HDL Ratio: 3.4 (calc) (ref <5.0)
Triglycerides: 98 mg/dL (ref <150)

## 2019-04-03 ENCOUNTER — Encounter: Payer: Self-pay | Admitting: Family Medicine

## 2019-04-27 ENCOUNTER — Ambulatory Visit: Payer: BC Managed Care – PPO | Attending: Family

## 2019-04-27 DIAGNOSIS — Z23 Encounter for immunization: Secondary | ICD-10-CM

## 2019-04-27 NOTE — Progress Notes (Signed)
   Covid-19 Vaccination Clinic  Name:  Kristin Blackwell    MRN: [id number] DOB: 29-Jan-1951  04/27/2019  [name] was observed post Covid-19 immunization for 15 minutes without incident. She was provided with Vaccine Information Sheet and instruction to access the V-Safe system.   [name] was instructed to call 911 with any severe reactions post vaccine: Marland Kitchen Difficulty breathing  . Swelling of face and throat  . A fast heartbeat  . A bad rash all over body  . Dizziness and weakness   Immunizations Administered    Name Date Dose VIS Date Route   Moderna COVID-19 Vaccine 04/27/2019  2:43 PM 0.5 mL 01/17/2019 Intramuscular   Manufacturer: Moderna   Lot: [id number]   CrandonBE:[id number]

## 2019-05-08 ENCOUNTER — Telehealth: Payer: Self-pay | Admitting: *Deleted

## 2019-05-08 NOTE — Telephone Encounter (Signed)
Error

## 2019-05-09 ENCOUNTER — Encounter: Payer: Self-pay | Admitting: Family Medicine

## 2019-05-09 ENCOUNTER — Other Ambulatory Visit: Payer: Self-pay

## 2019-05-09 ENCOUNTER — Ambulatory Visit: Payer: BC Managed Care – PPO | Admitting: Family Medicine

## 2019-05-09 VITALS — BP 130/67 | HR 71 | Temp 97.9°F | Ht 64.0 in | Wt 178.6 lb

## 2019-05-09 DIAGNOSIS — L239 Allergic contact dermatitis, unspecified cause: Secondary | ICD-10-CM | POA: Diagnosis not present

## 2019-05-09 MED ORDER — HYDROXYZINE HCL 10 MG PO TABS: ORAL_TABLET | ORAL | 0 refills | Status: DC

## 2019-05-09 MED ORDER — PREDNISONE 10 MG PO TABS: ORAL_TABLET | ORAL | 0 refills | Status: DC

## 2019-05-09 NOTE — Progress Notes (Signed)
Established Patient Office Visit  Subjective:  Patient ID: Kristin Blackwell, female    DOB: [birth year]  Age: 69 y.o. MRN: [id number]  CC:  Chief Complaint  Patient presents with  . Rash    right arm swelling and rash post covid shot on 04/27/19. Rash appeared on 05/02/19 after shot. Some spots started to pop up on left as well    HPI Kristin Blackwell presents for rash noted after COVID vaccine. Pt states arm sore on day after COVID shot 3/12. Pt with no redness. Pt states she noted rash on the right wrist and lower arm-started on 3/16. Pt worked in the yard over the previous weekend. Pt states rash itchy and red-worse at night. Pt with new onset rash over the last few day on the left wrist and lower arm.  Vesicles and papules noted with eryth associated.pt using topical steroid cream and taking benadryl.   Past Medical History:  Diagnosis Date  . Hyperlipidemia   . Nicotine addiction   . Overweight(278.02)     Past Surgical History:  Procedure Laterality Date  . PARTIAL HYSTERECTOMY  1987   had one ovary removed     Family History  Problem Relation Age of Onset  . Hypertension Mother   . Emphysema Father   . Hypertension Sister   . Diabetes Brother        oldest     Social History   Socioeconomic History  . Marital status: Married    Spouse name: Not on file  . Number of children: Not on file  . Years of education: Not on file  . Highest education level: Not on file  Occupational History  . Occupation: Licensed conveyancer at [employer]  . Smoking status: Former Smoker    Packs/day: 1.00    Types: Cigarettes  . Smokeless tobacco: Never Used  . Tobacco comment: 1 week ago   Substance and Sexual Activity  . Alcohol use: Yes    Comment: occassionally  . Drug use: No  . Sexual activity: Not on file  Other Topics Concern  . Not on file  Social History Narrative   Cell [phone]   Social Determinants of Health   Financial Resource Strain:   .  Difficulty of Paying Living Expenses:   Food Insecurity:   . Worried About Charity fundraiser in the Last Year:   . Arboriculturist in the Last Year:   Transportation Needs:   . Film/video editor (Medical):   Marland Kitchen Lack of Transportation (Non-Medical):   Physical Activity:   . Days of Exercise per Week:   . Minutes of Exercise per Session:   Stress:   . Feeling of Stress :   Social Connections:   . Frequency of Communication with Friends and Family:   . Frequency of Social Gatherings with Friends and Family:   . Attends Religious Services:   . Active Member of Clubs or Organizations:   . Attends Archivist Meetings:   Marland Kitchen Marital Status:   Intimate Partner Violence:   . Fear of Current or Ex-Partner:   . Emotionally Abused:   Marland Kitchen Physically Abused:   . Sexually Abused:     Outpatient Medications Prior to Visit  Medication Sig Dispense Refill  . fluticasone (FLONASE) 50 MCG/ACT nasal spray Place 2 sprays into both nostrils daily. 16 g 6  . montelukast (SINGULAIR) 10 MG tablet Take 1 tablet (10 mg total) by mouth at bedtime.  90 tablet 3  . rosuvastatin (CRESTOR) 10 MG tablet TAKE ONE TABLET EVERY EVENING ON MONDAY, WEDNESDAY AND FRIDAY 90 tablet 1   No facility-administered medications prior to visit.    Allergies  Allergen Reactions  . Ibandronate Sodium   . Lipitor [Atorvastatin]     Muscle aches  . Pravastatin     Muscle aches    ROS Review of Systems  Respiratory: Negative for shortness of breath.   Skin: Positive for rash.      Objective:    Physical Exam  Cardiovascular: Normal rate and regular rhythm.  Pulmonary/Chest: Effort normal.  Skin: Rash noted. There is erythema.     Linear papular rash with circular macules on the right wrist . Papular rash on the left wrist     BP 130/67 (BP Location: Left Arm, Patient Position: Sitting, Cuff Size: Normal)   Pulse 71   Temp 97.9 F (36.6 C) (Temporal)   Ht 5\' 4"  (1.626 m)   Wt 178 lb 9.6 oz (81  kg)   SpO2 98%   BMI 30.66 kg/m  Wt Readings from Last 3 Encounters:  05/09/19 178 lb 9.6 oz (81 kg)  03/23/19 172 lb (78 kg)  09/28/18 181 lb 1.3 oz (82.1 kg)    Health Maintenance Due  Topic Date Due  . COLONOSCOPY  07/26/2016    Lab Results  Component Value Date   TSH 1.79 10/04/2018   Lab Results  Component Value Date   WBC 6.5 10/04/2018   HGB 12.2 10/04/2018   HCT 37.2 10/04/2018   MCV 83.6 10/04/2018   PLT 296 10/04/2018   Lab Results  Component Value Date   NA 140 03/30/2019   K 4.4 03/30/2019   CO2 26 03/30/2019   GLUCOSE 95 03/30/2019   BUN 15 03/30/2019   CREATININE 0.82 03/30/2019   BILITOT 0.4 03/30/2019   ALKPHOS 76 08/25/2016   AST 16 03/30/2019   ALT 11 03/30/2019   PROT 7.0 03/30/2019   ALBUMIN 4.0 08/25/2016   CALCIUM 9.2 03/30/2019   Lab Results  Component Value Date   CHOL 166 03/30/2019   Lab Results  Component Value Date   HDL 49 (L) 03/30/2019   Lab Results  Component Value Date   LDLCALC 98 03/30/2019   Lab Results  Component Value Date   TRIG 98 03/30/2019   Lab Results  Component Value Date   CHOLHDL 3.4 03/30/2019   Lab Results  Component Value Date   HGBA1C 6.1 (H) 03/30/2019      Assessment & Plan:   1. Allergic contact dermatitis, unspecified trigger Prednisone-rx Vistaril-rx D/w pt topicals and avoidance from further contact Follow-up: prn   Rubens Cranston Hannah Beat, MD

## 2019-05-09 NOTE — Patient Instructions (Addendum)
Vistaril and prednisone-pick up at pharmacy  Contact Dermatitis Dermatitis is redness, soreness, and swelling (inflammation) of the skin. Contact dermatitis is a reaction to something that touches the skin. There are two types of contact dermatitis:  Irritant contact dermatitis. This happens when something bothers (irritates) your skin, like soap.  Allergic contact dermatitis. This is caused when you are exposed to something that you are allergic to, such as poison ivy. What are the causes?  Common causes of irritant contact dermatitis include: ? Makeup. ? Soaps. ? Detergents. ? Bleaches. ? Acids. ? Metals, such as nickel.  Common causes of allergic contact dermatitis include: ? Plants. ? Chemicals. ? Jewelry. ? Latex. ? Medicines. ? Preservatives in products, such as clothing. What increases the risk?  Having a job that exposes you to things that bother your skin.  Having asthma or eczema. What are the signs or symptoms? Symptoms may happen anywhere the irritant has touched your skin. Symptoms include:  Dry or flaky skin.  Redness.  Cracks.  Itching.  Pain or a burning feeling.  Blisters.  Blood or clear fluid draining from skin cracks. With allergic contact dermatitis, swelling may occur. This may happen in places such as the eyelids, mouth, or genitals. How is this treated?  This condition is treated by checking for the cause of the reaction and protecting your skin. Treatment may also include: ? Steroid creams, ointments, or medicines. ? Antibiotic medicines or other ointments, if you have a skin infection. ? Lotion or medicines to help with itching. ? A bandage (dressing). Follow these instructions at home: Skin care  Moisturize your skin as needed.  Put cool cloths on your skin.  Put a baking soda paste on your skin. Stir water into baking soda until it looks like a paste.  Do not scratch your skin.  Avoid having things rub up against your  skin.  Avoid the use of soaps, perfumes, and dyes. Medicines  Take or apply over-the-counter and prescription medicines only as told by your doctor.  If you were prescribed an antibiotic medicine, take or apply it as told by your doctor. Do not stop using it even if your condition starts to get better. Bathing  Take a bath with: ? Epsom salts. ? Baking soda. ? Colloidal oatmeal.  Bathe less often.  Bathe in warm water. Avoid using hot water. Bandage care  If you were given a bandage, change it as told by your health care provider.  Wash your hands with soap and water before and after you change your bandage. If soap and water are not available, use hand sanitizer. General instructions  Avoid the things that caused your reaction. If you do not know what caused it, keep a journal. Write down: ? What you eat. ? What skin products you use. ? What you drink. ? What you wear in the area that has symptoms. This includes jewelry.  Check the affected areas every day for signs of infection. Check for: ? More redness, swelling, or pain. ? More fluid or blood. ? Warmth. ? Pus or a bad smell.  Keep all follow-up visits as told by your doctor. This is important. Contact a doctor if:  You do not get better with treatment.  Your condition gets worse.  You have signs of infection, such as: ? More swelling. ? Tenderness. ? More redness. ? Soreness. ? Warmth.  You have a fever.  You have new symptoms. Get help right away if:  You have a very  bad headache.  You have neck pain.  Your neck is stiff.  You throw up (vomit).  You feel very sleepy.  You see red streaks coming from the area.  Your bone or joint near the area hurts after the skin has healed.  The area turns darker.  You have trouble breathing. Summary  Dermatitis is redness, soreness, and swelling of the skin.  Symptoms may occur where the irritant has touched you.  Treatment may include medicines  and skin care.  If you do not know what caused your reaction, keep a journal.  Contact a doctor if your condition gets worse or you have signs of infection. This information is not intended to replace advice given to you by your health care provider. Make sure you discuss any questions you have with your health care provider. Document Revised: 05/25/2018 Document Reviewed: 08/18/2017 Elsevier Patient Education  [employer].     If you have lab work done today you will be contacted with your lab results within the next 2 weeks.  If you have not heard from [place] then please contact us. The fastest way to get your results is to register for My Chart.   IF you received an x-ray today, you will receive an invoice from [facility]. Please contact [facility] at [phone] with questions or concerns regarding your invoice.   IF you received labwork today, you will receive an invoice from [facility]. Please contact [facility] at [phone] with questions or concerns regarding your invoice.   Our billing staff will not be able to assist you with questions regarding bills from these companies.  You will be contacted with the lab results as soon as they are available. The fastest way to get your results is to activate your My Chart account. Instructions are located on the last page of this paperwork. If you have not heard from [place] regarding the results in 2 weeks, please contact this office.

## 2019-05-30 ENCOUNTER — Ambulatory Visit: Payer: BC Managed Care – PPO | Attending: Family

## 2019-05-30 DIAGNOSIS — Z23 Encounter for immunization: Secondary | ICD-10-CM

## 2019-06-01 ENCOUNTER — Other Ambulatory Visit: Payer: Self-pay | Admitting: Family Medicine

## 2019-06-20 ENCOUNTER — Ambulatory Visit: Payer: BC Managed Care – PPO | Admitting: Family Medicine

## 2019-09-07 ENCOUNTER — Other Ambulatory Visit: Payer: Self-pay

## 2019-09-07 ENCOUNTER — Encounter: Payer: Self-pay | Admitting: Family Medicine

## 2019-09-07 ENCOUNTER — Ambulatory Visit (INDEPENDENT_AMBULATORY_CARE_PROVIDER_SITE_OTHER): Payer: BC Managed Care – PPO | Admitting: Family Medicine

## 2019-09-07 VITALS — BP 130/72 | HR 88 | Resp 16 | Ht 64.0 in | Wt 175.0 lb

## 2019-09-07 DIAGNOSIS — E8881 Metabolic syndrome: Secondary | ICD-10-CM

## 2019-09-07 DIAGNOSIS — R7303 Prediabetes: Secondary | ICD-10-CM | POA: Diagnosis not present

## 2019-09-07 DIAGNOSIS — E559 Vitamin D deficiency, unspecified: Secondary | ICD-10-CM | POA: Diagnosis not present

## 2019-09-07 DIAGNOSIS — J3089 Other allergic rhinitis: Secondary | ICD-10-CM

## 2019-09-07 DIAGNOSIS — E785 Hyperlipidemia, unspecified: Secondary | ICD-10-CM | POA: Diagnosis not present

## 2019-09-07 DIAGNOSIS — E663 Overweight: Secondary | ICD-10-CM

## 2019-09-07 NOTE — Patient Instructions (Addendum)
F/U in office with MD, in 5 month, call if you need me before  Fasting lipid, cbc, cmp and EGFr, TSH and vit D HBA1C  first week in September   It is important that you exercise regularly at least 30 minutes 5 times a week. If you develop chest pain, have severe difficulty breathing, or feel very tired, stop exercising immediately and seek medical attention   Think about what you will eat, plan ahead. Choose " clean, green, fresh or frozen" over canned, processed or packaged foods which are more sugary, salty and fatty. 70 to 75% of food eaten should be vegetables and fruit. Three meals at set times with snacks allowed between meals, but they must be fruit or vegetables. Aim to eat over a 12 hour period , example 7 am to 7 pm, and STOP after  your last meal of the day. Drink water,generally about 64 ounces per day, no other drink is as healthy. Fruit juice is best enjoyed in a healthy way, by EATING the fruit.  Thanks for choosing [facility] we consider it a privelige to serve you.

## 2019-09-07 NOTE — Progress Notes (Signed)
Kristin Blackwell     MRN: [id number]      DOB: 08-12-1950   HPI Kristin Blackwell is here for follow up and re-evaluation of chronic medical conditions, medication management and review of any available recent lab and radiology data.  Preventive health is updated, specifically  Cancer screening and Immunization.   Questions or concerns regarding consultations or procedures which the PT has had in the interim are  addressed. The PT denies any adverse reactions to current medications since the last visit.  There are no new concerns.  There are no specific complaints   ROS Denies recent fever or chills. Denies sinus pressure, nasal congestion, ear pain or sore throat. Denies chest congestion, productive cough or wheezing. Denies chest pains, palpitations and leg swelling Denies abdominal pain, nausea, vomiting,diarrhea or constipation.   Denies dysuria, frequency, hesitancy or incontinence. Denies joint pain, swelling and limitation in mobility. Denies headaches, seizures, numbness, or tingling. Denies depression, anxiety or insomnia. Denies skin break down or rash.   PE  BP (!) 130/72   Pulse 88   Resp 16   Ht 5\' 4"  (1.626 m)   Wt 175 lb (79.4 kg)   SpO2 96%   BMI 30.04 kg/m    Patient alert and oriented and in no cardiopulmonary distress.  HEENT: No facial asymmetry, EOMI,     Neck supple .  Chest: Clear to auscultation bilaterally.  CVS: S1, S2 no murmurs, no S3.Regular rate.  ABD: Soft non tender.   Ext: No edema  MS: Adequate ROM spine, shoulders, hips and knees.  Skin: Intact, no ulcerations or rash noted.  Psych: Good eye contact, normal affect. Memory intact not anxious or depressed appearing.  CNS: CN 2-12 intact, power,  normal throughout.no focal deficits noted.   Assessment & Plan  Hyperlipidemia LDL goal <100 Hyperlipidemia:Low fat diet discussed and encouraged.   Lipid Panel  Lab Results  Component Value Date   CHOL 166 03/30/2019   HDL 49  (L) 03/30/2019   LDLCALC 98 03/30/2019   TRIG 98 03/30/2019   CHOLHDL 3.4 03/30/2019   Needs to increase exercise, otherwise at goal, no med change Updated lab needed at/ before next visit.     Prediabetes Patient educated about the importance of limiting  Carbohydrate intake , the need to commit to daily physical activity for a minimum of 30 minutes , and to commit weight loss. The fact that changes in all these areas will reduce or eliminate all together the development of diabetes is stressed.   Diabetic Labs Latest Ref Rng & Units 03/30/2019 10/04/2018 02/18/2018 08/27/2017 02/25/2017  HbA1c <5.7 % of total Hgb 6.1(H) 6.2(H) - - 6.1(H)  Chol <200 mg/dL 166 208(H) 167 - 176  HDL > OR = 50 mg/dL 49(L) 47(L) 51 - 67  Calc LDL mg/dL (calc) 98 135(H) 97 - 90  Triglycerides <150 mg/dL 98 136 91 - 96  Creatinine 0.50 - 0.99 mg/dL 0.82 0.83 - 0.87 0.80   BP/Weight 09/07/2019 05/09/2019 03/23/2019 09/28/2018 02/24/2018 08/16/2017 5/42/7062  Systolic BP 376 283 151 761 607 371 062  Diastolic BP 72 67 60 60 70 70 78  Wt. (Lbs) 175 178.6 172 181.08 183 185.08 180  BMI 30.04 30.66 29.52 31.08 31.41 29.87 29.05   No flowsheet data found.    Allergic rhinitis Controlled, no change in medication   Overweight (BMI 25.0-29.9)  Patient re-educated about  the importance of commitment to a  minimum of 150 minutes of exercise per week  as able.  The importance of healthy food choices with portion control discussed, as well as eating regularly and within a 12 hour window most days. The need to choose "clean , green" food 50 to 75% of the time is discussed, as well as to make water the primary drink and set a goal of 64 ounces water daily.    Weight /BMI 09/07/2019 05/09/2019 03/23/2019  WEIGHT 175 lb 178 lb 9.6 oz 172 lb  HEIGHT 5\' 4"  5\' 4"  5\' 4"   BMI 30.04 kg/m2 30.66 kg/m2 29.52 kg/m2

## 2019-09-10 NOTE — Assessment & Plan Note (Signed)
  Patient re-educated about  the importance of commitment to a  minimum of 150 minutes of exercise per week as able.  The importance of healthy food choices with portion control discussed, as well as eating regularly and within a 12 hour window most days. The need to choose "clean , green" food 50 to 75% of the time is discussed, as well as to make water the primary drink and set a goal of 64 ounces water daily.    Weight /BMI 09/07/2019 05/09/2019 03/23/2019  WEIGHT 175 lb 178 lb 9.6 oz 172 lb  HEIGHT 5\' 4"  5\' 4"  5\' 4"   BMI 30.04 kg/m2 30.66 kg/m2 29.52 kg/m2

## 2019-09-10 NOTE — Assessment & Plan Note (Signed)
Patient educated about the importance of limiting  Carbohydrate intake , the need to commit to daily physical activity for a minimum of 30 minutes , and to commit weight loss. The fact that changes in all these areas will reduce or eliminate all together the development of diabetes is stressed.   Diabetic Labs Latest Ref Rng & Units 03/30/2019 10/04/2018 02/18/2018 08/27/2017 02/25/2017  HbA1c <5.7 % of total Hgb 6.1(H) 6.2(H) - - 6.1(H)  Chol <200 mg/dL 166 208(H) 167 - 176  HDL > OR = 50 mg/dL 49(L) 47(L) 51 - 67  Calc LDL mg/dL (calc) 98 135(H) 97 - 90  Triglycerides <150 mg/dL 98 136 91 - 96  Creatinine 0.50 - 0.99 mg/dL 0.82 0.83 - 0.87 0.80   BP/Weight 09/07/2019 05/09/2019 03/23/2019 09/28/2018 02/24/2018 08/16/2017 4/60/4799  Systolic BP 872 158 727 618 485 927 639  Diastolic BP 72 67 60 60 70 70 78  Wt. (Lbs) 175 178.6 172 181.08 183 185.08 180  BMI 30.04 30.66 29.52 31.08 31.41 29.87 29.05   No flowsheet data found.

## 2019-09-10 NOTE — Assessment & Plan Note (Signed)
Controlled, no change in medication  

## 2019-09-10 NOTE — Assessment & Plan Note (Signed)
Hyperlipidemia:Low fat diet discussed and encouraged.   Lipid Panel  Lab Results  Component Value Date   CHOL 166 03/30/2019   HDL 49 (L) 03/30/2019   LDLCALC 98 03/30/2019   TRIG 98 03/30/2019   CHOLHDL 3.4 03/30/2019   Needs to increase exercise, otherwise at goal, no med change Updated lab needed at/ before next visit.

## 2019-10-13 ENCOUNTER — Other Ambulatory Visit: Payer: Self-pay | Admitting: Family Medicine

## 2019-10-17 ENCOUNTER — Ambulatory Visit: Payer: BC Managed Care – PPO | Admitting: Family Medicine

## 2019-10-28 ENCOUNTER — Telehealth: Payer: Self-pay | Admitting: Family Medicine

## 2019-10-28 LAB — CMP14+EGFR
ALT: 30 IU/L (ref 0–32)
AST: 47 IU/L — ABNORMAL HIGH (ref 0–40)
Albumin/Globulin Ratio: 1.3 (ref 1.2–2.2)
Albumin: 4.2 g/dL (ref 3.8–4.8)
Alkaline Phosphatase: 103 IU/L (ref 48–121)
BUN/Creatinine Ratio: 16 (ref 12–28)
BUN: 14 mg/dL (ref 8–27)
Bilirubin Total: 0.2 mg/dL (ref 0.0–1.2)
CO2: 22 mmol/L (ref 20–29)
Calcium: 9.8 mg/dL (ref 8.7–10.3)
Chloride: 104 mmol/L (ref 96–106)
Creatinine, Ser: 0.86 mg/dL (ref 0.57–1.00)
GFR calc Af Amer: 80 mL/min/{1.73_m2} (ref >59)
GFR calc non Af Amer: 70 mL/min/{1.73_m2} (ref >59)
Globulin, Total: 3.2 g/dL (ref 1.5–4.5)
Glucose: 96 mg/dL (ref 65–99)
Potassium: 4.2 mmol/L (ref 3.5–5.2)
Sodium: 141 mmol/L (ref 134–144)
Total Protein: 7.4 g/dL (ref 6.0–8.5)

## 2019-10-28 LAB — CBC
Hematocrit: 38.8 % (ref 34.0–46.6)
Hemoglobin: 12.7 g/dL (ref 11.1–15.9)
MCH: 27.3 pg (ref 26.6–33.0)
MCHC: 32.7 g/dL (ref 31.5–35.7)
MCV: 83 fL (ref 79–97)
Platelets: 310 10*3/uL (ref 150–450)
RBC: 4.66 x10E6/uL (ref 3.77–5.28)
RDW: 14.5 % (ref 11.7–15.4)
WBC: 8.4 10*3/uL (ref 3.4–10.8)

## 2019-10-28 LAB — LIPID PANEL
Chol/HDL Ratio: 3.5 ratio (ref 0.0–4.4)
Cholesterol, Total: 185 mg/dL (ref 100–199)
HDL: 53 mg/dL (ref >39)
LDL Chol Calc (NIH): 117 mg/dL — ABNORMAL HIGH (ref 0–99)
Triglycerides: 79 mg/dL (ref 0–149)
VLDL Cholesterol Cal: 15 mg/dL (ref 5–40)

## 2019-10-28 LAB — HEMOGLOBIN A1C
Est. average glucose Bld gHb Est-mCnc: 131 mg/dL
Hgb A1c MFr Bld: 6.2 % — ABNORMAL HIGH (ref 4.8–5.6)

## 2019-10-28 LAB — VITAMIN D 25 HYDROXY (VIT D DEFICIENCY, FRACTURES): Vit D, 25-Hydroxy: 43.2 ng/mL (ref 30.0–100.0)

## 2019-10-28 LAB — TSH: TSH: 2.23 u[IU]/mL (ref 0.450–4.500)

## 2019-10-28 NOTE — Telephone Encounter (Signed)
Pls contact pt to review recent results. Vitamin D level is normal.  Blood count, kidney and thyroid function are normal. Blood sugar slightly increased, needs to continue to f limit/ control sugar and carbohydrate intake so she does not become diabetic, she is prediabetic. "LDL"  Which is the " Bad cholesterol" is ablove target of 99 or less at 117, needs to reduce fried and fatty foods, no changes in medication One of the  liver enzymes have increased and is above normal range, if taking tylenol regularly, reduce or stop this, limit alcohol if drinking regularly ,9 dont think so) and work on exercise and weight loss all these as they apply and this should help to normalize the  Value, will follow up No changes in medication  Please order fasting lipid, and hepatic panel Dec 15 or after, she has a Dec 22 visit

## 2019-12-20 ENCOUNTER — Ambulatory Visit: Payer: BC Managed Care – PPO | Attending: Family

## 2019-12-20 DIAGNOSIS — Z23 Encounter for immunization: Secondary | ICD-10-CM

## 2020-01-03 LAB — HM MAMMOGRAPHY

## 2020-02-07 ENCOUNTER — Ambulatory Visit (INDEPENDENT_AMBULATORY_CARE_PROVIDER_SITE_OTHER): Payer: BC Managed Care – PPO | Admitting: Family Medicine

## 2020-02-07 ENCOUNTER — Encounter: Payer: Self-pay | Admitting: Family Medicine

## 2020-02-07 ENCOUNTER — Other Ambulatory Visit: Payer: Self-pay

## 2020-02-07 VITALS — BP 138/75 | HR 61 | Resp 16 | Ht 65.0 in | Wt 177.0 lb

## 2020-02-07 DIAGNOSIS — Z23 Encounter for immunization: Secondary | ICD-10-CM | POA: Diagnosis not present

## 2020-02-07 DIAGNOSIS — M79672 Pain in left foot: Secondary | ICD-10-CM

## 2020-02-07 DIAGNOSIS — E785 Hyperlipidemia, unspecified: Secondary | ICD-10-CM | POA: Diagnosis not present

## 2020-02-07 DIAGNOSIS — R0683 Snoring: Secondary | ICD-10-CM

## 2020-02-07 DIAGNOSIS — E663 Overweight: Secondary | ICD-10-CM

## 2020-02-07 DIAGNOSIS — R7303 Prediabetes: Secondary | ICD-10-CM

## 2020-02-07 DIAGNOSIS — G8929 Other chronic pain: Secondary | ICD-10-CM | POA: Insufficient documentation

## 2020-02-07 DIAGNOSIS — J302 Other seasonal allergic rhinitis: Secondary | ICD-10-CM

## 2020-02-07 NOTE — Patient Instructions (Addendum)
F/u in early May, call if you need me before  Shingrix #1 today  Nurse visit in 2 to 3  months for nurse visit  For shingrix #2  Please get fasting lipid, cmp and egFR, hBA1c last week in February   You are referred to Podiatry because of chronic left foot pain  You NEED to get sleep study repeated because untreated sleep apnea, may lead to lung and heart failure, you will be referred for early April  It is important that you exercise regularly at least 30 minutes 5 times a week. If you develop chest pain, have severe difficulty breathing, or feel very tired, stop exercising immediately and seek medical attention   Think about what you will eat, plan ahead. Choose " clean, green, fresh or frozen" over canned, processed or packaged foods which are more sugary, salty and fatty. 70 to 75% of food eaten should be vegetables and fruit. Three meals at set times with snacks allowed between meals, but they must be fruit or vegetables. Aim to eat over a 12 hour period , example 7 am to 7 pm, and STOP after  your last meal of the day. Drink water,generally about 64 ounces per day, no other drink is as healthy. Fruit juice is best enjoyed in a healthy way, by EATING the fruit.  Thanks for choosing [facility], we consider it a privelige to serve you.  Best for 2022!

## 2020-02-07 NOTE — Assessment & Plan Note (Addendum)
approx 1 year h/o pain on dorsum of foot, at its worse a 10, limits ability to walk and stand. Refer podiatry

## 2020-02-13 ENCOUNTER — Encounter: Payer: Self-pay | Admitting: Family Medicine

## 2020-02-13 DIAGNOSIS — R0683 Snoring: Secondary | ICD-10-CM | POA: Insufficient documentation

## 2020-02-13 NOTE — Assessment & Plan Note (Signed)
Patient educated about the importance of limiting  Carbohydrate intake , the need to commit to daily physical activity for a minimum of 30 minutes , and to commit weight loss. The fact that changes in all these areas will reduce or eliminate all together the development of diabetes is stressed.   Diabetic Labs Latest Ref Rng & Units 10/27/2019 03/30/2019 10/04/2018 02/18/2018 08/27/2017  HbA1c 4.8 - 5.6 % 6.2(H) 6.1(H) 6.2(H) - -  Chol 100 - 199 mg/dL 601 093 235(T) 732 -  HDL >39 mg/dL 53 20(U) 54(Y) 51 -  Calc LDL 0 - 99 mg/dL 706(C) 98 376(E) 97 -  Triglycerides 0 - 149 mg/dL 79 98 831 91 -  Creatinine 0.57 - 1.00 mg/dL 5.17 6.16 0.73 - 7.10   BP/Weight 02/07/2020 09/07/2019 05/09/2019 03/23/2019 09/28/2018 02/24/2018 08/16/2017  Systolic BP 138 130 130 120 120 110 110  Diastolic BP 75 72 67 60 60 70 70  Wt. (Lbs) 177.04 175 178.6 172 181.08 183 185.08  BMI 29.46 30.04 30.66 29.52 31.08 31.41 29.87   No flowsheet data found.  Updated lab needed at/ before next visit.

## 2020-02-13 NOTE — Assessment & Plan Note (Signed)
Hyperlipidemia:Low fat diet discussed and encouraged.   Lipid Panel  Lab Results  Component Value Date   CHOL 185 10/27/2019   HDL 53 10/27/2019   LDLCALC 117 (H) 10/27/2019   TRIG 79 10/27/2019   CHOLHDL 3.5 10/27/2019     Needs to lower fat intake Updated lab needed at/ before next visit.

## 2020-02-13 NOTE — Assessment & Plan Note (Signed)
  Patient re-educated about  the importance of commitment to a  minimum of 150 minutes of exercise per week as able.  The importance of healthy food choices with portion control discussed, as well as eating regularly and within a 12 hour window most days. The need to choose "clean , green" food 50 to 75% of the time is discussed, as well as to make water the primary drink and set a goal of 64 ounces water daily.    Weight /BMI 02/07/2020 09/07/2019 05/09/2019  WEIGHT 177 lb 0.6 oz 175 lb 178 lb 9.6 oz  HEIGHT 5\' 5"  5\' 4"  5\' 4"   BMI 29.46 kg/m2 30.04 kg/m2 30.66 kg/m2

## 2020-02-13 NOTE — Assessment & Plan Note (Signed)
Controlled, no change in medication  

## 2020-02-13 NOTE — Assessment & Plan Note (Signed)
Dx with oSA in the past, due to cost never uses CPAP, refer for re evaluation

## 2020-02-13 NOTE — Progress Notes (Signed)
Kristin Blackwell     MRN: [id number]      DOB: 02-Sep-1950   HPI Kristin Blackwell is here for follow up and re-evaluation of chronic medical conditions, medication management and review of any available recent lab and radiology data.  Preventive health is updated, specifically  Cancer screening and Immunization.   Questions or concerns regarding consultations or procedures which the PT has had in the interim are  addressed. The PT denies any adverse reactions to current medications since the last visit.  C/o chroni left foot pain limiting walking, no inciting trauma C/o snoring with dx of sleep apnea, never got machine due to cost, willing to look at this now ROS Denies recent fever or chills. Denies sinus pressure, nasal congestion, ear pain or sore throat. Denies chest congestion, productive cough or wheezing. Denies chest pains, palpitations and leg swelling Denies abdominal pain, nausea, vomiting,diarrhea or constipation.   Denies dysuria, frequency, hesitancy or incontinence.  Denies headaches, seizures, numbness, or tingling. Denies depression, anxiety or insomnia. Denies skin break down or rash.   PE  BP 138/75   Pulse 61   Resp 16   Ht 5\' 5"  (1.651 m)   Wt 177 lb 0.6 oz (80.3 kg)   SpO2 98%   BMI 29.46 kg/m   Patient alert and oriented and in no cardiopulmonary distress.  HEENT: No facial asymmetry, EOMI,     Neck supple .  Chest: Clear to auscultation bilaterally.  CVS: S1, S2 no murmurs, no S3.Regular rate.  ABD: Soft non tender.   Ext: No edema  MS: Adequate ROM spine, shoulders, hips and knees. Tender over left foot Skin: Intact, no ulcerations or rash noted.  Psych: Good eye contact, normal affect. Memory intact not anxious or depressed appearing.  CNS: CN 2-12 intact, power,  normal throughout.no focal deficits noted.   Assessment & Plan  Chronic foot pain, left approx 1 year h/o pain on dorsum of foot, at its worse a 10, limits ability to walk  and stand. Refer podiatry  Hyperlipidemia LDL goal <100 Hyperlipidemia:Low fat diet discussed and encouraged.   Lipid Panel  Lab Results  Component Value Date   CHOL 185 10/27/2019   HDL 53 10/27/2019   LDLCALC 117 (H) 10/27/2019   TRIG 79 10/27/2019   CHOLHDL 3.5 10/27/2019     Needs to lower fat intake Updated lab needed at/ before next visit.   Overweight (BMI 25.0-29.9)  Patient re-educated about  the importance of commitment to a  minimum of 150 minutes of exercise per week as able.  The importance of healthy food choices with portion control discussed, as well as eating regularly and within a 12 hour window most days. The need to choose "clean , green" food 50 to 75% of the time is discussed, as well as to make water the primary drink and set a goal of 64 ounces water daily.    Weight /BMI 02/07/2020 09/07/2019 05/09/2019  WEIGHT 177 lb 0.6 oz 175 lb 178 lb 9.6 oz  HEIGHT 5\' 5"  5\' 4"  5\' 4"   BMI 29.46 kg/m2 30.04 kg/m2 30.66 kg/m2      Allergic rhinitis Controlled, no change in medication   Prediabetes Patient educated about the importance of limiting  Carbohydrate intake , the need to commit to daily physical activity for a minimum of 30 minutes , and to commit weight loss. The fact that changes in all these areas will reduce or eliminate all together the development of diabetes is stressed.  Diabetic Labs Latest Ref Rng & Units 10/27/2019 03/30/2019 10/04/2018 02/18/2018 08/27/2017  HbA1c 4.8 - 5.6 % 6.2(H) 6.1(H) 6.2(H) - -  Chol 100 - 199 mg/dL 433 295 188(C) 166 -  HDL >39 mg/dL 53 06(T) 01(S) 51 -  Calc LDL 0 - 99 mg/dL 010(X) 98 323(F) 97 -  Triglycerides 0 - 149 mg/dL 79 98 573 91 -  Creatinine 0.57 - 1.00 mg/dL 2.20 2.54 2.70 - 6.23   BP/Weight 02/07/2020 09/07/2019 05/09/2019 03/23/2019 09/28/2018 02/24/2018 08/16/2017  Systolic BP 138 130 130 120 120 110 110  Diastolic BP 75 72 67 60 60 70 70  Wt. (Lbs) 177.04 175 178.6 172 181.08 183 185.08  BMI 29.46 30.04  30.66 29.52 31.08 31.41 29.87   No flowsheet data found.  Updated lab needed at/ before next visit.   Snoring Dx with oSA in the past, due to cost never uses CPAP, refer for re evaluation

## 2020-02-21 ENCOUNTER — Other Ambulatory Visit: Payer: Self-pay

## 2020-02-21 ENCOUNTER — Ambulatory Visit (INDEPENDENT_AMBULATORY_CARE_PROVIDER_SITE_OTHER): Payer: Self-pay

## 2020-02-21 ENCOUNTER — Ambulatory Visit (INDEPENDENT_AMBULATORY_CARE_PROVIDER_SITE_OTHER): Payer: BC Managed Care – PPO | Admitting: Podiatry

## 2020-02-21 DIAGNOSIS — M79671 Pain in right foot: Secondary | ICD-10-CM

## 2020-02-21 DIAGNOSIS — M21619 Bunion of unspecified foot: Secondary | ICD-10-CM

## 2020-02-21 DIAGNOSIS — M79672 Pain in left foot: Secondary | ICD-10-CM

## 2020-02-21 DIAGNOSIS — M2042 Other hammer toe(s) (acquired), left foot: Secondary | ICD-10-CM

## 2020-02-21 DIAGNOSIS — M779 Enthesopathy, unspecified: Secondary | ICD-10-CM

## 2020-02-21 MED ORDER — TRIAMCINOLONE ACETONIDE 10 MG/ML IJ SUSP
10.0000 mg | Freq: Once | INTRAMUSCULAR | Status: AC
  Administered 2020-02-21: 10 mg

## 2020-02-22 NOTE — Progress Notes (Signed)
Subjective:   Patient ID: Kristin Blackwell, female   DOB: 70 y.o.   MRN: [id number]   HPI Patient presents stating she has had a lot of pain in her left ankle and she does have bad foot structure of both feet with large bunion and hammertoe deformity of both feet.  States that this pain has been present for several months and is worse when she stands for more than it short period of time.  Patient does not smoke likes to be active   Review of Systems  All other systems reviewed and are negative.       Objective:  Physical Exam Vitals and nursing note reviewed.  Constitutional:      Appearance: She is well-developed and well-nourished.  Cardiovascular:     Pulses: Intact distal pulses.  Pulmonary:     Effort: Pulmonary effort is normal.  Musculoskeletal:        General: Normal range of motion.  Skin:    General: Skin is warm.  Neurological:     Mental Status: She is alert.     Neurovascular status intact muscle strength adequate range of motion within normal limits with some splinting on the left subtalar joint due to pain.  There is discomfort in the sinus tarsi and then that extends into the peroneal complex with fluid buildup around this area.  Patient is found to have large bunion deformity bilateral with deviation of the hallux with elevated lesser toes that are painful when palpated with keratotic lesion digit 5 bilateral.  Good digital perfusion well oriented x3     Assessment:  Patient with chronic foot structural issues with probable subtalar joint inflammation left along with peroneal tendinitis with digital deformities bunion deformities     Plan:  H&P conditions reviewed and at this point working to focus on the ankle and I did inject the sinus tarsi 3 mg Kenalog 5 mg Xylocaine into the peroneal complex I discussed her other problems we could consider orthotics to try to give her more lift of her arch and also we could consider structural correction at 1 point in  future given that would be quite extensive.  Reappoint 4 weeks to reevaluate  X-rays indicate significant structural bunion deformity bilateral with hammertoe deformity and other pathology

## 2020-03-03 NOTE — Progress Notes (Signed)
   Covid-19 Vaccination Clinic  Name:  Kristin Blackwell    MRN: [id number] DOB: 1950-05-26  03/03/2020  [name] was observed post Covid-19 immunization for 15 minutes without incident. She was provided with Vaccine Information Sheet and instruction to access the V-Safe system.   [name] was instructed to call 911 with any severe reactions post vaccine: Marland Kitchen Difficulty breathing  . Swelling of face and throat  . A fast heartbeat  . A bad rash all over body  . Dizziness and weakness   Immunizations Administered    Name Date Dose VIS Date Route   Moderna COVID-19 Vaccine 12/20/2019 -- -- --   Moderna Covid-19 Booster Vaccine 12/20/2019 11:40 AM 0.25 mL 12/06/2019 Intramuscular   Manufacturer: Moderna   Lot: [id number]   [place]: [phone]

## 2020-03-20 ENCOUNTER — Other Ambulatory Visit: Payer: Self-pay

## 2020-03-20 ENCOUNTER — Ambulatory Visit: Payer: BC Managed Care – PPO | Admitting: Podiatry

## 2020-03-20 DIAGNOSIS — M2042 Other hammer toe(s) (acquired), left foot: Secondary | ICD-10-CM | POA: Diagnosis not present

## 2020-03-20 DIAGNOSIS — M21619 Bunion of unspecified foot: Secondary | ICD-10-CM | POA: Diagnosis not present

## 2020-03-20 DIAGNOSIS — M779 Enthesopathy, unspecified: Secondary | ICD-10-CM

## 2020-03-21 NOTE — Progress Notes (Signed)
Subjective:   Patient ID: Kristin Blackwell, female   DOB: 70 y.o.   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  Patient wants to pursue something but cannot do currently and today we discussed topical medicines oral medications shoe gear modifications

## 2020-04-26 ENCOUNTER — Ambulatory Visit (INDEPENDENT_AMBULATORY_CARE_PROVIDER_SITE_OTHER): Payer: BC Managed Care – PPO

## 2020-04-26 ENCOUNTER — Other Ambulatory Visit: Payer: Self-pay

## 2020-04-26 DIAGNOSIS — Z23 Encounter for immunization: Secondary | ICD-10-CM

## 2020-04-26 NOTE — Progress Notes (Signed)
Received shingrix #2

## 2020-04-27 LAB — CMP14+EGFR
ALT: 15 IU/L (ref 0–32)
AST: 24 IU/L (ref 0–40)
Albumin/Globulin Ratio: 1.6 (ref 1.2–2.2)
Albumin: 4.3 g/dL (ref 3.8–4.8)
Alkaline Phosphatase: 84 IU/L (ref 44–121)
BUN/Creatinine Ratio: 18 (ref 12–28)
BUN: 16 mg/dL (ref 8–27)
Bilirubin Total: 0.3 mg/dL (ref 0.0–1.2)
CO2: 22 mmol/L (ref 20–29)
Calcium: 9.1 mg/dL (ref 8.7–10.3)
Chloride: 106 mmol/L (ref 96–106)
Creatinine, Ser: 0.88 mg/dL (ref 0.57–1.00)
Globulin, Total: 2.7 g/dL (ref 1.5–4.5)
Glucose: 98 mg/dL (ref 65–99)
Potassium: 4.2 mmol/L (ref 3.5–5.2)
Sodium: 142 mmol/L (ref 134–144)
Total Protein: 7 g/dL (ref 6.0–8.5)
eGFR: 71 mL/min/{1.73_m2} (ref >59)

## 2020-04-27 LAB — LIPID PANEL
Chol/HDL Ratio: 3.3 ratio (ref 0.0–4.4)
Cholesterol, Total: 153 mg/dL (ref 100–199)
HDL: 46 mg/dL (ref >39)
LDL Chol Calc (NIH): 91 mg/dL (ref 0–99)
Triglycerides: 84 mg/dL (ref 0–149)
VLDL Cholesterol Cal: 16 mg/dL (ref 5–40)

## 2020-04-27 LAB — HEMOGLOBIN A1C
Est. average glucose Bld gHb Est-mCnc: 131 mg/dL
Hgb A1c MFr Bld: 6.2 % — ABNORMAL HIGH (ref 4.8–5.6)

## 2020-06-25 ENCOUNTER — Encounter: Payer: Self-pay | Admitting: Family Medicine

## 2020-06-25 ENCOUNTER — Other Ambulatory Visit: Payer: Self-pay

## 2020-06-25 ENCOUNTER — Ambulatory Visit: Payer: BC Managed Care – PPO | Admitting: Family Medicine

## 2020-06-25 VITALS — BP 130/66 | HR 86 | Temp 97.6°F | Ht 65.0 in | Wt 180.0 lb

## 2020-06-25 DIAGNOSIS — R7303 Prediabetes: Secondary | ICD-10-CM

## 2020-06-25 DIAGNOSIS — M5410 Radiculopathy, site unspecified: Secondary | ICD-10-CM | POA: Diagnosis not present

## 2020-06-25 DIAGNOSIS — J302 Other seasonal allergic rhinitis: Secondary | ICD-10-CM

## 2020-06-25 DIAGNOSIS — D126 Benign neoplasm of colon, unspecified: Secondary | ICD-10-CM | POA: Insufficient documentation

## 2020-06-25 DIAGNOSIS — E785 Hyperlipidemia, unspecified: Secondary | ICD-10-CM | POA: Diagnosis not present

## 2020-06-25 DIAGNOSIS — E663 Overweight: Secondary | ICD-10-CM

## 2020-06-25 DIAGNOSIS — R7301 Impaired fasting glucose: Secondary | ICD-10-CM

## 2020-06-25 DIAGNOSIS — E559 Vitamin D deficiency, unspecified: Secondary | ICD-10-CM

## 2020-06-25 MED ORDER — MONTELUKAST SODIUM 10 MG PO TABS: 10.0000 mg | ORAL_TABLET | Freq: Every day | ORAL | 3 refills | Status: DC

## 2020-06-25 NOTE — Assessment & Plan Note (Addendum)
5 day h/o LBP radiating to left thigh a 6 after mopping, no incontinence of stool or urine, needs x ray lumbar spine

## 2020-06-25 NOTE — Patient Instructions (Addendum)
F/u in 5 months, call if you need me sooner  Excellent blood pressure, cholesterol and kidney function  Fasting lipid, cmp and EGFR, cBC, TSH, Vit D and HBA1C 5 days before visit  Please commit to exercise for  5 days per week , 30 mins per session  Weight loss goal of 5 to 10  pounds  Reduce/ stop fast food/junk food, and increase vegetable and fruit  Thanks for choosing  [facility], we consider it a privelige to serve you.

## 2020-06-26 ENCOUNTER — Encounter: Payer: Self-pay | Admitting: Family Medicine

## 2020-06-26 NOTE — Assessment & Plan Note (Signed)
  Patient re-educated about  the importance of commitment to a  minimum of 150 minutes of exercise per week as able.  The importance of healthy food choices with portion control discussed, as well as eating regularly and within a 12 hour window most days. The need to choose "clean , green" food 50 to 75% of the time is discussed, as well as to make water the primary drink and set a goal of 64 ounces water daily.    Weight /BMI 06/25/2020 02/07/2020 09/07/2019  WEIGHT 180 lb 177 lb 0.6 oz 175 lb  HEIGHT 5\' 5"  5\' 5"  5\' 4"   BMI 29.95 kg/m2 29.46 kg/m2 30.04 kg/m2

## 2020-06-26 NOTE — Assessment & Plan Note (Signed)
Hyperlipidemia:Low fat diet discussed and encouraged.   Lipid Panel  Lab Results  Component Value Date   CHOL 153 04/26/2020   HDL 46 04/26/2020   LDLCALC 91 04/26/2020   TRIG 84 04/26/2020   CHOLHDL 3.3 04/26/2020     Controlled, no change in medication

## 2020-06-26 NOTE — Assessment & Plan Note (Signed)
Patient educated about the importance of limiting  Carbohydrate intake , the need to commit to daily physical activity for a minimum of 30 minutes , and to commit weight loss. The fact that changes in all these areas will reduce or eliminate all together the development of diabetes is stressed.  Unchanged, needs to lower carb inake  Diabetic Labs Latest Ref Rng & Units 04/26/2020 10/27/2019 03/30/2019 10/04/2018 02/18/2018  HbA1c 4.8 - 5.6 % 6.2(H) 6.2(H) 6.1(H) 6.2(H) -  Chol 100 - 199 mg/dL 153 185 166 208(H) 167  HDL >39 mg/dL 46 53 49(L) 47(L) 51  Calc LDL 0 - 99 mg/dL 91 117(H) 98 135(H) 97  Triglycerides 0 - 149 mg/dL 84 79 98 136 91  Creatinine 0.57 - 1.00 mg/dL 0.88 0.86 0.82 0.83 -   BP/Weight 06/25/2020 02/07/2020 09/07/2019 05/09/2019 03/23/2019 1/61/0960 05/21/4096  Systolic BP 119 147 829 562 130 865 784  Diastolic BP 66 75 72 67 60 60 70  Wt. (Lbs) 180 177.04 175 178.6 172 181.08 183  BMI 29.95 29.46 30.04 30.66 29.52 31.08 31.41   No flowsheet data found.

## 2020-06-26 NOTE — Assessment & Plan Note (Signed)
Controlled, no change in medication  

## 2020-06-26 NOTE — Progress Notes (Signed)
Kristin Blackwell     MRN: [id number]      DOB: 01-May-1950   HPI Kristin Blackwell is here for follow up and re-evaluation of chronic medical conditions, medication management and review of any available recent lab and radiology data.  Preventive health is updated, specifically  Cancer screening and Immunization.   Questions or concerns regarding consultations or procedures which the PT has had in the interim are  addressed. The PT denies any adverse reaction5 day h/o lBP to left hip and leg after  Excessive mopping  ROS Denies recent fever or chills. Denies sinus pressure, nasal congestion, ear pain or sore throat. Denies chest congestion, productive cough or wheezing. Denies chest pains, palpitations and leg swelling Denies abdominal pain, nausea, vomiting,diarrhea or constipation.   Denies dysuria, frequency, hesitancy or incontinenc Denies headaches, seizures, numbness, or tingling. Denies depression, anxiety or insomnia. Denies skin break down or rash.   PE  BP 130/66 (BP Location: Right Arm, Patient Position: Sitting, Cuff Size: Large)   Pulse 86   Temp 97.6 F (36.4 C) (Temporal)   Ht 5\' 5"  (1.651 m)   Wt 180 lb (81.6 kg)   SpO2 100%   BMI 29.95 kg/m   Patient alert and oriented and in no cardiopulmonary distress.  HEENT: No facial asymmetry, EOMI,     Neck supple .  Chest: Clear to auscultation bilaterally.  CVS: S1, S2 no murmurs, no S3.Regular rate.  ABD: Soft non tender.   Ext: No edema  MS: Adequate ROM spine, shoulders, hips and knees.  Skin: Intact, no ulcerations or rash noted.  Psych: Good eye contact, normal affect. Memory intact not anxious or depressed appearing.  CNS: CN 2-12 intact, power,  normal throughout.no focal deficits noted.   Assessment & Plan  Back pain with left-sided radiculopathy 5 day h/o LBP radiating to left thigh a 6 after mopping, no incontinence of stool or urine, needs x ray lumbar spine  Hyperlipidemia LDL goal  <100 Hyperlipidemia:Low fat diet discussed and encouraged.   Lipid Panel  Lab Results  Component Value Date   CHOL 153 04/26/2020   HDL 46 04/26/2020   LDLCALC 91 04/26/2020   TRIG 84 04/26/2020   CHOLHDL 3.3 04/26/2020     Controlled, no change in medication   Prediabetes Patient educated about the importance of limiting  Carbohydrate intake , the need to commit to daily physical activity for a minimum of 30 minutes , and to commit weight loss. The fact that changes in all these areas will reduce or eliminate all together the development of diabetes is stressed.  Unchanged, needs to lower carb inake  Diabetic Labs Latest Ref Rng & Units 04/26/2020 10/27/2019 03/30/2019 10/04/2018 02/18/2018  HbA1c 4.8 - 5.6 % 6.2(H) 6.2(H) 6.1(H) 6.2(H) -  Chol 100 - 199 mg/dL 153 185 166 208(H) 167  HDL >39 mg/dL 46 53 49(L) 47(L) 51  Calc LDL 0 - 99 mg/dL 91 117(H) 98 135(H) 97  Triglycerides 0 - 149 mg/dL 84 79 98 136 91  Creatinine 0.57 - 1.00 mg/dL 0.88 0.86 0.82 0.83 -   BP/Weight 06/25/2020 02/07/2020 09/07/2019 05/09/2019 03/23/2019 07/18/930 04/21/5730  Systolic BP 202 542 706 237 628 315 176  Diastolic BP 66 75 72 67 60 60 70  Wt. (Lbs) 180 177.04 175 178.6 172 181.08 183  BMI 29.95 29.46 30.04 30.66 29.52 31.08 31.41   No flowsheet data found.    Overweight (BMI 25.0-29.9)  Patient re-educated about  the importance of commitment  to a  minimum of 150 minutes of exercise per week as able.  The importance of healthy food choices with portion control discussed, as well as eating regularly and within a 12 hour window most days. The need to choose "clean , green" food 50 to 75% of the time is discussed, as well as to make water the primary drink and set a goal of 64 ounces water daily.    Weight /BMI 06/25/2020 02/07/2020 09/07/2019  WEIGHT 180 lb 177 lb 0.6 oz 175 lb  HEIGHT 5\' 5"  5\' 5"  5\' 4"   BMI 29.95 kg/m2 29.46 kg/m2 30.04 kg/m2      Allergic rhinitis Controlled, no change in  medication

## 2020-07-05 ENCOUNTER — Telehealth: Payer: BC Managed Care – PPO | Admitting: Internal Medicine

## 2020-07-05 ENCOUNTER — Encounter: Payer: Self-pay | Admitting: Internal Medicine

## 2020-07-05 ENCOUNTER — Other Ambulatory Visit: Payer: Self-pay

## 2020-07-05 DIAGNOSIS — J209 Acute bronchitis, unspecified: Secondary | ICD-10-CM

## 2020-07-05 DIAGNOSIS — J302 Other seasonal allergic rhinitis: Secondary | ICD-10-CM

## 2020-07-05 DIAGNOSIS — R062 Wheezing: Secondary | ICD-10-CM

## 2020-07-05 MED ORDER — AZITHROMYCIN 250 MG PO TABS: ORAL_TABLET | ORAL | 0 refills | Status: AC

## 2020-07-05 MED ORDER — ALBUTEROL SULFATE HFA 108 (90 BASE) MCG/ACT IN AERS
2.0000 | INHALATION_SPRAY | Freq: Four times a day (QID) | RESPIRATORY_TRACT | 0 refills | Status: DC | PRN
Start: 2020-07-05 — End: 2020-07-29

## 2020-07-05 NOTE — Progress Notes (Signed)
Virtual Visit via Telephone Note   This visit type was conducted due to national recommendations for restrictions regarding the COVID-19 Pandemic (e.g. social distancing) in an effort to limit this patient's exposure and mitigate transmission in our community.  Due to her co-morbid illnesses, this patient is at least at moderate risk for complications without adequate follow up.  This format is felt to be most appropriate for this patient at this time.  The patient did not have access to video technology/had technical difficulties with video requiring transitioning to audio format only (telephone).  All issues noted in this document were discussed and addressed.  No physical exam could be performed with this format.  Evaluation Performed:  Follow-up visit  Date:  07/05/2020   ID:  Kristin, Blackwell 26-Jul-1950, MRN [id number]  Patient Location: Home Provider Location: Office/Clinic  Participants: Patient Location of Patient: Home Location of Provider: Telehealth Consent was obtain for visit to be over via telehealth. I verified that I am speaking with the correct person using two identifiers.  PCP:  Fayrene Helper, MD   Chief Complaint:  Cough, nasal congestion and headache  History of Present Illness:    Kristin Blackwell is a 70 y.o. female who has a televisit for c/o cough, nasal congestion and headache for last 4 days. She also reports wheezing on the first day when she had symptoms. She has been taking OTC sinus medication with no relief. She had negative home COVID test.   The patient does have symptoms concerning for COVID-19 infection (fever, chills, cough, or new shortness of breath).   Past Medical, Surgical, Social History, Allergies, and Medications have been Reviewed.  Past Medical History:  Diagnosis Date  . Hyperlipidemia   . Nicotine addiction   . Overweight(278.02)    Past Surgical History:  Procedure Laterality Date  . PARTIAL HYSTERECTOMY  1987    had one ovary removed      Current Meds  Medication Sig  . Cholecalciferol (VITAMIN D) 50 MCG (2000 UT) CAPS Take 1 capsule by mouth daily.  . fluticasone (FLONASE) 50 MCG/ACT nasal spray Place 2 sprays into both nostrils daily.  . montelukast (SINGULAIR) 10 MG tablet Take 1 tablet (10 mg total) by mouth at bedtime.  . Multiple Vitamins-Minerals (HAIR/SKIN/NAILS/BIOTIN PO) Take 1 capsule by mouth daily.  . rosuvastatin (CRESTOR) 10 MG tablet TAKE ONE TABLET EVERY EVENING ON MONDAY, WEDNESDAY AND FRIDAY     Allergies:   Ibandronate sodium, Lipitor [atorvastatin], and Pravastatin   ROS:   Please see the history of present illness.     All other systems reviewed and are negative.   Labs/Other Tests and Data Reviewed:    Recent Labs: 10/27/2019: Hemoglobin 12.7; Platelets 310; TSH 2.230 04/26/2020: ALT 15; BUN 16; Creatinine, Ser 0.88; Potassium 4.2; Sodium 142   Recent Lipid Panel Lab Results  Component Value Date/Time   CHOL 153 04/26/2020 08:39 AM   TRIG 84 04/26/2020 08:39 AM   HDL 46 04/26/2020 08:39 AM   CHOLHDL 3.3 04/26/2020 08:39 AM   CHOLHDL 3.4 03/30/2019 10:06 AM   LDLCALC 91 04/26/2020 08:39 AM   LDLCALC 98 03/30/2019 10:06 AM    Wt Readings from Last 3 Encounters:  06/25/20 180 lb (81.6 kg)  02/07/20 177 lb 0.6 oz (80.3 kg)  09/07/19 175 lb (79.4 kg)      ASSESSMENT & PLAN:    Acute bronchitis Symptoms suggestive of bronchitis Advised to get COVID RT-PCR Patient has had 3  doses of COVID vaccine. Empiric Azithromycin Albuterol PRN for dyspnea/wheezing Robitussin PRN for cough  Allergic rhinitis Continue Flonase  Time:   Today, I have spent 13 minutes reviewing the chart, including problem list, medications, and with the patient with telehealth technology discussing the above problems.   Medication Adjustments/Labs and Tests Ordered: Current medicines are reviewed at length with the patient today.  Concerns regarding medicines are outlined  above.   Tests Ordered: No orders of the defined types were placed in this encounter.   Medication Changes: No orders of the defined types were placed in this encounter.    Note: This dictation was prepared with Dragon dictation along with smaller phrase technology. Similar sounding words can be transcribed inadequately or may not be corrected upon review. Any transcriptional errors that result from this process are unintentional.      Disposition:  Follow up  Signed, Lindell Spar, MD  07/05/2020 9:38 AM     [place]

## 2020-07-05 NOTE — Patient Instructions (Signed)

## 2020-07-10 ENCOUNTER — Other Ambulatory Visit: Payer: Self-pay | Admitting: Internal Medicine

## 2020-07-10 ENCOUNTER — Telehealth: Payer: Self-pay

## 2020-07-10 NOTE — Telephone Encounter (Signed)
Patient called having headaches in the mornings and she took a home covid test it was positive and the [facility] is  pending results. She just spoke to provider on 5/20 she needs to know what she needs to do. Call back # [phone].

## 2020-07-10 NOTE — Telephone Encounter (Signed)
Please advise her that she is outside of window period of antiviral treatment. She should continue the treatment I sent and symptomatic treatment.

## 2020-07-11 ENCOUNTER — Telehealth: Payer: Self-pay

## 2020-07-11 NOTE — Telephone Encounter (Signed)
Called pt let her know she was upset that she could not get antiviral treatment. I explained to her this does no good if it is not within the 5 day window of symptom onset per provider to continue her allergy meds mucinex and plenty of fluids. She was upset as she finished antibiotic and it was only 5 days. Advised that this continues to work after finsihing. She stated she felt better but did not understanding why she could not get antiviral treatment. Re explained that this would do no good after 5 days of symptom onset. Asked if she would like to do another virtual with provider to discuss further she stated no she does not want to keep getting billed when provider would tell her the same thing. Advised if she was no better by Monday we could certainly do another visit advised her to reach out and let us know.

## 2020-07-11 NOTE — Telephone Encounter (Signed)
Called pt see other phone message

## 2020-07-11 NOTE — Telephone Encounter (Signed)
Patient called she received her covid test results last night and it said it was positive. What does she need to do now. Patient call back # [phone].

## 2020-07-23 ENCOUNTER — Other Ambulatory Visit: Payer: Self-pay

## 2020-07-23 ENCOUNTER — Encounter: Payer: Self-pay | Admitting: Pulmonary Disease

## 2020-07-23 ENCOUNTER — Ambulatory Visit: Payer: BC Managed Care – PPO | Admitting: Pulmonary Disease

## 2020-07-23 VITALS — BP 148/92 | HR 70 | Temp 96.2°F | Ht 64.0 in | Wt 176.8 lb

## 2020-07-23 DIAGNOSIS — R0683 Snoring: Secondary | ICD-10-CM

## 2020-07-23 NOTE — Progress Notes (Signed)
[facility]  Chief Complaint  Patient presents with  . Consult    Sleep consult    Constitutional:  BP (!) 148/92 (BP Location: Left Arm, Cuff Size: Normal)   Pulse 70   Temp (!) 96.2 F (35.7 C) (Temporal)   Ht 5\' 4"  (1.626 m)   Wt 176 lb 12.8 oz (80.2 kg)   SpO2 99% Comment: Room air  BMI 30.35 kg/m   Past Medical History:  HLD, Allergies, Asthma, Vit D deficiency  Past Surgical History:  She  has a past surgical history that includes Partial hysterectomy (1987).  Brief Summary:  Kristin Blackwell is a 70 y.o. female former smoker with snoring.      Subjective:   She had home sleep study in 2016.  Showed moderate sleep apnea.  Never started on therapy.  She has bruxism and uses a mouthguard.  She continues to have snoring.  She wakes up feeling choked.  She is a restless sleeper.  She has trouble staying focused when working on a computer.  She goes to sleep at 10 pm.  She falls asleep in few minutes.  She wakes up 1 or 2 times to use the bathroom.  She gets out of bed at 9 am.  She feels tired in the morning.  She denies morning headache.  She does not use anything to help her fall sleep or stay awake.  She denies sleep walking, sleep talking, bruxism, or nightmares.  There is no history of restless legs.  She denies sleep hallucinations, sleep paralysis, or cataplexy.  The Epworth score is 2 out of 24.    Physical Exam:   Appearance - well kempt   ENMT - no sinus tenderness, no oral exudate, no LAN, Mallampati 3 airway, no stridor  Respiratory - equal breath sounds bilaterally, no wheezing or rales  CV - s1s2 regular rate and rhythm, no murmurs  Ext - no clubbing, no edema  Skin - no rashes  Psych - normal mood and affect   Sleep Tests:   HST 01/07/15 >> AHI 25.3, SpO2 low 77%  Social History:  She  reports that she quit smoking about 8 years ago. Her smoking use included cigarettes. She has a 30.00  pack-year smoking history. She has never used smokeless tobacco. She reports current alcohol use. She reports that she does not use drugs.  Family History:  Her family history includes Diabetes in her brother; Emphysema in her father; Hypertension in her mother and sister.    Discussion:  She has snoring, sleep disruption, apnea, and daytime sleepiness.  She has prior sleep study showing moderate obstructive sleep apnea.  I am concerned she still has significant sleep apnea.  Assessment/Plan:   Snoring with excessive daytime sleepiness. - will need to arrange for a home sleep study  Obesity. - discussed how weight can impact sleep and risk for sleep disordered breathing - discussed options to assist with weight loss: combination of diet modification, cardiovascular and strength training exercises  Cardiovascular risk. - had an extensive discussion regarding the adverse health consequences related to untreated sleep disordered breathing - specifically discussed the risks for hypertension, coronary artery disease, cardiac dysrhythmias, cerebrovascular disease, and diabetes - lifestyle modification discussed  Safe driving practices. - discussed how sleep disruption can increase risk of accidents, particularly when driving - safe driving practices were discussed  Therapies for obstructive sleep apnea. - if the sleep study shows significant sleep apnea, then various therapies  for treatment were reviewed: CPAP, oral appliance, and surgical interventions  Time Spent Involved in Patient Care on Day of Examination:  32 minutes  Follow up:  Patient Instructions  Will arrange for home sleep study Will call to arrange for follow up after sleep study reviewed    Medication List:   Allergies as of 07/23/2020      Reactions   Ibandronate Sodium    Lipitor [atorvastatin]    Muscle aches   Pravastatin    Muscle aches      Medication List       Accurate as of July 23, 2020 10:36 AM.  If you have any questions, ask your nurse or doctor.        albuterol 108 (90 Base) MCG/ACT inhaler Commonly known as: VENTOLIN HFA Inhale 2 puffs into the lungs every 6 (six) hours as needed for wheezing or shortness of breath.   fluticasone 50 MCG/ACT nasal spray Commonly known as: FLONASE Place 2 sprays into both nostrils daily.   HAIR/SKIN/NAILS/BIOTIN PO Take 1 capsule by mouth daily.   montelukast 10 MG tablet Commonly known as: SINGULAIR Take 1 tablet (10 mg total) by mouth at bedtime.   rosuvastatin 10 MG tablet Commonly known as: CRESTOR TAKE ONE TABLET EVERY EVENING ON MONDAY, WEDNESDAY AND FRIDAY   Vitamin D 50 MCG (2000 UT) Caps Take 1 capsule by mouth daily.       Signature:  Chesley Mires, MD [place] Pager - [phone] 07/23/2020, 10:36 AM

## 2020-07-23 NOTE — Patient Instructions (Signed)
Will arrange for home sleep study Will call to arrange for follow up after sleep study reviewed  

## 2020-07-27 ENCOUNTER — Other Ambulatory Visit: Payer: Self-pay | Admitting: Internal Medicine

## 2020-07-27 DIAGNOSIS — R062 Wheezing: Secondary | ICD-10-CM

## 2020-09-03 ENCOUNTER — Other Ambulatory Visit: Payer: Self-pay

## 2020-09-03 ENCOUNTER — Ambulatory Visit: Payer: BC Managed Care – PPO

## 2020-09-03 DIAGNOSIS — R0683 Snoring: Secondary | ICD-10-CM

## 2020-09-03 DIAGNOSIS — G4733 Obstructive sleep apnea (adult) (pediatric): Secondary | ICD-10-CM | POA: Diagnosis not present

## 2020-09-05 ENCOUNTER — Telehealth: Payer: Self-pay | Admitting: Pulmonary Disease

## 2020-09-05 DIAGNOSIS — G4733 Obstructive sleep apnea (adult) (pediatric): Secondary | ICD-10-CM | POA: Diagnosis not present

## 2020-09-05 NOTE — Telephone Encounter (Signed)
HST 09/03/20 >> AHI 51.6, SpO2 low 72%  Please inform her that her sleep study shows severe obstructive sleep apnea.  Please arrange for ROV with me or NP to discuss treatment options.

## 2020-09-11 NOTE — Telephone Encounter (Signed)
Patient returning call.  [phone]

## 2020-09-11 NOTE — Telephone Encounter (Signed)
Called and spoke with patient to let her know of HST results from Dr. Halford Chessman and to get her scheduled for follow up. She is now scheduled with Dr. Halford Chessman for 09/17/20 to discuss results and treatment options. Nothing further needed at this time.

## 2020-09-11 NOTE — Telephone Encounter (Signed)
ATC patient to go over sleep study results, LMTCB

## 2020-09-17 ENCOUNTER — Encounter: Payer: Self-pay | Admitting: Pulmonary Disease

## 2020-09-17 ENCOUNTER — Other Ambulatory Visit: Payer: Self-pay

## 2020-09-17 ENCOUNTER — Ambulatory Visit: Payer: BC Managed Care – PPO | Admitting: Pulmonary Disease

## 2020-09-17 VITALS — BP 124/82 | HR 67 | Temp 98.4°F | Ht 65.0 in | Wt 178.0 lb

## 2020-09-17 DIAGNOSIS — G4733 Obstructive sleep apnea (adult) (pediatric): Secondary | ICD-10-CM | POA: Diagnosis not present

## 2020-09-17 DIAGNOSIS — Z7189 Other specified counseling: Secondary | ICD-10-CM

## 2020-09-17 NOTE — Progress Notes (Signed)
[facility]  Chief Complaint  Patient presents with   Follow-up    Patient did HST on 09/03/20 and is here to discuss results and treatment options.    Constitutional:  BP 124/82 (BP Location: Left Arm, Patient Position: Sitting, Cuff Size: Normal)   Pulse 67   Temp 98.4 F (36.9 C) (Oral)   Ht '5\' 5"'$  (1.651 m)   Wt 178 lb (80.7 kg)   SpO2 100%   BMI 29.62 kg/m   Past Medical History:  HLD, Allergies, Asthma, Vit D deficiency  Past Surgical History:  She  has a past surgical history that includes Partial hysterectomy (1987).  Brief Summary:  Kristin Blackwell is a 70 y.o. female former smoker with obstructive sleep apnea.      Subjective:   She had home sleep study in July.  Shows severe obstructive sleep apnea.   Physical Exam:   Appearance - well kempt   ENMT - no sinus tenderness, no oral exudate, no LAN, Mallampati 3 airway, no stridor  Respiratory - equal breath sounds bilaterally, no wheezing or rales  CV - s1s2 regular rate and rhythm, no murmurs  Ext - no clubbing, no edema  Skin - no rashes  Psych - normal mood and affect   Sleep Tests:  HST 01/07/15 >> AHI 25.3, SpO2 low 77% HST 09/03/20 >> AHI 51.6, SpO2 low 72%  Social History:  She  reports that she quit smoking about 8 years ago. Her smoking use included cigarettes. She has a 30.00 pack-year smoking history. She has never used smokeless tobacco. She reports current alcohol use. She reports that she does not use drugs.  Family History:  Her family history includes Diabetes in her brother; Emphysema in her father; Hypertension in her mother and sister.     Assessment/Plan:   Obstructive sleep apnea. - reviewed her sleep study results - discussed treatment options - discussed how untreated sleep apnea can impact her health - will arrange for auto CPAP set up - she has machine from her husband that is no longer in use and might want to use this;  was set up through [place] - she will check with her dentist about whether she could be a candidate for an oral appliance  Time Spent Involved in Patient Care on Day of Examination:  22 minutes  Follow up:   Patient Instructions  Will arrange for auto CPAP set up  Check with your dentist if you are a candidate for an oral appliance to treat obstructive sleep apnea  Follow up in 4 months  Medication List:   Allergies as of 09/17/2020       Reactions   Ibandronate Sodium    Lipitor [atorvastatin]    Muscle aches   Pravastatin    Muscle aches        Medication List        Accurate as of September 17, 2020 10:11 AM. If you have any questions, ask your nurse or doctor.          albuterol 108 (90 Base) MCG/ACT inhaler Commonly known as: VENTOLIN HFA TAKE 2 PUFFS BY MOUTH EVERY 6 HOURS AS NEEDED FOR WHEEZE OR SHORTNESS OF BREATH   fluticasone 50 MCG/ACT nasal spray Commonly known as: FLONASE Place 2 sprays into both nostrils daily.   HAIR/SKIN/NAILS/BIOTIN PO Take 1 capsule by mouth daily.   montelukast 10 MG tablet Commonly known as: SINGULAIR Take 1 tablet (10 mg total) by mouth  at bedtime.   rosuvastatin 10 MG tablet Commonly known as: CRESTOR TAKE ONE TABLET EVERY EVENING ON MONDAY, WEDNESDAY AND FRIDAY   Vitamin D 50 MCG (2000 UT) Caps Take 1 capsule by mouth daily.        Signature:  Chesley Mires, MD [place] Pager - [phone] 09/17/2020, 10:11 AM

## 2020-09-17 NOTE — Patient Instructions (Signed)
Will arrange for auto CPAP set up  Check with your dentist if you are a candidate for an oral appliance to treat obstructive sleep apnea  Follow up in 4 months

## 2020-09-30 ENCOUNTER — Telehealth: Payer: Self-pay | Admitting: Pulmonary Disease

## 2020-09-30 NOTE — Telephone Encounter (Signed)
Noted.   VS you wanted her back in 4 months. She has gotten her cpap machine. Would you like [place] to go ahead and schedule a 31-90 day f/u appt for cpap compliance.

## 2020-10-01 NOTE — Telephone Encounter (Signed)
Attempted to call pt but unable to reach. Left message for her to return call. 

## 2020-10-01 NOTE — Telephone Encounter (Signed)
Please schedule ROV with me or NP for CPAP compliance follow up.

## 2020-10-11 NOTE — Telephone Encounter (Signed)
Left message for patient to call back  

## 2020-10-18 NOTE — Telephone Encounter (Signed)
Called pt again and there was no answer- ok per DPR left her a detailed msg on her machine letting her know we need her to f/u 31-90 days after CPAP start for ins purposes.

## 2020-11-09 ENCOUNTER — Other Ambulatory Visit: Payer: Self-pay | Admitting: Family Medicine

## 2020-11-23 LAB — CMP14+EGFR
ALT: 15 IU/L (ref 0–32)
AST: 21 IU/L (ref 0–40)
Albumin/Globulin Ratio: 1.3 (ref 1.2–2.2)
Albumin: 4.4 g/dL (ref 3.8–4.8)
Alkaline Phosphatase: 89 IU/L (ref 44–121)
BUN/Creatinine Ratio: 18 (ref 12–28)
BUN: 16 mg/dL (ref 8–27)
Bilirubin Total: 0.2 mg/dL (ref 0.0–1.2)
CO2: 21 mmol/L (ref 20–29)
Calcium: 9.6 mg/dL (ref 8.7–10.3)
Chloride: 106 mmol/L (ref 96–106)
Creatinine, Ser: 0.88 mg/dL (ref 0.57–1.00)
Globulin, Total: 3.3 g/dL (ref 1.5–4.5)
Glucose: 92 mg/dL (ref 70–99)
Potassium: 4.5 mmol/L (ref 3.5–5.2)
Sodium: 141 mmol/L (ref 134–144)
Total Protein: 7.7 g/dL (ref 6.0–8.5)
eGFR: 71 mL/min/{1.73_m2} (ref >59)

## 2020-11-23 LAB — VITAMIN D 25 HYDROXY (VIT D DEFICIENCY, FRACTURES): Vit D, 25-Hydroxy: 47.3 ng/mL (ref 30.0–100.0)

## 2020-11-23 LAB — LIPID PANEL
Chol/HDL Ratio: 4 ratio (ref 0.0–4.4)
Cholesterol, Total: 175 mg/dL (ref 100–199)
HDL: 44 mg/dL (ref >39)
LDL Chol Calc (NIH): 112 mg/dL — ABNORMAL HIGH (ref 0–99)
Triglycerides: 105 mg/dL (ref 0–149)
VLDL Cholesterol Cal: 19 mg/dL (ref 5–40)

## 2020-11-23 LAB — HEMOGLOBIN A1C
Est. average glucose Bld gHb Est-mCnc: 134 mg/dL
Hgb A1c MFr Bld: 6.3 % — ABNORMAL HIGH (ref 4.8–5.6)

## 2020-11-23 LAB — CBC
Hematocrit: 38.7 % (ref 34.0–46.6)
Hemoglobin: 12.7 g/dL (ref 11.1–15.9)
MCH: 27.1 pg (ref 26.6–33.0)
MCHC: 32.8 g/dL (ref 31.5–35.7)
MCV: 83 fL (ref 79–97)
Platelets: 295 10*3/uL (ref 150–450)
RBC: 4.68 x10E6/uL (ref 3.77–5.28)
RDW: 14.5 % (ref 11.7–15.4)
WBC: 6.6 10*3/uL (ref 3.4–10.8)

## 2020-11-26 ENCOUNTER — Encounter: Payer: Self-pay | Admitting: Family Medicine

## 2020-11-26 ENCOUNTER — Other Ambulatory Visit: Payer: Self-pay

## 2020-11-26 ENCOUNTER — Ambulatory Visit: Payer: BC Managed Care – PPO | Admitting: Family Medicine

## 2020-11-26 ENCOUNTER — Ambulatory Visit (HOSPITAL_COMMUNITY)
Admission: RE | Admit: 2020-11-26 | Discharge: 2020-11-26 | Disposition: A | Discharge status: Home / Self Care | Payer: BC Managed Care – PPO | Source: Ambulatory Visit | Attending: Family Medicine | Admitting: Family Medicine

## 2020-11-26 VITALS — BP 150/94 | HR 59 | Resp 16 | Ht 65.0 in | Wt 182.0 lb

## 2020-11-26 DIAGNOSIS — E785 Hyperlipidemia, unspecified: Secondary | ICD-10-CM | POA: Diagnosis not present

## 2020-11-26 DIAGNOSIS — R7303 Prediabetes: Secondary | ICD-10-CM | POA: Diagnosis not present

## 2020-11-26 DIAGNOSIS — R030 Elevated blood-pressure reading, without diagnosis of hypertension: Secondary | ICD-10-CM

## 2020-11-26 DIAGNOSIS — Z23 Encounter for immunization: Secondary | ICD-10-CM | POA: Diagnosis not present

## 2020-11-26 DIAGNOSIS — G8929 Other chronic pain: Secondary | ICD-10-CM | POA: Diagnosis present

## 2020-11-26 DIAGNOSIS — E8881 Metabolic syndrome: Secondary | ICD-10-CM

## 2020-11-26 DIAGNOSIS — M5442 Lumbago with sciatica, left side: Secondary | ICD-10-CM | POA: Diagnosis not present

## 2020-11-26 DIAGNOSIS — G4733 Obstructive sleep apnea (adult) (pediatric): Secondary | ICD-10-CM

## 2020-11-26 NOTE — Assessment & Plan Note (Signed)
Has started using CPAP and needs f/u appointment

## 2020-11-26 NOTE — Patient Instructions (Addendum)
F/U in 4 month, call if you need me before  Flu and TdAP today  Please schedule f/u with Dr Halford Chessman re sleep apnea at checkout  Please change snacks to fresh/ frozen vegetables or fruit,blood pressure, blood sugar and cholesterol are all high   Fasting lipid, cmp and EGFr, HBA1C 3 to 5 days before next visit  It is important that you exercise regularly at least 30 minutes 5 times a week. If you develop chest pain, have severe difficulty breathing, or feel very tired, stop exercising immediately and seek medical attention   Thanks for choosing [facility], we consider it a privelige to serve you.

## 2020-11-26 NOTE — Assessment & Plan Note (Signed)
The increased risk of cardiovascular disease associated with this diagnosis, and the need to consistently work on lifestyle to change this is discussed. Following  a  heart healthy diet ,commitment to 30 minutes of exercise at least 5 days per week, as well as control of blood sugar and cholesterol , and achieving a healthy weight are all the areas to be addressed .  

## 2020-11-26 NOTE — Assessment & Plan Note (Signed)
Deteriorated Patient educated about the importance of limiting  Carbohydrate intake , the need to commit to daily physical activity for a minimum of 30 minutes , and to commit weight loss. The fact that changes in all these areas will reduce or eliminate all together the development of diabetes is stressed.   Diabetic Labs Latest Ref Rng & Units 11/22/2020 04/26/2020 10/27/2019 03/30/2019 10/04/2018  HbA1c 4.8 - 5.6 % 6.3(H) 6.2(H) 6.2(H) 6.1(H) 6.2(H)  Chol 100 - 199 mg/dL 175 153 185 166 208(H)  HDL >39 mg/dL 44 46 53 49(L) 47(L)  Calc LDL 0 - 99 mg/dL 112(H) 91 117(H) 98 135(H)  Triglycerides 0 - 149 mg/dL 105 84 79 98 136  Creatinine 0.57 - 1.00 mg/dL 0.88 0.88 0.86 0.82 0.83   BP/Weight 11/26/2020 09/17/2020 07/23/2020 06/25/2020 02/07/2020 09/07/2019 4/71/2527  Systolic BP 129 290 903 014 996 924 932  Diastolic BP 94 82 92 66 75 72 67  Wt. (Lbs) 182 178 176.8 180 177.04 175 178.6  BMI 30.29 29.62 30.35 29.95 29.46 30.04 30.66   No flowsheet data found.

## 2020-11-26 NOTE — Assessment & Plan Note (Signed)
Re eval in 4 months, pt opting to change snacks  And reduce salt intake and work on weight loss No meds, first elevated reading in over 3 years DASH diet and commitment to daily physical activity for a minimum of 30 minutes discussed and encouraged, as a part of hypertension management. The importance of attaining a healthy weight is also discussed.  BP/Weight 11/26/2020 09/17/2020 07/23/2020 06/25/2020 02/07/2020 09/07/2019 07/13/1290  Systolic BP 909 030 149 969 249 324 199  Diastolic BP 94 82 92 66 75 72 67  Wt. (Lbs) 182 178 176.8 180 177.04 175 178.6  BMI 30.29 29.62 30.35 29.95 29.46 30.04 30.66

## 2020-11-26 NOTE — Assessment & Plan Note (Signed)
Hyperlipidemia:Low fat diet discussed and encouraged.   Lipid Panel  Lab Results  Component Value Date   CHOL 175 11/22/2020   HDL 44 11/22/2020   LDLCALC 112 (H) 11/22/2020   TRIG 105 11/22/2020   CHOLHDL 4.0 11/22/2020     Needs to lower fat intake, no med change

## 2020-11-26 NOTE — Progress Notes (Signed)
Kristin Blackwell     MRN: [id number]      DOB: 01/19/1951   HPI Ms. Zehring is here for follow up and re-evaluation of chronic medical conditions, medication management and review of any available recent lab and radiology data.  Preventive health is updated, specifically  Cancer screening and Immunization.   Questions or concerns regarding consultations or procedures which the PT has had in the interim are  addressed. The PT denies any adverse reactions to current medications since the last visit.  C/o LBP radiating down buttock and left leg x months, denies weakness, numbness or new incontinence Has been eating more junk, Is surprised that labs are not perfect since she is taking metamucil, understands and acknowledges the need to change food choice to improve health   ROS Denies recent fever or chills. Denies sinus pressure, nasal congestion, ear pain or sore throat. Denies chest congestion, productive cough or wheezing. Denies chest pains, palpitations and leg swelling Denies abdominal pain, nausea, vomiting,diarrhea or constipation.   Denies dysuria, frequency, hesitancy or incontinence.. Denies headaches, seizures, numbness, or tingling. Denies depression, anxiety or insomnia. Denies skin break down or rash.   PE  BP (!) 152/72   Pulse (!) 59   Resp 16   Ht 5\' 5"  (1.651 m)   Wt 182 lb (82.6 kg)   SpO2 98%   BMI 30.29 kg/m   Patient alert and oriented and in no cardiopulmonary distress.  HEENT: No facial asymmetry, EOMI,     Neck supple .  Chest: Clear to auscultation bilaterally.  CVS: S1, S2 no murmurs, no S3.Regular rate.  ABD: Soft non tender.   Ext: No edema  MS: decreased  ROM lumbar spine, adequate in shoulders, hips and knees.  Skin: Intact, no ulcerations or rash noted.  Psych: Good eye contact, normal affect. Memory intact not anxious or depressed appearing.  CNS: CN 2-12 intact, power,  normal throughout.no focal deficits  noted.   Assessment & Plan  Hyperlipidemia LDL goal <100 Hyperlipidemia:Low fat diet discussed and encouraged.   Lipid Panel  Lab Results  Component Value Date   CHOL 175 11/22/2020   HDL 44 11/22/2020   LDLCALC 112 (H) 11/22/2020   TRIG 105 11/22/2020   CHOLHDL 4.0 11/22/2020     Needs to lower fat intake, no med change  Prediabetes Deteriorated Patient educated about the importance of limiting  Carbohydrate intake , the need to commit to daily physical activity for a minimum of 30 minutes , and to commit weight loss. The fact that changes in all these areas will reduce or eliminate all together the development of diabetes is stressed.   Diabetic Labs Latest Ref Rng & Units 11/22/2020 04/26/2020 10/27/2019 03/30/2019 10/04/2018  HbA1c 4.8 - 5.6 % 6.3(H) 6.2(H) 6.2(H) 6.1(H) 6.2(H)  Chol 100 - 199 mg/dL 175 153 185 166 208(H)  HDL >39 mg/dL 44 46 53 49(L) 47(L)  Calc LDL 0 - 99 mg/dL 112(H) 91 117(H) 98 135(H)  Triglycerides 0 - 149 mg/dL 105 84 79 98 136  Creatinine 0.57 - 1.00 mg/dL 0.88 0.88 0.86 0.82 0.83   BP/Weight 11/26/2020 09/17/2020 07/23/2020 06/25/2020 02/07/2020 09/07/2019 05/25/8117  Systolic BP 147 829 562 130 865 784 696  Diastolic BP 94 82 92 66 75 72 67  Wt. (Lbs) 182 178 176.8 180 177.04 175 178.6  BMI 30.29 29.62 30.35 29.95 29.46 30.04 30.66   No flowsheet data found.    Obstructive sleep apnea Has started using CPAP and  needs f/u appointment  Metabolic syndrome X The increased risk of cardiovascular disease associated with this diagnosis, and the need to consistently work on lifestyle to change this is discussed. Following  a  heart healthy diet ,commitment to 30 minutes of exercise at least 5 days per week, as well as control of blood sugar and cholesterol , and achieving a healthy weight are all the areas to be addressed .   Elevated blood pressure reading without diagnosis of hypertension Re eval in 4 months, pt opting to change snacks  And reduce  salt intake and work on weight loss No meds, first elevated reading in over 3 years DASH diet and commitment to daily physical activity for a minimum of 30 minutes discussed and encouraged, as a part of hypertension management. The importance of attaining a healthy weight is also discussed.  BP/Weight 11/26/2020 09/17/2020 07/23/2020 06/25/2020 02/07/2020 09/07/2019 0/12/32  Systolic BP 961 164 353 912 258 346 219  Diastolic BP 94 82 92 66 75 72 67  Wt. (Lbs) 182 178 176.8 180 177.04 175 178.6  BMI 30.29 29.62 30.35 29.95 29.46 30.04 30.66

## 2021-01-24 LAB — HM MAMMOGRAPHY

## 2021-01-29 ENCOUNTER — Ambulatory Visit: Payer: BC Managed Care – PPO | Admitting: Podiatry

## 2021-01-29 ENCOUNTER — Encounter: Payer: Self-pay | Admitting: Podiatry

## 2021-01-29 ENCOUNTER — Ambulatory Visit (INDEPENDENT_AMBULATORY_CARE_PROVIDER_SITE_OTHER): Payer: BC Managed Care – PPO

## 2021-01-29 ENCOUNTER — Other Ambulatory Visit: Payer: Self-pay

## 2021-01-29 DIAGNOSIS — M779 Enthesopathy, unspecified: Secondary | ICD-10-CM

## 2021-01-29 DIAGNOSIS — M778 Other enthesopathies, not elsewhere classified: Secondary | ICD-10-CM

## 2021-01-29 MED ORDER — TRIAMCINOLONE ACETONIDE 10 MG/ML IJ SUSP
10.0000 mg | Freq: Once | INTRAMUSCULAR | Status: AC
  Administered 2021-01-29: 16:00:00 10 mg

## 2021-01-30 NOTE — Progress Notes (Signed)
Subjective:   Patient ID: Kristin Blackwell, female   DOB: 70 y.o.   MRN: [id number]   HPI Patient presents stating that the top of the left foot is bothersome and that the right joint is doing somewhat better and walking somewhat abnormal because the left foot   ROS      Objective:  Physical Exam  Neurovascular status intact with inflammation dorsal left foot more around the midtarsal joint with fluid buildup around the joint surfaces and possibly into the extensor tendon complex     Assessment:  Inflammatory condition with acute extensor tendinitis and midtarsal joint inflammation arthritis     Plan:  H&P reviewed condition and went ahead did sterile prep today injected around the midtarsal joint left and into the extensor digitorum complex 3 mg Dexasone Kenalog 5 mg Xylocaine making it a diffuse injection not directly into the tendons  X-rays indicate that there is some moderate midtarsal joint arthritis bilateral no indications of fracture no indications of acute condition

## 2021-02-03 ENCOUNTER — Other Ambulatory Visit: Payer: Self-pay

## 2021-02-03 ENCOUNTER — Ambulatory Visit: Payer: BC Managed Care – PPO | Admitting: Pulmonary Disease

## 2021-02-03 ENCOUNTER — Encounter: Payer: Self-pay | Admitting: Pulmonary Disease

## 2021-02-03 VITALS — BP 134/84 | HR 84 | Temp 98.4°F | Ht 64.0 in | Wt 183.1 lb

## 2021-02-03 DIAGNOSIS — G4733 Obstructive sleep apnea (adult) (pediatric): Secondary | ICD-10-CM | POA: Diagnosis not present

## 2021-02-03 NOTE — Patient Instructions (Signed)
Follow up in 1 year.

## 2021-02-03 NOTE — Progress Notes (Signed)
[facility]  Chief Complaint  Patient presents with   Follow-up    Feels Cpap is working well for most part but mask is bothering her.     Constitutional:  BP 134/84 (BP Location: Left Arm, Patient Position: Sitting)    Pulse 84    Temp 98.4 F (36.9 C)    Ht 5\' 4"  (1.626 m)    Wt 183 lb 1.3 oz (83 kg)    SpO2 97% Comment: ra   BMI 31.43 kg/m   Past Medical History:  HLD, Allergies, Asthma, Vit D deficiency  Past Surgical History:  She  has a past surgical history that includes Partial hysterectomy (1987).  Brief Summary:  Kristin Blackwell is a 70 y.o. female former smoker with obstructive sleep apnea.      Subjective:   She has been using CPAP nightly.  Has full face mask.  Sleeping better and not waking up as much at night.  Gets sinus congestion in the morning.  Had chest discomfort when she first started, but not an issue now.   Physical Exam:   Appearance - well kempt   ENMT - no sinus tenderness, no oral exudate, no LAN, Mallampati 3 airway, no stridor  Respiratory - equal breath sounds bilaterally, no wheezing or rales  CV - s1s2 regular rate and rhythm, no murmurs  Ext - no clubbing, no edema  Skin - no rashes  Psych - normal mood and affect   Sleep Tests:  HST 01/07/15 >> AHI 25.3, SpO2 low 77% HST 09/03/20 >> AHI 51.6, SpO2 low 72% Auto CPAP 01/01/21 to 01/30/21 >> used on 30 of 30 nights with average 7 hrs 2 min.  Average AHI 1.1 with median CPAP 8 and 95 th percentile CPAP 10 cm H2O  Social History:  She  reports that she quit smoking about 8 years ago. Her smoking use included cigarettes. She has a 30.00 pack-year smoking history. She has never used smokeless tobacco. She reports current alcohol use. She reports that she does not use drugs.  Family History:  Her family history includes Diabetes in her brother; Emphysema in her father; Hypertension in her mother and sister.     Assessment/Plan:    Obstructive sleep apnea. - she is compliant with CPAP and reports benefit from therapy - she uses [employer] for her DME - continue auto CPAP 5 to 15 cm H2O - she will check with her DME about getting new supplies  CPAP rhinitis. - prn flonase  Time Spent Involved in Patient Care on Day of Examination:  22 minutes  Follow up:   Patient Instructions  Follow up in 1 year  Medication List:   Allergies as of 02/03/2021       Reactions   Ibandronate Sodium    Lipitor [atorvastatin]    Muscle aches   Pravastatin    Muscle aches        Medication List        Accurate as of February 03, 2021  9:43 AM. If you have any questions, ask your nurse or doctor.          fluticasone 50 MCG/ACT nasal spray Commonly known as: FLONASE Place 2 sprays into both nostrils daily.   HAIR/SKIN/NAILS/BIOTIN PO Take 1 capsule by mouth daily.   montelukast 10 MG tablet Commonly known as: SINGULAIR Take 1 tablet (10 mg total) by mouth at bedtime.   psyllium 58.6 % packet Commonly known as: METAMUCIL  Take 1 packet by mouth daily.   rosuvastatin 10 MG tablet Commonly known as: CRESTOR TAKE ONE TABLET EVERY EVENING ON MONDAY, WEDNESDAY AND FRIDAY   Vitamin D 50 MCG (2000 UT) Caps Take 1 capsule by mouth daily.        Signature:  Chesley Mires, MD [place] Pager - [phone] 02/03/2021, 9:43 AM

## 2021-03-19 ENCOUNTER — Emergency Department (HOSPITAL_COMMUNITY): Payer: BC Managed Care – PPO

## 2021-03-19 ENCOUNTER — Observation Stay (HOSPITAL_COMMUNITY)
Admission: EM | Admit: 2021-03-19 | Discharge: 2021-03-20 | Disposition: A | Discharge status: Home / Self Care | Payer: BC Managed Care – PPO | Attending: Internal Medicine | Admitting: Internal Medicine

## 2021-03-19 ENCOUNTER — Observation Stay (HOSPITAL_BASED_OUTPATIENT_CLINIC_OR_DEPARTMENT_OTHER): Payer: BC Managed Care – PPO

## 2021-03-19 ENCOUNTER — Encounter (HOSPITAL_COMMUNITY): Payer: Self-pay

## 2021-03-19 ENCOUNTER — Other Ambulatory Visit: Payer: Self-pay

## 2021-03-19 DIAGNOSIS — R7303 Prediabetes: Secondary | ICD-10-CM | POA: Diagnosis not present

## 2021-03-19 DIAGNOSIS — Z79899 Other long term (current) drug therapy: Secondary | ICD-10-CM | POA: Insufficient documentation

## 2021-03-19 DIAGNOSIS — R531 Weakness: Secondary | ICD-10-CM | POA: Diagnosis not present

## 2021-03-19 DIAGNOSIS — G459 Transient cerebral ischemic attack, unspecified: Secondary | ICD-10-CM

## 2021-03-19 DIAGNOSIS — Z720 Tobacco use: Secondary | ICD-10-CM | POA: Diagnosis present

## 2021-03-19 DIAGNOSIS — Z20822 Contact with and (suspected) exposure to covid-19: Secondary | ICD-10-CM | POA: Diagnosis not present

## 2021-03-19 DIAGNOSIS — R200 Anesthesia of skin: Secondary | ICD-10-CM | POA: Diagnosis not present

## 2021-03-19 DIAGNOSIS — R030 Elevated blood-pressure reading, without diagnosis of hypertension: Secondary | ICD-10-CM | POA: Diagnosis not present

## 2021-03-19 DIAGNOSIS — Z87891 Personal history of nicotine dependence: Secondary | ICD-10-CM | POA: Diagnosis present

## 2021-03-19 DIAGNOSIS — E785 Hyperlipidemia, unspecified: Secondary | ICD-10-CM | POA: Diagnosis present

## 2021-03-19 DIAGNOSIS — E663 Overweight: Secondary | ICD-10-CM | POA: Insufficient documentation

## 2021-03-19 DIAGNOSIS — Z6830 Body mass index (BMI) 30.0-30.9, adult: Secondary | ICD-10-CM | POA: Diagnosis not present

## 2021-03-19 DIAGNOSIS — E66811 Obesity, class 1: Secondary | ICD-10-CM | POA: Diagnosis present

## 2021-03-19 DIAGNOSIS — R42 Dizziness and giddiness: Secondary | ICD-10-CM | POA: Diagnosis present

## 2021-03-19 DIAGNOSIS — K219 Gastro-esophageal reflux disease without esophagitis: Secondary | ICD-10-CM | POA: Diagnosis present

## 2021-03-19 LAB — RAPID URINE DRUG SCREEN, HOSP PERFORMED
Amphetamines: NOT DETECTED
Barbiturates: NOT DETECTED
Benzodiazepines: NOT DETECTED
Cocaine: NOT DETECTED
Opiates: NOT DETECTED
Tetrahydrocannabinol: NOT DETECTED

## 2021-03-19 LAB — CBC
HCT: 37.6 % (ref 36.0–46.0)
Hemoglobin: 12.1 g/dL (ref 12.0–15.0)
MCH: 27.8 pg (ref 26.0–34.0)
MCHC: 32.2 g/dL (ref 30.0–36.0)
MCV: 86.4 fL (ref 80.0–100.0)
Platelets: 291 10*3/uL (ref 150–400)
RBC: 4.35 MIL/uL (ref 3.87–5.11)
RDW: 15.2 % (ref 11.5–15.5)
WBC: 5.8 10*3/uL (ref 4.0–10.5)
nRBC: 0 % (ref 0.0–0.2)

## 2021-03-19 LAB — PROTIME-INR
INR: 1 (ref 0.8–1.2)
Prothrombin Time: 13.5 seconds (ref 11.4–15.2)

## 2021-03-19 LAB — URINALYSIS, ROUTINE W REFLEX MICROSCOPIC
Bilirubin Urine: NEGATIVE
Glucose, UA: NEGATIVE mg/dL
Hgb urine dipstick: NEGATIVE
Ketones, ur: NEGATIVE mg/dL
Leukocytes,Ua: NEGATIVE
Nitrite: NEGATIVE
Protein, ur: NEGATIVE mg/dL
Specific Gravity, Urine: 1.015 (ref 1.005–1.030)
pH: 6 (ref 5.0–8.0)

## 2021-03-19 LAB — COMPREHENSIVE METABOLIC PANEL
ALT: 21 U/L (ref 0–44)
AST: 26 U/L (ref 15–41)
Albumin: 3.7 g/dL (ref 3.5–5.0)
Alkaline Phosphatase: 68 U/L (ref 38–126)
Anion gap: 5 (ref 5–15)
BUN: 13 mg/dL (ref 8–23)
CO2: 25 mmol/L (ref 22–32)
Calcium: 8.7 mg/dL — ABNORMAL LOW (ref 8.9–10.3)
Chloride: 106 mmol/L (ref 98–111)
Creatinine, Ser: 0.77 mg/dL (ref 0.44–1.00)
GFR, Estimated: >60 mL/min (ref >60)
Glucose, Bld: 114 mg/dL — ABNORMAL HIGH (ref 70–99)
Potassium: 3.7 mmol/L (ref 3.5–5.1)
Sodium: 136 mmol/L (ref 135–145)
Total Bilirubin: 0.2 mg/dL — ABNORMAL LOW (ref 0.3–1.2)
Total Protein: 7.4 g/dL (ref 6.5–8.1)

## 2021-03-19 LAB — ETHANOL: Alcohol, Ethyl (B): <10 mg/dL (ref <10)

## 2021-03-19 LAB — I-STAT CHEM 8, ED
BUN: 12 mg/dL (ref 8–23)
Calcium, Ion: 1.23 mmol/L (ref 1.15–1.40)
Chloride: 105 mmol/L (ref 98–111)
Creatinine, Ser: 0.8 mg/dL (ref 0.44–1.00)
Glucose, Bld: 105 mg/dL — ABNORMAL HIGH (ref 70–99)
HCT: 38 % (ref 36.0–46.0)
Hemoglobin: 12.9 g/dL (ref 12.0–15.0)
Potassium: 3.8 mmol/L (ref 3.5–5.1)
Sodium: 142 mmol/L (ref 135–145)
TCO2: 26 mmol/L (ref 22–32)

## 2021-03-19 LAB — TSH: TSH: 1.673 u[IU]/mL (ref 0.350–4.500)

## 2021-03-19 LAB — LIPID PANEL
Cholesterol: 149 mg/dL (ref 0–200)
HDL: 46 mg/dL (ref >40)
LDL Cholesterol: 89 mg/dL (ref 0–99)
Total CHOL/HDL Ratio: 3.2 RATIO
Triglycerides: 72 mg/dL (ref <150)
VLDL: 14 mg/dL (ref 0–40)

## 2021-03-19 LAB — DIFFERENTIAL
Abs Immature Granulocytes: 0.02 10*3/uL (ref 0.00–0.07)
Basophils Absolute: 0 10*3/uL (ref 0.0–0.1)
Basophils Relative: 1 %
Eosinophils Absolute: 0.1 10*3/uL (ref 0.0–0.5)
Eosinophils Relative: 1 %
Immature Granulocytes: 0 %
Lymphocytes Relative: 44 %
Lymphs Abs: 2.5 10*3/uL (ref 0.7–4.0)
Monocytes Absolute: 0.4 10*3/uL (ref 0.1–1.0)
Monocytes Relative: 7 %
Neutro Abs: 2.7 10*3/uL (ref 1.7–7.7)
Neutrophils Relative %: 47 %

## 2021-03-19 LAB — ECHOCARDIOGRAM COMPLETE
AR max vel: 1.74 cm2
AV Area VTI: 1.84 cm2
AV Area mean vel: 2.07 cm2
AV Mean grad: 5 mmHg
AV Peak grad: 10.9 mmHg
Ao pk vel: 1.65 m/s
Area-P 1/2: 3.12 cm2
Height: 64 in
S' Lateral: 3 cm
Weight: 2880 oz

## 2021-03-19 LAB — CBG MONITORING, ED: Glucose-Capillary: 103 mg/dL — ABNORMAL HIGH (ref 70–99)

## 2021-03-19 LAB — HIV ANTIBODY (ROUTINE TESTING W REFLEX): HIV Screen 4th Generation wRfx: NONREACTIVE

## 2021-03-19 LAB — RESP PANEL BY RT-PCR (FLU A&B, COVID) ARPGX2
Influenza A by PCR: NEGATIVE
Influenza B by PCR: NEGATIVE
SARS Coronavirus 2 by RT PCR: NEGATIVE

## 2021-03-19 LAB — VITAMIN B12: Vitamin B-12: 171 pg/mL — ABNORMAL LOW (ref 180–914)

## 2021-03-19 LAB — APTT: aPTT: 29 seconds (ref 24–36)

## 2021-03-19 LAB — VITAMIN D 25 HYDROXY (VIT D DEFICIENCY, FRACTURES): Vit D, 25-Hydroxy: 38.38 ng/mL (ref 30–100)

## 2021-03-19 MED ORDER — PERFLUTREN LIPID MICROSPHERE
1.0000 mL | INTRAVENOUS | Status: AC | PRN
  Administered 2021-03-19: 2 mL via INTRAVENOUS

## 2021-03-19 MED ORDER — ENOXAPARIN SODIUM 40 MG/0.4ML IJ SOSY
40.0000 mg | PREFILLED_SYRINGE | INTRAMUSCULAR | Status: DC
  Administered 2021-03-19: 40 mg via SUBCUTANEOUS
  Filled 2021-03-19 (×2): qty 0.4

## 2021-03-19 MED ORDER — CYANOCOBALAMIN 1000 MCG/ML IJ SOLN
1000.0000 ug | Freq: Once | INTRAMUSCULAR | Status: AC
  Administered 2021-03-19: 1000 ug via INTRAMUSCULAR
  Filled 2021-03-19: qty 1

## 2021-03-19 MED ORDER — ACETAMINOPHEN 325 MG PO TABS: 650.0000 mg | ORAL_TABLET | ORAL | Status: DC | PRN

## 2021-03-19 MED ORDER — ROSUVASTATIN CALCIUM 10 MG PO TABS
10.0000 mg | ORAL_TABLET | Freq: Every day | ORAL | Status: DC
  Administered 2021-03-19: 10 mg via ORAL
  Filled 2021-03-19: qty 1

## 2021-03-19 MED ORDER — STROKE: EARLY STAGES OF RECOVERY BOOK
Freq: Once | Status: AC
  Filled 2021-03-19: qty 1

## 2021-03-19 MED ORDER — ACETAMINOPHEN 650 MG RE SUPP: 650.0000 mg | RECTAL | Status: DC | PRN

## 2021-03-19 MED ORDER — ASPIRIN 81 MG PO CHEW
81.0000 mg | CHEWABLE_TABLET | Freq: Once | ORAL | Status: AC
  Administered 2021-03-19: 81 mg via ORAL
  Filled 2021-03-19: qty 1

## 2021-03-19 MED ORDER — ASPIRIN EC 81 MG PO TBEC
81.0000 mg | DELAYED_RELEASE_TABLET | Freq: Every day | ORAL | Status: DC
  Administered 2021-03-20: 81 mg via ORAL
  Filled 2021-03-19: qty 1

## 2021-03-19 MED ORDER — PANTOPRAZOLE SODIUM 40 MG PO TBEC
40.0000 mg | DELAYED_RELEASE_TABLET | Freq: Every day | ORAL | Status: DC
  Administered 2021-03-20: 40 mg via ORAL
  Filled 2021-03-19 (×2): qty 1

## 2021-03-19 MED ORDER — NICOTINE 14 MG/24HR TD PT24
14.0000 mg | MEDICATED_PATCH | Freq: Every day | TRANSDERMAL | Status: DC
  Filled 2021-03-19: qty 1

## 2021-03-19 MED ORDER — SENNOSIDES-DOCUSATE SODIUM 8.6-50 MG PO TABS: 1.0000 | ORAL_TABLET | Freq: Every evening | ORAL | Status: DC | PRN

## 2021-03-19 MED ORDER — ACETAMINOPHEN 160 MG/5ML PO SOLN: 650.0000 mg | ORAL | Status: DC | PRN

## 2021-03-19 MED ORDER — CLOPIDOGREL BISULFATE 75 MG PO TABS
75.0000 mg | ORAL_TABLET | Freq: Every day | ORAL | Status: DC
  Administered 2021-03-19 – 2021-03-20 (×2): 75 mg via ORAL
  Filled 2021-03-19 (×2): qty 1

## 2021-03-19 MED ORDER — SODIUM CHLORIDE 0.9 % IV SOLN: INTRAVENOUS | Status: AC

## 2021-03-19 MED ORDER — IOHEXOL 350 MG/ML SOLN
100.0000 mL | Freq: Once | INTRAVENOUS | Status: AC | PRN
  Administered 2021-03-19: 75 mL via INTRAVENOUS

## 2021-03-19 NOTE — Progress Notes (Signed)
Patient declined CPAP. No unit in room at this time. °

## 2021-03-19 NOTE — ED Provider Notes (Signed)
[facility] EMERGENCY DEPARTMENT Provider Note   CSN: [id number] Arrival date & time: 03/19/21  3875  An emergency department physician performed an initial assessment on this suspected stroke patient at 71.  History  Chief Complaint  Patient presents with   Dizziness    Kristin Blackwell is a 71 y.o. female.   Dizziness Associated symptoms: weakness (Generalized)   Associated symptoms: no headaches   Patient presents for generalized weakness.  Medical history is notable for HLD, LBBB, prediabetes, GERD.  She states that she was in her normal state of health up until this morning.  She was laying in bed when the alarm clock went off.  She was able to go to the bathroom and come back to the bed to lay back down.  She felt normal at that time.  When her snooze alarm went off again, she rolled over to the right side and felt what she describes as a funny feeling in her head.  She denies any pain that was associated with this funny feeling she did not feel numb in her scalp.  She has a difficult time describing the sensation.  She felt like she had some blurriness in her vision.  She also felt that she had generalized weakness and it was very difficult for her to get out of bed.  She continued to lay there and was ultimately able to feel more normal and get up out of bed and come to the hospital.  She came in because she had concern of a stroke.  Currently, she has a continued "funny feeling" in her head.  Her vision has returned to normal.  She does continue to feel generalized weakness, although improved from earlier.    Home Medications Prior to Admission medications   Medication Sig Start Date End Date Taking? Authorizing Provider  Cholecalciferol (VITAMIN D) 50 MCG (2000 UT) CAPS Take 1 capsule by mouth daily.   Yes [provider]  Multiple Vitamins-Minerals (HAIR/SKIN/NAILS/BIOTIN PO) Take 1 capsule by mouth daily.   Yes [provider]  rosuvastatin (CRESTOR) 10 MG  tablet TAKE ONE TABLET EVERY EVENING ON Adelfa [facility] AND FRIDAY 11/11/20  Yes Fayrene Helper, MD  zinc gluconate 50 MG tablet Take 50 mg by mouth daily.   Yes [provider]      Allergies    Ibandronate sodium, Lipitor [atorvastatin], and Pravastatin    Review of Systems   Review of Systems  Neurological:  Positive for dizziness and weakness (Generalized). Negative for syncope, speech difficulty and headaches.  Hematological:  Does not bruise/bleed easily.  All other systems reviewed and are negative.  Physical Exam Updated Vital Signs BP (!) 153/78    Pulse (!) 55    Temp 98.5 F (36.9 C)    Resp 18    Ht 5\' 4"  (1.626 m)    Wt 81.6 kg    SpO2 100%    BMI 30.90 kg/m  Physical Exam Vitals and nursing note reviewed.  Constitutional:      General: She is not in acute distress.    Appearance: Normal appearance. She is well-developed and normal weight. She is not ill-appearing, toxic-appearing or diaphoretic.  HENT:     Head: Normocephalic and atraumatic.     Right Ear: External ear normal.     Left Ear: External ear normal.     Nose: Nose normal.     Mouth/Throat:     Mouth: Mucous membranes are moist.     Pharynx: Oropharynx  is clear.  Eyes:     General: No visual field deficit or scleral icterus.    Extraocular Movements: Extraocular movements intact.     Conjunctiva/sclera: Conjunctivae normal.  Cardiovascular:     Rate and Rhythm: Normal rate and regular rhythm.     Heart sounds: No murmur heard. Pulmonary:     Effort: Pulmonary effort is normal. No respiratory distress.  Abdominal:     Palpations: Abdomen is soft.     Tenderness: There is no abdominal tenderness.  Musculoskeletal:        General: No swelling. Normal range of motion.     Cervical back: Normal range of motion and neck supple. No rigidity.     Right lower leg: No edema.     Left lower leg: No edema.  Skin:    General: Skin is warm and dry.     Capillary Refill: Capillary refill  takes less than 2 seconds.     Coloration: Skin is not jaundiced or pale.  Neurological:     Mental Status: She is alert.     Cranial Nerves: Cranial nerves 2-12 are intact. No cranial nerve deficit, dysarthria or facial asymmetry.     Sensory: Sensory deficit (Endorses slightly diminished sensation to left upper and lower extremities) present.     Motor: Weakness (Slightly diminished strength in left lower extremity) present. No abnormal muscle tone or pronator drift.     Coordination: Coordination is intact. Finger-Nose-Finger Test normal.  Psychiatric:        Mood and Affect: Mood normal.    ED Results / Procedures / Treatments   Labs (all labs ordered are listed, but only abnormal results are displayed) Labs Reviewed  COMPREHENSIVE METABOLIC PANEL - Abnormal; Notable for the following components:      Result Value   Glucose, Bld 114 (*)    Calcium 8.7 (*)    Total Bilirubin 0.2 (*)    All other components within normal limits  CBG MONITORING, ED - Abnormal; Notable for the following components:   Glucose-Capillary 103 (*)    All other components within normal limits  I-STAT CHEM 8, ED - Abnormal; Notable for the following components:   Glucose, Bld 105 (*)    All other components within normal limits  RESP PANEL BY RT-PCR (FLU A&B, COVID) ARPGX2  ETHANOL  PROTIME-INR  APTT  CBC  DIFFERENTIAL  RAPID URINE DRUG SCREEN, HOSP PERFORMED  URINALYSIS, ROUTINE W REFLEX MICROSCOPIC  LIPID PANEL  HEMOGLOBIN A1C  HIV ANTIBODY (ROUTINE TESTING W REFLEX)  TSH  VITAMIN B12  VITAMIN D 25 HYDROXY (VIT D DEFICIENCY, FRACTURES)    EKG None  Radiology MR BRAIN WO CONTRAST  Result Date: 03/19/2021 CLINICAL DATA:  Neuro deficit, acute, stroke suspected EXAM: MRI HEAD WITHOUT CONTRAST TECHNIQUE: Multiplanar, multiecho pulse sequences of the brain and surrounding structures were obtained without intravenous contrast. COMPARISON:  Same day CT. FINDINGS: Brain: No acute infarction,  hemorrhage, hydrocephalus, extra-axial collection or mass lesion. Vascular: Major arterial flow voids are maintained skull base. Skull and upper cervical spine: Normal marrow signal. Sinuses/Orbits: Clear sinuses.  Unremarkable orbits. Other: No mastoid effusions. IMPRESSION: No evidence of acute abnormality. Electronically Signed   By: Margaretha Sheffield M.D.   On: 03/19/2021 11:21   CT HEAD CODE STROKE WO CONTRAST  Result Date: 03/19/2021 CLINICAL DATA:  Code stroke. Neuro deficit, acute, stroke suspected; left-sided weakness EXAM: CT HEAD WITHOUT CONTRAST TECHNIQUE: Contiguous axial images were obtained from the base of the skull through the  vertex without intravenous contrast. RADIATION DOSE REDUCTION: This exam was performed according to the departmental dose-optimization program which includes automated exposure control, adjustment of the mA and/or kV according to patient size and/or use of iterative reconstruction technique. COMPARISON:  None. FINDINGS: Brain: No acute intracranial hemorrhage, mass effect, or edema. Gray-white differentiation is preserved. Ventricles and sulci are normal in size and configuration. No extra-axial collection. Vascular: No hyperdense vessel. Skull: Unremarkable. Sinuses/Orbits: No acute abnormality. Other: Mastoid air cells are clear. ASPECTS ([place] Stroke Program Early CT Score) - Ganglionic level infarction (caudate, lentiform nuclei, internal capsule, insula, M1-M3 cortex): 7 - Supraganglionic infarction (M4-M6 cortex): 3 Total score (0-10 with 10 being normal): 10 IMPRESSION: There is no acute intracranial hemorrhage or evidence of acute infarction. ASPECT score is 10. These results were called by telephone at the time of interpretation on 03/19/2021 at 8:55 am to provider Godfrey Pick , who verbally acknowledged these results. Electronically Signed   By: Macy Mis M.D.   On: 03/19/2021 08:56   CT ANGIO NECK CODE STROKE  Result Date: 03/19/2021 CLINICAL DATA:   71 year old female with left side weakness. Neurologic deficit. EXAM: CT ANGIOGRAPHY NECK TECHNIQUE: Multidetector CT imaging of the neck was performed using the standard protocol during bolus administration of intravenous contrast. Multiplanar CT image reconstructions and MIPs were obtained to evaluate the vascular anatomy. Carotid stenosis measurements (when applicable) are obtained utilizing NASCET criteria, using the distal internal carotid diameter as the denominator. RADIATION DOSE REDUCTION: This exam was performed according to the departmental dose-optimization program which includes automated exposure control, adjustment of the mA and/or kV according to patient size and/or use of iterative reconstruction technique. CONTRAST:  53mL OMNIPAQUE IOHEXOL 350 MG/ML SOLN COMPARISON:  Head CT 0851 hours today. 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 No acute osseous abnormality identified. Upper chest: Negative. Other neck: The glottis is closed. Otherwise negative neck soft tissues. Aortic arch: 3 vessel arch configuration with no arch atherosclerosis. Generalized tortuosity of the proximal great vessels. Right carotid system: Tortuous brachiocephalic artery and right CCA origin without plaque or stenosis. Negative right CCA and right carotid bifurcation. Mildly tortuous right ICA below the skull base without plaque or stenosis. Visible right ICA siphon is patent. Left carotid system: Mild tortuosity. Negative left CCA. Minimal soft and calcified plaque at the left ICA bulb without stenosis. Visible left ICA siphon is patent. Vertebral arteries: Tortuous proximal right subclavian artery and normal right vertebral artery origin with no plaque or stenosis. Right vertebral artery is tortuous in the neck but patent to the vertebrobasilar junction without plaque or stenosis. Normal right PICA origin. Visible proximal basilar artery is patent. Mildly tortuous proximal left subclavian  artery and normal left vertebral artery origin. Mild V1 segment tortuosity. Codominant left vertebral artery is patent to the vertebrobasilar junction without plaque or stenosis. Anatomic variants: None. Review of the MIP images confirms the above findings IMPRESSION: 1. Generalized arterial tortuosity with minimal atherosclerosis in the neck. No extracranial arterial stenosis. 2. 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 Electronically Signed   By: Genevie Ann M.D.   On: 03/19/2021 10:40    Procedures Procedures    Medications Ordered in ED Medications  clopidogrel (PLAVIX) tablet 75 mg (75 mg Oral Given 03/19/21 1008)  aspirin EC tablet 81 mg (has no administration in time range)  nicotine (NICODERM CQ - dosed in mg/24 hours) patch 14 mg (has no administration in time range)   stroke: mapping our early stages of recovery  book (has no administration in time range)  0.9 %  sodium chloride infusion (has no administration in time range)  acetaminophen (TYLENOL) tablet 650 mg (has no administration in time range)    Or  acetaminophen (TYLENOL) 160 MG/5ML solution 650 mg (has no administration in time range)    Or  acetaminophen (TYLENOL) suppository 650 mg (has no administration in time range)  senna-docusate (Senokot-S) tablet 1 tablet (has no administration in time range)  enoxaparin (LOVENOX) injection 40 mg (has no administration in time range)  pantoprazole (PROTONIX) EC tablet 40 mg (has no administration in time range)  aspirin chewable tablet 81 mg (81 mg Oral Given 03/19/21 1008)  iohexol (OMNIPAQUE) 350 MG/ML injection 100 mL (75 mLs Intravenous Contrast Given 03/19/21 1027)    ED Course/ Medical Decision Making/ A&P                           Medical Decision Making Amount and/or Complexity of Data Reviewed Labs: ordered. Radiology: ordered.  Risk OTC drugs. Prescription drug management. Decision regarding hospitalization.   This patient presents to the ED for  concern of dizziness and generalized weakness, this involves an extensive number of treatment options, and is a complaint that carries with it a high risk of complications and morbidity.  The differential diagnosis includes CVA, polypharmacy, electrolyte abnormalities, anemia   Co morbidities that complicate the patient evaluation  HLD, LBBB, prediabetes, GERD   Additional history obtained:  Additional history obtained from N/A External records from outside source obtained and reviewed including EMR   Lab Tests:  I Ordered, and personally interpreted labs.  The pertinent results include: No abnormal findings   Imaging Studies ordered:  I ordered imaging studies including CT head I independently visualized and interpreted imaging which showed no acute findings I agree with the radiologist interpretation   Cardiac Monitoring:  The patient was maintained on a cardiac monitor.  I personally viewed and interpreted the cardiac monitored which showed an underlying rhythm of: Sinus rhythm   Medicines ordered and prescription drug management:  I ordered medication including ASA and Plavix for possible stroke Reevaluation of the patient after these medicines showed that the patient stayed the same I have reviewed the patients home medicines and have made adjustments as needed  Critical Interventions:  Initiation of code stroke for left hemibody weakness and diminished sensation   Consultations Obtained:  I requested consultation with the neurologist,  and discussed lab and imaging findings as well as pertinent plan - they recommend: Admission for stroke/TIA work-up   Problem List / ED Course:  Healthy 71 year old female presenting for cute onset of generalized weakness and a "funny" sensation in her head.  This occurred this morning, at around 6:30 AM.  She felt normal prior to this.  Patient presents to the ED with vital signs that are notable for moderate hypertension.  On  exam, she is alert and oriented.  She has no dysarthria.  She does appear to have generalized weakness which is slightly greater on the left side.  She also endorses diminished sensation to her left hemibody.  Code stroke was initiated.  Noncontrasted CT scan of head was negative for acute findings.  Patient was evaluated by neurology telehealth.  At that time, she had NIHSS of 1.  Recommendations by neurology are for admission for further TIA/stroke work-up.  Patient's lab work results are unremarkable.  Patient was admitted to hospitalist for further management.   Reevaluation:  After the interventions noted above, I reevaluated the patient and found that they have :improved   Social Determinants of Health:  Patient is strong family support and good underlying health.     Dispostion:  After consideration of the diagnostic results and the patients response to treatment, I feel that the patent would benefit from Admission to hospital.          Final Clinical Impression(s) / ED Diagnoses Final diagnoses:  Dizziness  Generalized weakness    Rx / DC Orders ED Discharge Orders     None         Godfrey Pick, MD 03/19/21 1124

## 2021-03-19 NOTE — Assessment & Plan Note (Addendum)
-  prior hx of elevated LDL, patient was on three times a week statins, which will be continue. -Llipid panel demonstrating LDL of 89 and total cholesterol of 149. -goal is for LDL < 100; continue to follow lipid panel as an outpatient and further reduce his statins as needed.

## 2021-03-19 NOTE — Assessment & Plan Note (Addendum)
-  Patient was allowed for permissive hypertension while ruling out ischemia. -Blood pressure has a stabilized inside the hospital without the use of any medications. -Patient expressed being told in the past had borderline hypertension. -She would like to pursuit lifestyle modification changes and agrees during 102-month.  Blood pressure is not at goal she will take medications for it. -She will follow closely with her PCP as an outpatient. -Continue heart healthy/low-sodium diet at discharge.

## 2021-03-19 NOTE — Assessment & Plan Note (Addendum)
-  lifestyle changes discussed with patient -started on protonix given initiation of dual antiplatelet therapy for GI prophylaxis  -Recommending to stop the use of PPI after 20 more days of GI prophylaxis. -Patient advised to maintain adequate hydration.

## 2021-03-19 NOTE — Consult Note (Signed)
NEUROLOGY TELECONSULTATION NOTE   Date of service: March 19, 2021 Patient Name: Kristin Blackwell MRN:  [id number] DOB:  1950-12-02 Reason for consult: telestroke  Requesting Provider: Dr. Godfrey Pick Consult Participants: myself, patient, bedside RN, telestroke RN Location of the provider: [facility] Location of the patient: Kristin Blackwell  This consult was provided via telemedicine with 2-way video and audio communication. The patient/family was informed that care would be provided in this way and agreed to receive care in this manner.   _ _ _   _ __   _ __ _ _  __ __   _ __   __ _  History of Present Illness   This is a 71 yo woman with hx HL and hx tobacco abuse who presented in private vehicle this AM to [facility] after an episode of dizziness this AM. LKW 0600 when she woke up and went to the bathroom. When she laid back down she felt that room was spinning and this lasted approx 15-20 min. She is still having some lightheadedness and also some L sided sensory deficit. CT head NAICP (personal review). NIHSS = 1 for sensory deficit. TNK not administered 2/2 mild sx. She is not on anticoagulation.    ROS   Per HPI; all other systems reviewed and are negative  Past History   The following was personally reviewed:  Past Medical History:  Diagnosis Date   Hyperlipidemia    Nicotine addiction    Overweight(278.02)    Past Surgical History:  Procedure Laterality Date   PARTIAL HYSTERECTOMY  1987   had one ovary removed    Family History  Problem Relation Age of Onset   Hypertension Mother    Emphysema Father    Hypertension Sister    Diabetes Brother        oldest    Social History   Socioeconomic History   Marital status: Widowed    Spouse name: Not on file   Number of children: Not on file   Years of education: Not on file   Highest education level: Not on file  Occupational History   Occupation: Licensed conveyancer at [employer]   Tobacco Use   Smoking status: Former     Packs/day: 1.00    Years: 30.00    Pack years: 30.00    Types: Cigarettes    Quit date: 2014    Years since quitting: 9.0   Smokeless tobacco: Never  Vaping Use   Vaping Use: Never used  Substance and Sexual Activity   Alcohol use: Yes    Comment: occassionally   Drug use: No   Sexual activity: Not on file  Other Topics Concern   Not on file  Social History Narrative   Cell [phone]   Social Determinants of Health   Financial Resource Strain: Not on file  Food Insecurity: Not on file  Transportation Needs: Not on file  Physical Activity: Not on file  Stress: Not on file  Social Connections: Not on file   Allergies  Allergen Reactions   Ibandronate Sodium    Lipitor [Atorvastatin]     Muscle aches   Pravastatin     Muscle aches    Medications   (Not in a hospital admission)    No current facility-administered medications for this encounter.  Current Outpatient Medications:    Cholecalciferol (VITAMIN D) 50 MCG (2000 UT) CAPS, Take 1 capsule by mouth daily., Disp: , Rfl:    fluticasone (FLONASE) 50 MCG/ACT nasal spray, Place 2  sprays into both nostrils daily., Disp: 16 g, Rfl: 6   montelukast (SINGULAIR) 10 MG tablet, Take 1 tablet (10 mg total) by mouth at bedtime., Disp: 30 tablet, Rfl: 3   Multiple Vitamins-Minerals (HAIR/SKIN/NAILS/BIOTIN PO), Take 1 capsule by mouth daily., Disp: , Rfl:    psyllium (METAMUCIL) 58.6 % packet, Take 1 packet by mouth daily., Disp: , Rfl:    rosuvastatin (CRESTOR) 10 MG tablet, TAKE ONE TABLET EVERY EVENING ON MONDAY, [facility] AND FRIDAY, Disp: 36 tablet, Rfl: 4  Vitals   Vitals:   03/19/21 0820 03/19/21 0824 03/19/21 0850 03/19/21 0900  BP:  (!) 157/70 (!) 155/82 (!) 153/86  Pulse:  60 71 (!) 57  Resp:  20 17 (!) 21  Temp:  98.7 F (37.1 C)    TempSrc:  Oral    SpO2:  98% 94% 97%  Weight: 81.6 kg     Height: 5\' 4"  (1.626 m)        Body mass index is 30.9 kg/m.  Physical Exam   Exam performed over telemedicine  with 2-way video and audio communication and with assistance of bedside RN  Physical Exam Gen: A&O x4, NAD Resp: normal WOB CV: extremities appear well-perfused  Neuro: *MS: A&O x4. Follows multi-step commands.  *Speech: nondysarthric, no aphasia, able to name and repeat *CN: PERRL 88mm, EOMI, VFF by confrontation, sensation impaired to L side only, smile symmetric, hearing intact to voice *Motor:   Normal bulk.  No tremor, rigidity or bradykinesia. No pronator drift. All extremities appear full-strength and symmetric. *Sensory: SILT. Symmetric. No double-simultaneous extinction.  *Coordination:  Finger-to-nose, heel-to-shin, rapid alternating motions were intact. *Reflexes:  UTA 2/2 tele-exam *Gait: deferred  NIHSS = 1 for sensory deficit   Premorbid mRS = 0   Labs   CBC:  Recent Labs  Lab 03/19/21 0841  WBC 5.8  NEUTROABS 2.7  HGB 12.1  HCT 37.6  MCV 86.4  PLT 867    Basic Metabolic Panel:  Lab Results  Component Value Date   Blackwell 136 03/19/2021   K 3.7 03/19/2021   CO2 25 03/19/2021   GLUCOSE 114 (H) 03/19/2021   BUN 13 03/19/2021   CREATININE 0.77 03/19/2021   CALCIUM 8.7 (L) 03/19/2021   GFRNONAA >60 03/19/2021   GFRAA 80 10/27/2019   Lipid Panel:  Lab Results  Component Value Date   LDLCALC 112 (H) 11/22/2020   HgbA1c:  Lab Results  Component Value Date   HGBA1C 6.3 (H) 11/22/2020   Urine Drug Screen: No results found for: LABOPIA, COCAINSCRNUR, LABBENZ, AMPHETMU, THCU, LABBARB  Alcohol Level     Component Value Date/Time   [facility] <10 03/19/2021 0841     Impression   This is a 71 yo woman with hx HL and hx tobacco abuse who presented in private vehicle this AM to [facility] after developing dizziness this AM around 0600. CT head NAICP. TNK not administered 2/2 mild sx.  Recommendations   - Admit to hospitalist service for stroke w/u. I will be available for questions by pager today; if she requires further f/u after today please consult Dr. Hortense Ramal  with teleneurology.  - Permissive HTN x48 hrs from sx onset or until stroke ruled out by MRI goal BP <220/110. PRN labetalol or hydralazine if BP above these parameters. Avoid oral antihypertensives. - MRI brain wo contrast - CTA or MRA H&N - TTE  - Check A1c and LDL + add statin per guidelines - ASA 81mg  daily + plavix 75mg  daily x21 days  f/b ASA 81mg  daily monotherapy after that - q4 hr neuro checks - STAT head CT for any change in neuro exam - Tele - PT/OT/SLP - Stroke education - Amb referral to neurology upon discharge   D/w Dr. Doren Custard by phone ______________________________________________________________________   Thank you for the opportunity to take part in the care of this patient. If you have any further questions, please contact the neurology consultation attending.  Signed,  Su Monks, MD [facility] [phone]  If 7pm- 7am, please page neurology on call as listed in [place].

## 2021-03-19 NOTE — H&P (Signed)
History and Physical    Patient: Kristin Blackwell SHF:[id number] DOB: December 24, 1950 DOA: 03/19/2021 DOS: the patient was seen and examined on 03/19/2021 PCP: Fayrene Helper, MD  Patient coming from: Home  Chief Complaint:  Chief Complaint  Patient presents with   Dizziness    HPI: Kristin Blackwell is a 71 y.o. female with medical history significant of hyperlipidemia, prediabetes, borderline hypertension, overweight and prior history of tobacco abuse; who presented to the emergency department secondary to dizziness and left-sided weakness after waking up earlier this morning.  Patient reports partial improvement in her symptoms by the time of arrival to the emergency department.  But expressed still ongoing intermittent spinning sensation especially when she lays down flat.  Patient also reporting some numbness on her left side; normal strength appreciated.  Patient denies chest pain, nausea, vomiting, dysuria, shortness of breath, fever, hematuria, melena, hematochezia, diarrhea, abdominal pain, sick contacts or any other complaints.  ED course: CT head demonstrating no acute intracranial abnormalities; patient did not receive tPA secondary to mild symptoms and already ongoing improvement.  Case discussed with teleneurology with recommendation for admission to complete TIA/stroke work-up.  Review of Systems: As mentioned in the history of present illness. All other systems reviewed and are negative. Past Medical History:  Diagnosis Date   Hyperlipidemia    Nicotine addiction    Overweight(278.02)    Past Surgical History:  Procedure Laterality Date   PARTIAL HYSTERECTOMY  1987   had one ovary removed    Social History:  reports that she quit smoking about 9 years ago. Her smoking use included cigarettes. She has a 30.00 pack-year smoking history. She has never used smokeless tobacco. She reports current alcohol use. She reports that she does not use drugs.  Allergies  Allergen  Reactions   Ibandronate Sodium    Lipitor [Atorvastatin]     Muscle aches   Pravastatin     Muscle aches    Family History  Problem Relation Age of Onset   Hypertension Mother    Emphysema Father    Hypertension Sister    Diabetes Brother        oldest     Prior to Admission medications   Medication Sig Start Date End Date Taking? Authorizing Provider  Cholecalciferol (VITAMIN D) 50 MCG (2000 UT) CAPS Take 1 capsule by mouth daily.   Yes [provider]  Multiple Vitamins-Minerals (HAIR/SKIN/NAILS/BIOTIN PO) Take 1 capsule by mouth daily.   Yes [provider]  rosuvastatin (CRESTOR) 10 MG tablet TAKE ONE TABLET EVERY EVENING ON Adelfa Koh [facility] AND FRIDAY 11/11/20  Yes Fayrene Helper, MD  zinc gluconate 50 MG tablet Take 50 mg by mouth daily.   Yes [provider]    Physical Exam: Vitals:   03/19/21 0850 03/19/21 0900 03/19/21 0934 03/19/21 1000  BP: (!) 155/82 (!) 153/86 135/75 139/75  Pulse: 71 (!) 57 (!) 56 (!) 55  Resp: 17 (!) 21 17 10   Temp:      TempSrc:      SpO2: 94% 97% 100% 100%  Weight:      Height:       General exam: Alert, awake, oriented x 3, no icterus, no nystagmus. Respiratory system: Clear to auscultation. Respiratory effort normal. Cardiovascular system:RRR. No murmurs, rubs, gallops.No JVD Gastrointestinal system: Abdomen is nondistended, soft and nontender. No organomegaly or masses felt. Normal bowel sounds heard. Central nervous system: Alert and oriented. No focal neurological deficits appreciated on examination; patient reports still intermittent  dizziness sensations and also left-sided numbness. Extremities: No cyanosis, clubbing or edema. Skin: No rashes, no petechiae. Psychiatry: Judgement and insight appear normal. Mood & affect appropriate.    Data Reviewed: No acute intracranial abnormality appreciated on CT scan; EKG without ischemic process.  Stable blood work otherwise. Negative respiratory panel for  influenza and COVID.  Negative alcohol level.  Lipid panel demonstrating LDL of 89 and a total cholesterol 149.  Assessment and Plan: * TIA (transient ischemic attack)- (present on admission) -admitted to telemetry  -following neurology recommendations will check MRI/MRA, CTA head and neck, 2-D echo, A1C, Lipid panel and evaluation by PT/OT/SPL. -will start tx with ASA and Plavix for 21 days and then just ASA -continue appropriate treatment with use of statins per LDL goals -allowing permissive HTN and appropriate treatment after. -anticipating discharge home in am, once workup completed.    Tobacco abuse- (present on admission) -cessation counseling provided -nicotine patch ordered  Elevated blood pressure reading without diagnosis of hypertension- (present on admission) -currently allowing permissive HTN while ruling out TIA/Stroke -will add antihypertensive regimen as needed  GERD (gastroesophageal reflux disease)- (present on admission) -lifestyle changes discussed with patient -continue PPI   Overweight (BMI 25.0-29.9)- (present on admission) -Body mass index is 30.9 kg/m. -per BMI patient qualifying for class 1 obesity -low calorie diet, portion control and increased physical activity discussed with patient.  Hyperlipidemia LDL goal <100- (present on admission) -prior hx of elevated LDL, patient was on three times a week statins -will check lipid panel -goal is for LDL < 100; adjust statin dose accordingly     Advance Care Planning:   Code Status: Full Code   Consults: neurology (tele-neuro)  Family Communication: no family at bedside  Severity of Illness: The appropriate patient status for this patient is OBSERVATION. Observation status is judged to be reasonable and necessary in order to provide the required intensity of service to ensure the patient's safety. The patient's presenting symptoms, physical exam findings, and initial radiographic and laboratory data in  the context of their medical condition is felt to place them at decreased risk for further clinical deterioration. Furthermore, it is anticipated that the patient will be medically stable for discharge from the hospital within 2 midnights of admission.   Author: Barton Dubois, MD 03/19/2021 10:41 AM  For on call review [email or web address].

## 2021-03-19 NOTE — ED Triage Notes (Addendum)
Patient woke this morning after using restroom laid back down and had a strange feeling in her head and was unable to move or get up for maybe 15-20 minutes with blurry vision.

## 2021-03-19 NOTE — Progress Notes (Signed)
*  PRELIMINARY RESULTS* Echocardiogram 2D Echocardiogram has been performed with Def[facility].  Samuel Germany 03/19/2021, 3:33 PM

## 2021-03-19 NOTE — Progress Notes (Signed)
Patient alert and oriented. Patient ambulated self to bathroom. Continuing NIH Stroke Scale, patient given early stages of recovery stroke book. Patient reports no complaints of pain/discomfort.

## 2021-03-19 NOTE — Assessment & Plan Note (Addendum)
-  admitted to telemetry; no arrhythmia appreciated. -A1c 6.4; LDL at goal.  -MRI demonstrated no acute stroke and her CT angiogram head and neck without significant stenosis. - Patient's symptoms completely resolved.  No recommendations by PT/OT or SPL at time of discharge.  Patient is safe to return home. -Following neurology recommendations will start therapy with ASA and Plavix for 21 days and then just maybe ASA on daily basis for secondary prevention. -continue risk factor modification. -Outpatient follow-up with PCP in 2 weeks.

## 2021-03-19 NOTE — Progress Notes (Signed)
SLP Cancellation Note  Patient Details Name: Kristin Blackwell MRN: [id number] DOB: 1950/03/15   Cancelled treatment:       Reason Eval/Treat Not Completed: SLP screened, no needs identified, will sign off; SLP screened Pt in room. Pt denies any changes in swallowing, speech, language, or cognition. MRI negative for acute changes. SLE will be deferred at this time. Reconsult if indicated. SLP will sign off.  Thank you,  Genene Churn, [place]    [place] 03/19/2021, 1:06 PM

## 2021-03-19 NOTE — Assessment & Plan Note (Signed)
-  Body mass index is 30.9 kg/m. -per BMI patient qualifying for class 1 obesity -low calorie diet, portion control and increased physical activity discussed with patient.

## 2021-03-19 NOTE — Assessment & Plan Note (Addendum)
-  patient stop smoking 8 years ago -congratulated and encouraged to avoid second hans smoking.

## 2021-03-20 DIAGNOSIS — E669 Obesity, unspecified: Secondary | ICD-10-CM

## 2021-03-20 DIAGNOSIS — R7303 Prediabetes: Secondary | ICD-10-CM | POA: Diagnosis not present

## 2021-03-20 DIAGNOSIS — E538 Deficiency of other specified B group vitamins: Secondary | ICD-10-CM

## 2021-03-20 DIAGNOSIS — E785 Hyperlipidemia, unspecified: Secondary | ICD-10-CM | POA: Diagnosis not present

## 2021-03-20 DIAGNOSIS — Z720 Tobacco use: Secondary | ICD-10-CM | POA: Diagnosis not present

## 2021-03-20 DIAGNOSIS — R030 Elevated blood-pressure reading, without diagnosis of hypertension: Secondary | ICD-10-CM | POA: Diagnosis not present

## 2021-03-20 LAB — HEMOGLOBIN A1C
Hgb A1c MFr Bld: 6.4 % — ABNORMAL HIGH (ref 4.8–5.6)
Mean Plasma Glucose: 137 mg/dL

## 2021-03-20 MED ORDER — ASPIRIN 81 MG PO TBEC
81.0000 mg | DELAYED_RELEASE_TABLET | Freq: Every day | ORAL | 11 refills | Status: AC
Start: 2021-03-21 — End: ?

## 2021-03-20 MED ORDER — METFORMIN HCL ER 500 MG PO TB24: 500.0000 mg | ORAL_TABLET | Freq: Every day | ORAL | 3 refills | Status: DC

## 2021-03-20 MED ORDER — CLOPIDOGREL BISULFATE 75 MG PO TABS: 75.0000 mg | ORAL_TABLET | Freq: Every day | ORAL | 0 refills | Status: AC

## 2021-03-20 MED ORDER — CYANOCOBALAMIN 1000 MCG/ML IJ SOLN: INTRAMUSCULAR | 0 refills | Status: DC

## 2021-03-20 MED ORDER — PANTOPRAZOLE SODIUM 40 MG PO TBEC: 40.0000 mg | DELAYED_RELEASE_TABLET | Freq: Every day | ORAL | 0 refills | Status: DC

## 2021-03-20 MED ORDER — CYANOCOBALAMIN 1000 MCG/ML IJ SOLN
1000.0000 ug | Freq: Once | INTRAMUSCULAR | Status: AC
  Administered 2021-03-20: 1000 ug via INTRAMUSCULAR
  Filled 2021-03-20: qty 1

## 2021-03-20 NOTE — Assessment & Plan Note (Signed)
-   A1c 6.4 -Given risk factors presentation and continue trending up over A1c levels patient will be discharge on once a day metformin with instruction to follow modified carbohydrate diet and continue close follow-up with her PCP.

## 2021-03-20 NOTE — Progress Notes (Signed)
OT Cancellation Note  Patient Details Name: Kristin Blackwell MRN: [id number] DOB: [birth year]   Cancelled Treatment:    Reason Eval/Treat Not Completed: OT screened, no needs identified, will sign off. Pt up walking around in room upon arrival. Pt demonstrated good B UE strength and coordination. Pt reported symptoms had resolved.   Drenda Sobecki OT, MOT   Larey Seat 03/20/2021, 8:22 AM

## 2021-03-20 NOTE — Evaluation (Signed)
Physical Therapy Evaluation Patient Details Name: Kristin Blackwell MRN: [id number] DOB: 15-Jan-1951 Today's Date: 03/20/2021  History of Present Illness  Kristin Blackwell is a 71 y.o. female with medical history significant of hyperlipidemia, prediabetes, borderline hypertension, overweight and prior history of tobacco abuse; who presented to the emergency department secondary to dizziness and left-sided weakness after waking up earlier this morning.  Patient reports partial improvement in her symptoms by the time of arrival to the emergency department.  But expressed still ongoing intermittent spinning sensation especially when she lays down flat.  Patient also reporting some numbness on her left side; normal strength appreciated.  Patient denies chest pain, nausea, vomiting, dysuria, shortness of breath, fever, hematuria, melena, hematochezia, diarrhea, abdominal pain, sick contacts or any other complaints.   Clinical Impression  Patient functioning at baseline for functional mobility and gait demonstrating good return for ambulating on level, inclined, declined surfaces and stairs without loss of balance.  Plan:  Patient discharged from physical therapy to care of nursing for ambulation daily as tolerated for length of stay.         Recommendations for follow up therapy are one component of a multi-disciplinary discharge planning process, led by the attending physician.  Recommendations may be updated based on patient status, additional functional criteria and insurance authorization.  Follow Up Recommendations No PT follow up    Assistance Recommended at Discharge None  Patient can return home with the following  Other (comment) (at baseline)    Equipment Recommendations None recommended by PT  Recommendations for Other Services       Functional Status Assessment Patient has not had a recent decline in their functional status     Precautions / Restrictions Precautions Precautions:  None Restrictions Weight Bearing Restrictions: No      Mobility  Bed Mobility Overal bed mobility: Independent                  Transfers Overall transfer level: Independent                      Ambulation/Gait Ambulation/Gait assistance: Independent Gait Distance (Feet): 200 Feet Assistive device: None Gait Pattern/deviations: WFL(Within Functional Limits) Gait velocity: near normal     General Gait Details: demonstrates good return for ambulation on level, inclined and declined surfaces without loss of balance  Stairs Stairs: Yes Stairs assistance: Modified independent (Device/Increase time) Stair Management: One rail Right, One rail Left, Alternating pattern Number of Stairs: 10 General stair comments: demonstrates good return for going up/down stairs using 1 siderail without loss of balance  Wheelchair Mobility    Modified Rankin (Stroke Patients Only)       Balance Overall balance assessment: Independent                                           Pertinent Vitals/Pain Pain Assessment Pain Assessment: No/denies pain    Home Living Family/patient expects to be discharged to:: Private residence Living Arrangements: Alone Available Help at Discharge: Family;Available PRN/intermittently Type of Home: House (Split level) Home Access: Stairs to enter Entrance Stairs-Rails: Right Entrance Stairs-Number of Steps: 6   Home Layout: Two level;Other (Comment) (split level) Home Equipment: Grab bars - tub/shower;None      Prior Function Prior Level of Function : Independent/Modified Independent             Mobility Comments:  Community ambulator, drives, works ADLs Comments: Independent     Hand Dominance        Extremity/Trunk Assessment   Upper Extremity Assessment Upper Extremity Assessment: Defer to OT evaluation    Lower Extremity Assessment Lower Extremity Assessment: Overall WFL for tasks assessed     Cervical / Trunk Assessment Cervical / Trunk Assessment: Normal  Communication   Communication: No difficulties  Cognition Arousal/Alertness: Awake/alert Behavior During Therapy: WFL for tasks assessed/performed Overall Cognitive Status: Within Functional Limits for tasks assessed                                          General Comments      Exercises     Assessment/Plan    PT Assessment Patient does not need any further PT services  PT Problem List         PT Treatment Interventions      PT Goals (Current goals can be found in the Care Plan section)  Acute Rehab PT Goals Patient Stated Goal: return home PT Goal Formulation: With patient Time For Goal Achievement: 03/20/21 Potential to Achieve Goals: Good    Frequency       Co-evaluation               AM-PAC PT "6 Clicks" Mobility  Outcome Measure Help needed turning from your back to your side while in a flat bed without using bedrails?: None Help needed moving from lying on your back to sitting on the side of a flat bed without using bedrails?: None Help needed moving to and from a bed to a chair (including a wheelchair)?: None Help needed standing up from a chair using your arms (e.g., wheelchair or bedside chair)?: None Help needed to walk in hospital room?: None Help needed climbing 3-5 steps with a railing? : None 6 Click Score: 24    End of Session   Activity Tolerance: Patient tolerated treatment well Patient left: in bed;with call bell/phone within reach Nurse Communication: Mobility status PT Visit Diagnosis: Unsteadiness on feet (R26.81);Other abnormalities of gait and mobility (R26.89);Muscle weakness (generalized) (M62.81)    Time: 4098-1191 PT Time Calculation (min) (ACUTE ONLY): 20 min   Charges:   PT Evaluation $PT Eval Moderate Complexity: 1 Mod PT Treatments $Therapeutic Activity: 23-37 mins        12:15 PM, 03/20/21 Lonell Grandchild, MPT Physical  Therapist with [facility] [phone] office [phone] mobile phone

## 2021-03-20 NOTE — Plan of Care (Signed)
°  Problem: Education: Goal: Knowledge of General Education information will improve Description: Including pain rating scale, medication(s)/side effects and non-pharmacologic comfort measures Outcome: Progressing   Problem: Health Behavior/Discharge Planning: Goal: Ability to manage health-related needs will improve Outcome: Progressing   Problem: Clinical Measurements: Goal: Ability to maintain clinical measurements within normal limits will improve Outcome: Progressing Goal: Will remain free from infection Outcome: Progressing Goal: Diagnostic test results will improve Outcome: Progressing Goal: Respiratory complications will improve Outcome: Progressing Goal: Cardiovascular complication will be avoided Outcome: Progressing   Problem: Activity: Goal: Risk for activity intolerance will decrease Outcome: Progressing   Problem: Nutrition: Goal: Adequate nutrition will be maintained Outcome: Progressing   Problem: Coping: Goal: Level of anxiety will decrease Outcome: Progressing   Problem: Elimination: Goal: Will not experience complications related to bowel motility Outcome: Progressing Goal: Will not experience complications related to urinary retention Outcome: Progressing   Problem: Pain Managment: Goal: General experience of comfort will improve Outcome: Progressing   Problem: Safety: Goal: Ability to remain free from injury will improve Outcome: Progressing   Problem: Skin Integrity: Goal: Risk for impaired skin integrity will decrease Outcome: Progressing   Problem: Education: Goal: Knowledge of disease or condition will improve Outcome: Progressing Goal: Knowledge of secondary prevention will improve (SELECT ALL) Outcome: Progressing Goal: Knowledge of patient specific risk factors will improve (INDIVIDUALIZE FOR PATIENT) Outcome: Progressing Goal: Individualized Educational Video(s) Outcome: Progressing   Problem: Coping: Goal: Will identify  appropriate support needs Outcome: Progressing   Problem: Education: Goal: Knowledge of disease or condition will improve Outcome: Progressing Goal: Knowledge of secondary prevention will improve (SELECT ALL) Outcome: Progressing   Problem: Ischemic Stroke/TIA Tissue Perfusion: Goal: Complications of ischemic stroke/TIA will be minimized Outcome: Progressing

## 2021-03-20 NOTE — Plan of Care (Signed)
°  Problem: Education: Goal: Knowledge of General Education information will improve Description: Including pain rating scale, medication(s)/side effects and non-pharmacologic comfort measures Outcome: Adequate for Discharge   Problem: Health Behavior/Discharge Planning: Goal: Ability to manage health-related needs will improve Outcome: Adequate for Discharge   Problem: Clinical Measurements: Goal: Ability to maintain clinical measurements within normal limits will improve Outcome: Adequate for Discharge Goal: Will remain free from infection Outcome: Adequate for Discharge Goal: Diagnostic test results will improve Outcome: Adequate for Discharge Goal: Respiratory complications will improve Outcome: Adequate for Discharge Goal: Cardiovascular complication will be avoided Outcome: Adequate for Discharge   Problem: Activity: Goal: Risk for activity intolerance will decrease Outcome: Adequate for Discharge   Problem: Nutrition: Goal: Adequate nutrition will be maintained Outcome: Adequate for Discharge   Problem: Coping: Goal: Level of anxiety will decrease Outcome: Adequate for Discharge   Problem: Elimination: Goal: Will not experience complications related to bowel motility Outcome: Adequate for Discharge Goal: Will not experience complications related to urinary retention Outcome: Adequate for Discharge   Problem: Pain Managment: Goal: General experience of comfort will improve Outcome: Adequate for Discharge   Problem: Safety: Goal: Ability to remain free from injury will improve Outcome: Adequate for Discharge   Problem: Skin Integrity: Goal: Risk for impaired skin integrity will decrease Outcome: Adequate for Discharge   Problem: Education: Goal: Knowledge of disease or condition will improve Outcome: Adequate for Discharge Goal: Knowledge of secondary prevention will improve (SELECT ALL) Outcome: Adequate for Discharge Goal: Knowledge of patient specific  risk factors will improve (INDIVIDUALIZE FOR PATIENT) Outcome: Adequate for Discharge Goal: Individualized Educational Video(s) Outcome: Adequate for Discharge   Problem: Coping: Goal: Will identify appropriate support needs Outcome: Adequate for Discharge   Problem: Education: Goal: Knowledge of disease or condition will improve Outcome: Adequate for Discharge Goal: Knowledge of secondary prevention will improve (SELECT ALL) Outcome: Adequate for Discharge   Problem: Ischemic Stroke/TIA Tissue Perfusion: Goal: Complications of ischemic stroke/TIA will be minimized Outcome: Adequate for Discharge

## 2021-03-20 NOTE — Discharge Summary (Addendum)
Physician Discharge Summary   Patient: Kristin Blackwell MRN: [id number] DOB: July 29, 1950  Admit date:     03/19/2021  Discharge date: 03/20/21  Discharge Physician: Barton Dubois   PCP: Fayrene Helper, MD   Recommendations at discharge:  Repeat basic metabolic panel to follow electrolytes and renal function. Reassess blood pressure and initiate antihypertensive regimen if needed. Continue close monitoring of patient's CBGs/A1c for further adjustment to hypoglycemia regimen as required. Continue to follow patient's B12 level at the need for further maintenance therapy (after completing repletion series ordered).  Discharge Diagnoses: Principal Problem:   TIA (transient ischemic attack) Active Problems:   Hyperlipidemia LDL goal <100   Overweight (BMI 25.0-29.9)   Prediabetes   GERD (gastroesophageal reflux disease)   Elevated blood pressure reading without diagnosis of hypertension   Tobacco abuse Grade 1 diastolic dysfunction. B12 deficiency  Brief Hospital presentation course: Kristin Blackwell is a 71 y.o. female with medical history significant of hyperlipidemia, prediabetes, borderline hypertension, overweight and prior history of tobacco abuse; who presented to the emergency department secondary to dizziness and left-sided weakness after waking up earlier this morning.  Patient reports partial improvement in her symptoms by the time of arrival to the emergency department.  But expressed still ongoing intermittent spinning sensation especially when she lays down flat.  Patient also reporting some numbness on her left side; normal strength appreciated.  Patient denies chest pain, nausea, vomiting, dysuria, shortness of breath, fever, hematuria, melena, hematochezia, diarrhea, abdominal pain, sick contacts or any other complaints.   ED course: CT head demonstrating no acute intracranial abnormalities; patient did not receive tPA secondary to mild symptoms and already ongoing  improvement.  Case discussed with teleneurology with recommendation for admission to complete TIA/stroke work-up.  Assessment and Plan: * TIA (transient ischemic attack)- (present on admission) -admitted to telemetry; no arrhythmia appreciated. -A1c 6.4; LDL at goal.  -MRI demonstrated no acute stroke and her CT angiogram head and neck without significant stenosis. - Patient's symptoms completely resolved.  No recommendations by PT/OT or SPL at time of discharge.  Patient is safe to return home. -Following neurology recommendations will start therapy with ASA and Plavix for 21 days and then just maybe ASA on daily basis for secondary prevention. -continue risk factor modification. -Outpatient follow-up with PCP in 2 weeks.  Tobacco abuse- (present on admission) -patient stop smoking 8 years ago -congratulated and encouraged to avoid second hans smoking.  Elevated blood pressure reading without diagnosis of hypertension- (present on admission) -Patient was allowed for permissive hypertension while ruling out ischemia. -Blood pressure has a stabilized inside the hospital without the use of any medications. -Patient expressed being told in the past had borderline hypertension. -She would like to pursuit lifestyle modification changes and agrees during 1-month.  Blood pressure is not at goal she will take medications for it. -She will follow closely with her PCP as an outpatient. -Continue heart healthy/low-sodium diet at discharge.  GERD (gastroesophageal reflux disease)- (present on admission) -lifestyle changes discussed with patient -started on protonix given initiation of dual antiplatelet therapy for GI prophylaxis  -Recommending to stop the use of PPI after 20 more days of GI prophylaxis. -Patient advised to maintain adequate hydration.  Prediabetes- (present on admission) - A1c 6.4 -Given risk factors presentation and continue trending up over A1c levels patient will be discharge  on once a day metformin with instruction to follow modified carbohydrate diet and continue close follow-up with her PCP.  Overweight (BMI 25.0-29.9)- (present on admission) -  Body mass index is 30.9 kg/m. -per BMI patient qualifying for class 1 obesity -low calorie diet, portion control and increased physical activity discussed with patient.  Hyperlipidemia LDL goal <100- (present on admission) -prior hx of elevated LDL, patient was on three times a week statins, which will be continue. -Llipid panel demonstrating LDL of 89 and total cholesterol of 149. -goal is for LDL < 100; continue to follow lipid panel as an outpatient and further reduce his statins as needed.  Chronic grade 1 diastolic dysfunction -Lifestyle changes and good blood pressure control discussed with patient -Instructed to follow daily weights and to follow low-sodium diet. -Will recommend initiation of antihypertensive treatment as required. -Overall compensated. -2D echo: Demonstrating mild left ventricular hypertrophy with grade 1 diastolic dysfunction, normal ejection fraction and no wall motion abnormalities.  No significant valvular disorder appreciated.  B12 deficiency -Repletion has been initiated -Patient to follow-up with PCP in order to have B12 level recheck in approximately 82-month, with further decisions at that time to determine the need of further oral or intramuscular injection maintenance therapy. -B12 171  Consultants: Neurology Procedures performed: See below for x-ray reports. Disposition: Home Diet recommendation:  Discharge Diet Orders (From admission, onward)     Start     Ordered   03/20/21 0000  Diet - low sodium heart healthy        03/20/21 1117           Cardiac and Carb modified diet  DISCHARGE MEDICATION: Allergies as of 03/20/2021       Reactions   Ibandronate Sodium    Lipitor [atorvastatin]    Muscle aches   Pravastatin    Muscle aches        Medication List      TAKE these medications    aspirin 81 MG EC tablet Take 1 tablet (81 mg total) by mouth daily. Swallow whole. Start taking on: March 21, 2021   clopidogrel 75 MG tablet Commonly known as: PLAVIX Take 1 tablet (75 mg total) by mouth daily for 20 days. Start taking on: March 21, 2021   cyanocobalamin 1000 MCG/ML injection Commonly known as: (VITAMIN B-12) Indurated at 1000 mcg daily x3 days; then weekly x1 month and then monthly after that.   HAIR/SKIN/NAILS/BIOTIN PO Take 1 capsule by mouth daily.   metFORMIN 500 MG 24 hr tablet Commonly known as: Glucophage XR Take 1 tablet (500 mg total) by mouth daily with breakfast.   pantoprazole 40 MG tablet Commonly known as: PROTONIX Take 1 tablet (40 mg total) by mouth daily for 20 days. Start taking on: March 21, 2021   rosuvastatin 10 MG tablet Commonly known as: CRESTOR TAKE ONE TABLET EVERY EVENING ON MONDAY, WEDNESDAY AND FRIDAY   Vitamin D 50 MCG (2000 UT) Caps Take 1 capsule by mouth daily.   zinc gluconate 50 MG tablet Take 50 mg by mouth daily.        Follow-up Information     Fayrene Helper, MD Follow up in 2 week(s).   Specialty: Family Medicine Contact information: [place] [address] [phone]                 Discharge Exam: Danley Danker Weights   03/19/21 0820  Weight: 81.6 kg   General exam: Alert, awake, oriented x 3 Respiratory system: Clear to auscultation. Respiratory effort normal. Cardiovascular system:RRR. No murmurs, rubs, gallops. Gastrointestinal system: Abdomen is nondistended, soft and nontender. No organomegaly or masses felt. Normal  bowel sounds heard. Central nervous system: Alert and oriented. No focal neurological deficits. Extremities: No C/C/E, +pedal pulses Skin: No rashes, lesions or ulcers Psychiatry: Judgement and insight appear normal. Mood & affect appropriate.    Condition at discharge: stable and improved. At discharge  denying any neurologic symptoms.   The results of significant diagnostics from this hospitalization (including imaging, microbiology, ancillary and laboratory) are listed below for reference.   Imaging Studies: MR BRAIN WO CONTRAST  Result Date: 03/19/2021 CLINICAL DATA:  Neuro deficit, acute, stroke suspected EXAM: MRI HEAD WITHOUT CONTRAST TECHNIQUE: Multiplanar, multiecho pulse sequences of the brain and surrounding structures were obtained without intravenous contrast. COMPARISON:  Same day CT. FINDINGS: Brain: No acute infarction, hemorrhage, hydrocephalus, extra-axial collection or mass lesion. Vascular: Major arterial flow voids are maintained skull base. Skull and upper cervical spine: Normal marrow signal. Sinuses/Orbits: Clear sinuses.  Unremarkable orbits. Other: No mastoid effusions. IMPRESSION: No evidence of acute abnormality. Electronically Signed   By: Margaretha Sheffield M.D.   On: 03/19/2021 11:21   ECHOCARDIOGRAM COMPLETE  Result Date: 03/19/2021    ECHOCARDIOGRAM REPORT   Patient Name:   CORTNI TAYS Date of Exam: 03/19/2021 Medical Rec #:  [id number]         Height:       64.0 in Accession #:    [id number]        Weight:       180.0 lb Date of Birth:  April 18, 1950        BSA:          1.871 m Patient Age:    60 years          BP:           125/64 mmHg Patient Gender: F                 HR:           52 bpm. Exam Location:  [name] Procedure: 2D Echo, Cardiac Doppler and Color Doppler Indications:    TIA G45.9  History:        Patient has no prior history of Echocardiogram examinations.                 TIA, Arrythmias:LBBB; Risk Factors:Dyslipidemia, Current Smoker                 and Prediabetes.  Sonographer:    Alvino Chapel RCS Referring Phys: [place]  1. Left ventricular ejection fraction, by estimation, is 60 to 65%. The left ventricle has normal function. The left ventricle has no regional wall motion abnormalities. Left ventricular diastolic parameters are  consistent with Grade I diastolic dysfunction (impaired relaxation).  2. Right ventricular systolic function is normal. The right ventricular size is normal. Tricuspid regurgitation signal is inadequate for assessing PA pressure.  3. The mitral valve is normal in structure. No evidence of mitral valve regurgitation. No evidence of mitral stenosis.  4. The aortic valve is tricuspid. Aortic valve regurgitation is not visualized. No aortic stenosis is present.  5. The inferior vena cava is normal in size with greater than 50% respiratory variability, suggesting right atrial pressure of 3 mmHg. FINDINGS  Left Ventricle: Left ventricular ejection fraction, by estimation, is 60 to 65%. The left ventricle has normal function. The left ventricle has no regional wall motion abnormalities. Definity contrast agent was given IV to delineate the left ventricular  endocardial borders. The left ventricular internal cavity size was normal in size. There  is no left ventricular hypertrophy. Left ventricular diastolic parameters are consistent with Grade I diastolic dysfunction (impaired relaxation). Normal left ventricular filling pressure. Right Ventricle: The right ventricular size is normal. No increase in right ventricular wall thickness. Right ventricular systolic function is normal. Tricuspid regurgitation signal is inadequate for assessing PA pressure. Left Atrium: Left atrial size was normal in size. Right Atrium: Right atrial size was normal in size. Pericardium: There is no evidence of pericardial effusion. Mitral Valve: The mitral valve is normal in structure. No evidence of mitral valve regurgitation. No evidence of mitral valve stenosis. Tricuspid Valve: The tricuspid valve is normal in structure. Tricuspid valve regurgitation is not demonstrated. No evidence of tricuspid stenosis. Aortic Valve: The aortic valve is tricuspid. Aortic valve regurgitation is not visualized. No aortic stenosis is present. Aortic valve mean  gradient measures 5.0 mmHg. Aortic valve peak gradient measures 10.9 mmHg. Aortic valve area, by VTI measures 1.84  cm. Pulmonic Valve: The pulmonic valve was not well visualized. Pulmonic valve regurgitation is not visualized. No evidence of pulmonic stenosis. Aorta: The aortic root is normal in size and structure. Venous: The inferior vena cava is normal in size with greater than 50% respiratory variability, suggesting right atrial pressure of 3 mmHg. IAS/Shunts: No atrial level shunt detected by color flow Doppler.  LEFT VENTRICLE PLAX 2D LVIDd:         4.80 cm   Diastology LVIDs:         3.00 cm   LV e' medial:    5.44 cm/s LV PW:         0.80 cm   LV E/e' medial:  11.0 LV IVS:        0.90 cm   LV e' lateral:   9.25 cm/s LVOT diam:     1.80 cm   LV E/e' lateral: 6.5 LV SV:         61 LV SV Index:   33 LVOT Area:     2.54 cm  RIGHT VENTRICLE RV S prime:     13.40 cm/s TAPSE (M-mode): 2.6 cm LEFT ATRIUM             Index        RIGHT ATRIUM           Index LA diam:        3.70 cm 1.98 cm/m   RA Area:     14.70 cm LA Vol (A2C):   65.5 ml 35.01 ml/m  RA Volume:   37.10 ml  19.83 ml/m LA Vol (A4C):   45.6 ml 24.38 ml/m LA Biplane Vol: 56.2 ml 30.04 ml/m  AORTIC VALVE AV Area (Vmax):    1.74 cm AV Area (Vmean):   2.07 cm AV Area (VTI):     1.84 cm AV Vmax:           164.92 cm/s AV Vmean:          103.216 cm/s AV VTI:            0.331 m AV Peak Grad:      10.9 mmHg AV Mean Grad:      5.0 mmHg LVOT Vmax:         113.00 cm/s LVOT Vmean:        84.100 cm/s LVOT VTI:          0.239 m LVOT/AV VTI ratio: 0.72  AORTA Ao Root diam: 3.10 cm MITRAL VALVE MV Area (PHT): 3.12 cm    SHUNTS MV Decel  Time: 243 msec    Systemic VTI:  0.24 m MV E velocity: 60.00 cm/s  Systemic Diam: 1.80 cm MV A velocity: 76.80 cm/s MV E/A ratio:  0.78 Carlyle Dolly MD Electronically signed by Carlyle Dolly MD Signature Date/Time: 03/19/2021/4:52:39 PM    Final    CT HEAD CODE STROKE WO CONTRAST  Result Date: 03/19/2021 CLINICAL DATA:   Code stroke. Neuro deficit, acute, stroke suspected; left-sided weakness EXAM: CT HEAD WITHOUT CONTRAST TECHNIQUE: Contiguous axial images were obtained from the base of the skull through the vertex without intravenous contrast. RADIATION DOSE REDUCTION: This exam was performed according to the departmental dose-optimization program which includes automated exposure control, adjustment of the mA and/or kV according to patient size and/or use of iterative reconstruction technique. COMPARISON:  None. FINDINGS: Brain: No acute intracranial hemorrhage, mass effect, or edema. Gray-white differentiation is preserved. Ventricles and sulci are normal in size and configuration. No extra-axial collection. Vascular: No hyperdense vessel. Skull: Unremarkable. Sinuses/Orbits: No acute abnormality. Other: Mastoid air cells are clear. ASPECTS ([place] Stroke Program Early CT Score) - Ganglionic level infarction (caudate, lentiform nuclei, internal capsule, insula, M1-M3 cortex): 7 - Supraganglionic infarction (M4-M6 cortex): 3 Total score (0-10 with 10 being normal): 10 IMPRESSION: There is no acute intracranial hemorrhage or evidence of acute infarction. ASPECT score is 10. These results were called by telephone at the time of interpretation on 03/19/2021 at 8:55 am to provider Godfrey Pick , who verbally acknowledged these results. Electronically Signed   By: Macy Mis M.D.   On: 03/19/2021 08:56   CT ANGIO NECK CODE STROKE  Result Date: 03/19/2021 CLINICAL DATA:  71 year old female with left side weakness. Neurologic deficit. EXAM: CT ANGIOGRAPHY NECK TECHNIQUE: Multidetector CT imaging of the neck was performed using the standard protocol during bolus administration of intravenous contrast. Multiplanar CT image reconstructions and MIPs were obtained to evaluate the vascular anatomy. Carotid stenosis measurements (when applicable) are obtained utilizing NASCET criteria, using the distal internal carotid diameter as the  denominator. RADIATION DOSE REDUCTION: This exam was performed according to the departmental dose-optimization program which includes automated exposure control, adjustment of the mA and/or kV according to patient size and/or use of iterative reconstruction technique. CONTRAST:  71mL OMNIPAQUE IOHEXOL 350 MG/ML SOLN COMPARISON:  Head CT 0851 hours today. 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 No acute osseous abnormality identified. Upper chest: Negative. Other neck: The glottis is closed. Otherwise negative neck soft tissues. Aortic arch: 3 vessel arch configuration with no arch atherosclerosis. Generalized tortuosity of the proximal great vessels. Right carotid system: Tortuous brachiocephalic artery and right CCA origin without plaque or stenosis. Negative right CCA and right carotid bifurcation. Mildly tortuous right ICA below the skull base without plaque or stenosis. Visible right ICA siphon is patent. Left carotid system: Mild tortuosity. Negative left CCA. Minimal soft and calcified plaque at the left ICA bulb without stenosis. Visible left ICA siphon is patent. Vertebral arteries: Tortuous proximal right subclavian artery and normal right vertebral artery origin with no plaque or stenosis. Right vertebral artery is tortuous in the neck but patent to the vertebrobasilar junction without plaque or stenosis. Normal right PICA origin. Visible proximal basilar artery is patent. Mildly tortuous proximal left subclavian artery and normal left vertebral artery origin. Mild V1 segment tortuosity. Codominant left vertebral artery is patent to the vertebrobasilar junction without plaque or stenosis. Anatomic variants: None. Review of the MIP images confirms the above findings IMPRESSION: 1. Generalized arterial tortuosity with minimal atherosclerosis in  the neck. No extracranial arterial stenosis. 2. 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 Electronically Signed    By: Genevie Ann M.D.   On: 03/19/2021 10:40    Microbiology: Results for orders placed or performed during the hospital encounter of 03/19/21  Resp Panel by RT-PCR (Flu A&B, Covid) Nasopharyngeal Swab     Status: None   Collection Time: 03/19/21  8:41 AM   Specimen: Nasopharyngeal Swab; Nasopharyngeal(NP) swabs in vial transport medium  Result Value Ref Range Status   SARS Coronavirus 2 by RT PCR NEGATIVE NEGATIVE Final    Comment: (NOTE) SARS-CoV-2 target nucleic acids are NOT DETECTED.  The SARS-CoV-2 RNA is generally detectable in upper respiratory specimens during the acute phase of infection. The lowest concentration of SARS-CoV-2 viral copies this assay can detect is 138 copies/mL. A negative result does not preclude SARS-Cov-2 infection and should not be used as the sole basis for treatment or other patient management decisions. A negative result may occur with  improper specimen collection/handling, submission of specimen other than nasopharyngeal swab, presence of viral mutation(s) within the areas targeted by this assay, and inadequate number of viral copies(<138 copies/mL). A negative result must be combined with clinical observations, patient history, and epidemiological information. The expected result is Negative.  Fact Sheet for Patients:  EntrepreneurPulse.com.au  Fact Sheet for Healthcare Providers:  IncredibleEmployment.be  This test is no t yet approved or cleared by the [place] FDA and  has been authorized for detection and/or diagnosis of SARS-CoV-2 by FDA under an Emergency Use Authorization (EUA). This EUA will remain  in effect (meaning this test can be used) for the duration of the COVID-19 declaration under Section 564(b)(1) of the Act, 21 U.S.C.section 360bbb-3(b)(1), unless the authorization is terminated  or revoked sooner.       Influenza A by PCR NEGATIVE NEGATIVE Final   Influenza B by PCR NEGATIVE  NEGATIVE Final    Comment: (NOTE) The Xpert Xpress SARS-CoV-2/FLU/RSV plus assay is intended as an aid in the diagnosis of influenza from Nasopharyngeal swab specimens and should not be used as a sole basis for treatment. Nasal washings and aspirates are unacceptable for Xpert Xpress SARS-CoV-2/FLU/RSV testing.  Fact Sheet for Patients: EntrepreneurPulse.com.au  Fact Sheet for Healthcare Providers: IncredibleEmployment.be  This test is not yet approved or cleared by the [place] FDA and has been authorized for detection and/or diagnosis of SARS-CoV-2 by FDA under an Emergency Use Authorization (EUA). This EUA will remain in effect (meaning this test can be used) for the duration of the COVID-19 declaration under Section 564(b)(1) of the Act, 21 U.S.C. section 360bbb-3(b)(1), unless the authorization is terminated or revoked.  Performed at [facility], [address] [place], [place] [address]     Labs: CBC: Recent Labs  Lab 03/19/21 0841 03/19/21 1002  WBC 5.8  --   NEUTROABS 2.7  --   HGB 12.1 12.9  HCT 37.6 38.0  MCV 86.4  --   PLT 291  --    Basic Metabolic Panel: Recent Labs  Lab 03/19/21 0841 03/19/21 1002  NA 136 142  K 3.7 3.8  CL 106 105  CO2 25  --   GLUCOSE 114* 105*  BUN 13 12  CREATININE 0.77 0.80  CALCIUM 8.7*  --    Liver Function Tests: Recent Labs  Lab 03/19/21 0841  AST 26  ALT 21  ALKPHOS 68  BILITOT 0.2*  PROT 7.4  ALBUMIN 3.7   CBG: Recent Labs  Lab 03/19/21  [place]    Discharge time spent: greater than 30 minutes.  Signed: Barton Dubois, MD [facility] 03/20/2021

## 2021-03-21 ENCOUNTER — Other Ambulatory Visit: Payer: Self-pay

## 2021-03-21 ENCOUNTER — Encounter: Payer: Self-pay | Admitting: Nurse Practitioner

## 2021-03-21 ENCOUNTER — Ambulatory Visit: Payer: BC Managed Care – PPO | Admitting: Nurse Practitioner

## 2021-03-21 VITALS — BP 122/78 | HR 45 | Ht 64.0 in | Wt 181.0 lb

## 2021-03-21 DIAGNOSIS — Z09 Encounter for follow-up examination after completed treatment for conditions other than malignant neoplasm: Secondary | ICD-10-CM | POA: Insufficient documentation

## 2021-03-21 DIAGNOSIS — E785 Hyperlipidemia, unspecified: Secondary | ICD-10-CM | POA: Diagnosis not present

## 2021-03-21 DIAGNOSIS — E538 Deficiency of other specified B group vitamins: Secondary | ICD-10-CM

## 2021-03-21 DIAGNOSIS — G4733 Obstructive sleep apnea (adult) (pediatric): Secondary | ICD-10-CM

## 2021-03-21 DIAGNOSIS — R7303 Prediabetes: Secondary | ICD-10-CM | POA: Diagnosis not present

## 2021-03-21 DIAGNOSIS — R030 Elevated blood-pressure reading, without diagnosis of hypertension: Secondary | ICD-10-CM

## 2021-03-21 MED ORDER — CYANOCOBALAMIN 1000 MCG/ML IJ SOLN
1000.0000 ug | Freq: Once | INTRAMUSCULAR | Status: AC
  Administered 2021-03-21: 1000 ug via INTRAMUSCULAR

## 2021-03-21 NOTE — Assessment & Plan Note (Signed)
Pt shown how to self inject Vitamin B12 injection  today. Recheck labs in 3 months.

## 2021-03-21 NOTE — Assessment & Plan Note (Signed)
DASH diet and commitment to daily physical activity for a minimum of 30 minutes discussed and encouraged, as a part of hypertension management. The importance of attaining a healthy weight is also discussed.  BP/Weight 03/21/2021 03/20/2021 03/19/2021 02/03/2021 11/26/2020 09/19/7791 10/23/8862  Systolic BP 847 207 - 218 288 337 445  Diastolic BP 78 58 - 84 94 82 92  Wt. (Lbs) 181 - 180 183.08 182 178 176.8  BMI 31.07 - 30.9 31.43 30.29 29.62 30.35   Currently not on medication.

## 2021-03-21 NOTE — Patient Instructions (Signed)
Please get your labs done 3-5 days before your next appointment.   It is important that you exercise regularly at least 30 minutes 5 times a week.  Think about what you will eat, plan ahead. Choose " clean, green, fresh or frozen" over canned, processed or packaged foods which are more sugary, salty and fatty. 70 to 75% of food eaten should be vegetables and fruit. Three meals at set times with snacks allowed between meals, but they must be fruit or vegetables. Aim to eat over a 12 hour period , example 7 am to 7 pm, and STOP after  your last meal of the day. Drink water,generally about 64 ounces per day, no other drink is as healthy. Fruit juice is best enjoyed in a healthy way, by EATING the fruit.  Thanks for choosing [facility], we consider it a privelige to serve you.

## 2021-03-21 NOTE — Assessment & Plan Note (Addendum)
Follow up for hospital admission from 03/19/21-03/20/21 for TIA.  Today patient denies dizziness left-sided weakness ,shortness of breath, chest pain nausea or vomiting. TIA / stroke work up was negative at the hospital . She was prescribed  Plavix 75 mg daily for 21 days for secondary prevention metformin 500mg  daily for prediabetes,  vitamin B 12 injection,for low vitamin B12,  told to recheck labs in 3 months.   Pt states that she Will start metformin 500, Asprin 81mg  , vitamin b12 injection and Plavix today.  Patient stated as she will need a nurse to show her how to do vitamin B12 injection.    Pt stated that she will start taking ll the newly prescribed medications today.   Nurse provided education on  how to self inject vitamin B12 injections today. BMP Ordered.

## 2021-03-21 NOTE — Assessment & Plan Note (Addendum)
Lab Results  Component Value Date   HGBA1C 6.4 (H) 03/19/2021   Will start metformin XR 500mg  daily today

## 2021-03-21 NOTE — Progress Notes (Signed)
° °  Kristin Blackwell     MRN: [id number]      DOB: [birth year]   HPI Kristin Blackwell is here for follow up for hospital admission from 03/19/21-03/20/21 for TIA.  Today patient denies dizziness left-sided weakness ,shortness of breath, chest pain nausea or vomiting. TIA / stroke work up was negative at the hospital . She was prescribed  Plavix 75 mg daily for 21 days for secondary prevention. Also metformin 500mg  daily for prediabetes,  vitamin B 12 injection,for low vitamin B12,  told to recheck labs in 3 months.   Pt states that she Will start metformin 500, Asprin 81mg  , vitamin b12 injection and Plavix today.  Patient stated as she will need a nurse to show her how to do vitamin B12 injection.   Pt states that she had her mammogram done in Dec 2022 and that the result was normal, done at [facility].   She wears CPAP  for  OSA, she thinks that the CPAP caused her TIA, pt encouraged to continue with her CPAP to enable oxygen supply to her brain she verbalized understanding.     ROS Denies recent fever or chills. Denies sinus pressure, nasal congestion, ear pain or sore throat. Denies chest congestion, productive cough or wheezing. Denies chest pains, palpitations and leg swelling Denies abdominal pain, nausea, vomiting,diarrhea or constipation.   Denies dysuria, frequency, hesitancy or incontinence. Denies joint pain, swelling and limitation in mobility. Denies headaches, seizures, numbness, or tingling.    PE  BP 122/78 (BP Location: Left Arm, Patient Position: Sitting, Cuff Size: Normal)    Pulse (!) 45    Ht 5\' 4"  (1.626 m)    Wt 181 lb (82.1 kg)    SpO2 98%    BMI 31.07 kg/m   Patient alert and oriented and in no cardiopulmonary distress.  Chest: Clear to auscultation bilaterally.  CVS: S1, S2 no murmurs, no S3.Regular rate.  ABD: Soft non tender.   Ext: No edema  MS: Adequate ROM spine, shoulders, hips and knees.  Skin: Intact, no ulcerations or rash noted.  Psych:  Good eye contact, normal affect. Memory intact not anxious or depressed appearing.  CNS: CN 2-12 intact, power,  normal throughout.no focal deficits noted.   Assessment & Plan

## 2021-03-21 NOTE — Assessment & Plan Note (Signed)
Continue Crestor 10 mg

## 2021-03-21 NOTE — Assessment & Plan Note (Signed)
Pt encouraged to keep using her CPAP machine,need to wear CPAP discussed with pt she verbalized understaing

## 2021-03-22 LAB — BASIC METABOLIC PANEL
BUN/Creatinine Ratio: 20 (ref 12–28)
BUN: 19 mg/dL (ref 8–27)
CO2: 23 mmol/L (ref 20–29)
Calcium: 9.7 mg/dL (ref 8.7–10.3)
Chloride: 105 mmol/L (ref 96–106)
Creatinine, Ser: 0.94 mg/dL (ref 0.57–1.00)
Glucose: 92 mg/dL (ref 70–99)
Potassium: 4.4 mmol/L (ref 3.5–5.2)
Sodium: 140 mmol/L (ref 134–144)
eGFR: 65 mL/min/{1.73_m2} (ref >59)

## 2021-03-22 NOTE — Progress Notes (Signed)
Please review results with patient her kidney function and electrolytes are within normal range. Thank you.

## 2021-03-26 ENCOUNTER — Other Ambulatory Visit: Payer: Self-pay

## 2021-03-26 ENCOUNTER — Encounter (HOSPITAL_COMMUNITY): Payer: Self-pay | Admitting: *Deleted

## 2021-03-26 ENCOUNTER — Telehealth: Payer: Self-pay

## 2021-03-26 ENCOUNTER — Emergency Department (HOSPITAL_COMMUNITY)
Admission: EM | Admit: 2021-03-26 | Discharge: 2021-03-26 | Disposition: A | Discharge status: Home / Self Care | Payer: BC Managed Care – PPO | Attending: Emergency Medicine | Admitting: Emergency Medicine

## 2021-03-26 DIAGNOSIS — R42 Dizziness and giddiness: Secondary | ICD-10-CM

## 2021-03-26 DIAGNOSIS — D649 Anemia, unspecified: Secondary | ICD-10-CM | POA: Diagnosis not present

## 2021-03-26 DIAGNOSIS — R319 Hematuria, unspecified: Secondary | ICD-10-CM

## 2021-03-26 LAB — CBC WITH DIFFERENTIAL/PLATELET
Abs Immature Granulocytes: 0.01 10*3/uL (ref 0.00–0.07)
Basophils Absolute: 0.1 10*3/uL (ref 0.0–0.1)
Basophils Relative: 1 %
Eosinophils Absolute: 0.1 10*3/uL (ref 0.0–0.5)
Eosinophils Relative: 1 %
HCT: 39.7 % (ref 36.0–46.0)
Hemoglobin: 12.5 g/dL (ref 12.0–15.0)
Immature Granulocytes: 0 %
Lymphocytes Relative: 50 %
Lymphs Abs: 3.8 10*3/uL (ref 0.7–4.0)
MCH: 27.5 pg (ref 26.0–34.0)
MCHC: 31.5 g/dL (ref 30.0–36.0)
MCV: 87.4 fL (ref 80.0–100.0)
Monocytes Absolute: 0.4 10*3/uL (ref 0.1–1.0)
Monocytes Relative: 5 %
Neutro Abs: 3.3 10*3/uL (ref 1.7–7.7)
Neutrophils Relative %: 43 %
Platelets: 310 10*3/uL (ref 150–400)
RBC: 4.54 MIL/uL (ref 3.87–5.11)
RDW: 15.3 % (ref 11.5–15.5)
WBC: 7.6 10*3/uL (ref 4.0–10.5)
nRBC: 0 % (ref 0.0–0.2)

## 2021-03-26 LAB — URINALYSIS, ROUTINE W REFLEX MICROSCOPIC
Bilirubin Urine: NEGATIVE
Glucose, UA: NEGATIVE mg/dL
Ketones, ur: NEGATIVE mg/dL
Leukocytes,Ua: NEGATIVE
Nitrite: NEGATIVE
Protein, ur: NEGATIVE mg/dL
Specific Gravity, Urine: 1.005 (ref 1.005–1.030)
pH: 5 (ref 5.0–8.0)

## 2021-03-26 LAB — BASIC METABOLIC PANEL
Anion gap: 10 (ref 5–15)
BUN: 16 mg/dL (ref 8–23)
CO2: 22 mmol/L (ref 22–32)
Calcium: 9.4 mg/dL (ref 8.9–10.3)
Chloride: 103 mmol/L (ref 98–111)
Creatinine, Ser: 0.75 mg/dL (ref 0.44–1.00)
GFR, Estimated: >60 mL/min (ref >60)
Glucose, Bld: 86 mg/dL (ref 70–99)
Potassium: 3.7 mmol/L (ref 3.5–5.1)
Sodium: 135 mmol/L (ref 135–145)

## 2021-03-26 NOTE — ED Notes (Signed)
Pen pad not working in rm 9. Pt given d/c instructions and verbalized understanding. Ambulatory to waiting area. No further questions at this time.

## 2021-03-26 NOTE — Telephone Encounter (Signed)
States she started feeling weak while at work and went to the restroom and wiped blood after she urinated. Advised her to return back to the ER to be evaluated

## 2021-03-26 NOTE — ED Triage Notes (Signed)
Pt started taking Plavix a week ago, noted some blood in her urine today.  Pt with some lightheadedness today, lasted for awhile for about an hour and half per husband.  Pt states she feels better, states she has been drinking some water. Pt states she was dx'd with TIA last week.

## 2021-03-26 NOTE — ED Provider Notes (Signed)
[facility] EMERGENCY DEPARTMENT Provider Note   CSN: [id number] Arrival date & time: 03/26/21  1433     History  Chief Complaint  Patient presents with   Hematuria    Kristin Blackwell is a 71 y.o. female.  She is complaining of feeling lightheaded today and when she urinated she had some blood in her urine.  She is urinated since that and does not have any blood.  She does have a funny feeling in her lower abdomen.  Denies any dysuria.  Lightheadedness still remains.  She was just admitted to the hospital last week for an episode of weakness and blurry vision and diagnosed with a TIA.  MRI was negative.  Started on Plavix.  No blurry vision chest pain shortness of breath nausea vomiting diarrhea.  No other bleeding identified.  No headache.  The history is provided by the patient.  Hematuria The current episode started 3 to 5 hours ago. The problem has been resolved. Pertinent negatives include no chest pain, no abdominal pain, no headaches and no shortness of breath. Nothing aggravates the symptoms. Nothing relieves the symptoms. She has tried nothing for the symptoms. The treatment provided significant relief.  Dizziness Quality:  Lightheadedness Onset quality:  Gradual Duration:  1 day Timing:  Intermittent Progression:  Unchanged Chronicity:  New Relieved by:  None tried Worsened by:  Nothing Ineffective treatments:  None tried Associated symptoms: no chest pain, no headaches and no shortness of breath   Risk factors: new medications       Home Medications Prior to Admission medications   Medication Sig Start Date End Date Taking? Authorizing Provider  aspirin EC 81 MG EC tablet Take 1 tablet (81 mg total) by mouth daily. Swallow whole. 03/21/21   Barton Dubois, MD  Cholecalciferol (VITAMIN D) 50 MCG (2000 UT) CAPS Take 1 capsule by mouth daily.    [provider]  clopidogrel (PLAVIX) 75 MG tablet Take 1 tablet (75 mg total) by mouth daily for 20 days. 03/21/21  04/10/21  Barton Dubois, MD  cyanocobalamin (,VITAMIN B-12,) 1000 MCG/ML injection Indurated at 1000 mcg daily x3 days; then weekly x1 month and then monthly after that. 03/20/21   Barton Dubois, MD  metFORMIN (GLUCOPHAGE XR) 500 MG 24 hr tablet Take 1 tablet (500 mg total) by mouth daily with breakfast. 03/20/21 03/20/22  Barton Dubois, MD  Multiple Vitamins-Minerals (HAIR/SKIN/NAILS/BIOTIN PO) Take 1 capsule by mouth daily.    [provider]  pantoprazole (PROTONIX) 40 MG tablet Take 1 tablet (40 mg total) by mouth daily for 20 days. 03/21/21 04/10/21  Barton Dubois, MD  rosuvastatin (CRESTOR) 10 MG tablet TAKE ONE TABLET EVERY EVENING ON [facility] AND FRIDAY 11/11/20   Fayrene Helper, MD  zinc gluconate 50 MG tablet Take 50 mg by mouth daily.    [provider]      Allergies    Ibandronate sodium, Lipitor [atorvastatin], and Pravastatin    Review of Systems   Review of Systems  Constitutional:  Negative for fever.  HENT:  Negative for sore throat.   Eyes:  Negative for visual disturbance.  Respiratory:  Negative for shortness of breath.   Cardiovascular:  Negative for chest pain.  Gastrointestinal:  Negative for abdominal pain.  Genitourinary:  Positive for hematuria. Negative for dysuria.  Musculoskeletal:  Negative for neck pain.  Skin:  Negative for rash.  Neurological:  Positive for dizziness. Negative for headaches.   Physical Exam Updated Vital Signs BP (!) 157/82 (BP  Location: Right Arm)    Pulse 60    Temp 98.3 F (36.8 C) (Oral)    Resp 16    Ht 5\' 4"  (1.626 m)    Wt 80.7 kg    SpO2 99%    BMI 30.55 kg/m  Physical Exam Vitals and nursing note reviewed.  Constitutional:      General: She is not in acute distress.    Appearance: Normal appearance. She is well-developed.  HENT:     Head: Normocephalic and atraumatic.  Eyes:     Conjunctiva/sclera: Conjunctivae normal.  Cardiovascular:     Rate and Rhythm: Normal rate and regular rhythm.      Heart sounds: No murmur heard. Pulmonary:     Effort: Pulmonary effort is normal. No respiratory distress.     Breath sounds: Normal breath sounds.  Abdominal:     Palpations: Abdomen is soft.     Tenderness: There is no abdominal tenderness.  Musculoskeletal:        General: No swelling. Normal range of motion.     Cervical back: Neck supple.     Right lower leg: No edema.     Left lower leg: No edema.  Skin:    General: Skin is warm and dry.     Capillary Refill: Capillary refill takes less than 2 seconds.  Neurological:     General: No focal deficit present.     Mental Status: She is alert and oriented to person, place, and time.     Cranial Nerves: No cranial nerve deficit.     Sensory: No sensory deficit.     Motor: No weakness.     Gait: Gait normal.  Psychiatric:        Mood and Affect: Mood normal.    ED Results / Procedures / Treatments   Labs (all labs ordered are listed, but only abnormal results are displayed) Labs Reviewed  URINALYSIS, ROUTINE W REFLEX MICROSCOPIC - Abnormal; Notable for the following components:      Result Value   Color, Urine STRAW (*)    Hgb urine dipstick SMALL (*)    Bacteria, UA MANY (*)    All other components within normal limits  BASIC METABOLIC PANEL  CBC WITH DIFFERENTIAL/PLATELET    EKG EKG Interpretation  Date/Time:  Wednesday March 26 2021 18:34:04 EST Ventricular Rate:  53 PR Interval:  203 QRS Duration: 140 QT Interval:  513 QTC Calculation: 482 R Axis:   -11 Text Interpretation: Sinus rhythm Left bundle branch block No significant change since last tracing Confirmed by Aletta Edouard [phone]) on 03/26/2021 6:36:55 PM  Radiology No results found.  Procedures Procedures    Medications Ordered in ED Medications - No data to display  ED Course/ Medical Decision Making/ A&P Clinical Course as of 03/27/21 1027  Wed Mar 26, 2021  1902 [employer]ics unremarkable.  Patient states dizziness is resolved.  Reviewed  work-up with her.  She is comfortable plan for outpatient follow-up with her PCP and neurology. [MB]    Clinical Course User Index [MB] Hayden Rasmussen, MD                           Medical Decision Making Amount and/or Complexity of Data Reviewed Labs: ordered.  This patient complains of dizziness and blood in her urine; this involves an extensive number of treatment Options and is a complaint that carries with it a high risk of complications and Morbidity. The differential  includes hematuria, infection, renal colic, medication side effect, anemia  I ordered, reviewed and interpreted labs, which included CBC with normal hemoglobin, chemistries normal, urinalysis without significant hematuria or signs of infection Additional history obtained from patient significant other Previous records obtained and reviewed in epic including recent admission for possible TIA  After the interventions stated above, I reevaluated the patient and found patient be symptomatically improved.  No obvious signs of infection.  Recommended continuing current medications and following up with her outpatient providers.  Return instructions discussed.  No indications for admission at this time          Final Clinical Impression(s) / ED Diagnoses Final diagnoses:  Hematuria, unspecified type  Dizziness    Rx / DC Orders ED Discharge Orders     None         Hayden Rasmussen, MD 03/27/21 1028

## 2021-04-01 ENCOUNTER — Ambulatory Visit: Payer: BC Managed Care – PPO | Admitting: Family Medicine

## 2021-06-25 ENCOUNTER — Encounter: Payer: Self-pay | Admitting: Family Medicine

## 2021-06-25 ENCOUNTER — Ambulatory Visit: Payer: BC Managed Care – PPO | Admitting: Family Medicine

## 2021-06-25 VITALS — BP 126/70 | HR 70 | Ht 64.0 in | Wt 178.6 lb

## 2021-06-25 DIAGNOSIS — H8110 Benign paroxysmal vertigo, unspecified ear: Secondary | ICD-10-CM

## 2021-06-25 DIAGNOSIS — R42 Dizziness and giddiness: Secondary | ICD-10-CM | POA: Diagnosis not present

## 2021-06-25 MED ORDER — MECLIZINE HCL 25 MG PO TABS: 25.0000 mg | ORAL_TABLET | Freq: Three times a day (TID) | ORAL | 0 refills | Status: DC | PRN

## 2021-06-25 NOTE — Assessment & Plan Note (Signed)
Prescribed Meclizine for dizziness ?Encouraged to stay hydrated ?

## 2021-06-25 NOTE — Patient Instructions (Addendum)
I appreciate the opportunity to provide care to you today!    Please pick up your mediation at the pharmacy   Please continue to a heart-healthy diet and increase your physical activities. Try to exercise for 30mins at least three times a week.      It was a pleasure to see you and I look forward to continuing to work together on your health and well-being. Please do not hesitate to call the office if you need care or have questions about your care.   Have a wonderful day and week. With Gratitude, Jenean Escandon MSN, FNP-BC  

## 2021-06-25 NOTE — Progress Notes (Signed)
? ?Established Patient Office Visit ? ?Subjective:  ?Patient ID: Kristin Blackwell, female    DOB: Sep 27, 1950  Age: 71 y.o. MRN: [id number] ? ?CC:  ?Chief Complaint  ?Patient presents with  ? Dizziness  ?  Complains of feeling lightheaded since 06/23/2021, has been feeling dizzy especially when she stands, also feels nauseous.   ? ? ?HPI ?Kristin Blackwell is a 71 y.o. female with past medical history of TIA, allergic rhinitis, GERD presents with complaints of lightheadedness and dizziness since 06/23/21. She reports feeling lightheaded and dizzy with positional changes from lying to sitting and sitting to standing. She shares that she had vertigo and denies ringing in the ears and nystagmus. She reports being nauseous yesterday and today. Her orthostatic BP is normal in the office. She notes that she gets lightheaded when she wakes up to use the bathroom at 2 am, and symptoms usually improve during the day. She reports missing work for two days due to her dizziness.  ? ? ?Past Medical History:  ?Diagnosis Date  ? Hyperlipidemia   ? Nicotine addiction   ? Overweight(278.02)   ? ? ?Past Surgical History:  ?Procedure Laterality Date  ? PARTIAL HYSTERECTOMY  1987  ? had one ovary removed   ? ? ?Family History  ?Problem Relation Age of Onset  ? Hypertension Mother   ? Emphysema Father   ? Hypertension Sister   ? Diabetes Brother   ?     oldest   ? ? ?Social History  ? ?Socioeconomic History  ? Marital status: Widowed  ?  Spouse name: Not on file  ? Number of children: Not on file  ? Years of education: Not on file  ? Highest education level: Not on file  ?Occupational History  ? Occupation: Licensed conveyancer at [employer]   ?Tobacco Use  ? Smoking status: Former  ?  Packs/day: 1.00  ?  Years: 30.00  ?  Pack years: 30.00  ?  Types: Cigarettes  ?  Quit date: 2014  ?  Years since quitting: 9.3  ? Smokeless tobacco: Never  ?Vaping Use  ? Vaping Use: Never used  ?Substance and Sexual Activity  ? Alcohol use: Yes  ?  Comment:  occassionally  ? Drug use: No  ? Sexual activity: Not on file  ?Other Topics Concern  ? Not on file  ?Social History Narrative  ? Cell [phone]  ? ?Social Determinants of Health  ? ?Financial Resource Strain: Not on file  ?Food Insecurity: Not on file  ?Transportation Needs: Not on file  ?Physical Activity: Not on file  ?Stress: Not on file  ?Social Connections: Not on file  ?Intimate Partner Violence: Not on file  ? ? ?Outpatient Medications Prior to Visit  ?Medication Sig Dispense Refill  ? aspirin EC 81 MG EC tablet Take 1 tablet (81 mg total) by mouth daily. Swallow whole. 30 tablet 11  ? Cholecalciferol (VITAMIN D) 50 MCG (2000 UT) CAPS Take 1 capsule by mouth daily.    ? cyanocobalamin (,VITAMIN B-12,) 1000 MCG/ML injection Indurated at 1000 mcg daily x3 days; then weekly x1 month and then monthly after that. (Patient taking differently: Indurated at 1000 mcg daily x 3 days; then weekly x1 month on Mondays and then monthly after that.) 11 mL 0  ? metFORMIN (GLUCOPHAGE XR) 500 MG 24 hr tablet Take 1 tablet (500 mg total) by mouth daily with breakfast. 30 tablet 3  ? Multiple Vitamins-Minerals (HAIR/SKIN/NAILS/BIOTIN PO) Take 1 capsule by mouth daily.    ?  rosuvastatin (CRESTOR) 10 MG tablet TAKE ONE TABLET EVERY EVENING ON MONDAY, WEDNESDAY AND FRIDAY (Patient taking differently: Take 10 mg by mouth See admin instructions. Take one tablet every evening on Monday, Wednesday and Friday) 36 tablet 4  ? zinc gluconate 50 MG tablet Take 50 mg by mouth daily.    ? pantoprazole (PROTONIX) 40 MG tablet Take 1 tablet (40 mg total) by mouth daily for 20 days. 20 tablet 0  ? ?No facility-administered medications prior to visit.  ? ? ?Allergies  ?Allergen Reactions  ? Ibandronate Sodium   ? Lipitor [Atorvastatin]   ?  Muscle aches  ? Pravastatin   ?  Muscle aches  ? ? ?ROS ?Review of Systems  ?Constitutional:  Positive for fatigue. Negative for chills and fever.  ?HENT:  Negative for rhinorrhea, sinus pressure and sinus  pain.   ?Eyes:  Negative for photophobia, pain and visual disturbance.  ?Respiratory:  Negative for chest tightness and shortness of breath.   ?Cardiovascular:  Negative for chest pain.  ?Gastrointestinal:  Positive for nausea. Negative for diarrhea and vomiting.  ?Musculoskeletal:  Positive for back pain.  ?Skin:  Negative for rash and wound.  ?Neurological:  Positive for dizziness and light-headedness. Negative for syncope.  ?Hematological:  Does not bruise/bleed easily.  ?Psychiatric/Behavioral:  Negative for confusion.   ? ?  ?Objective:  ?  ?Physical Exam ?HENT:  ?   Head: Normocephalic.  ?   Right Ear: External ear normal.  ?   Left Ear: External ear normal.  ?   Nose: Nose normal.  ?   Mouth/Throat:  ?   Mouth: Mucous membranes are moist.  ?Cardiovascular:  ?   Rate and Rhythm: Normal rate and regular rhythm.  ?   Pulses: Normal pulses.  ?   Heart sounds: Normal heart sounds.  ?Pulmonary:  ?   Effort: Pulmonary effort is normal.  ?   Breath sounds: Normal breath sounds.  ?Musculoskeletal:  ?   Cervical back: Normal range of motion. No rigidity.  ?Skin: ?   General: Skin is warm.  ?Neurological:  ?   Mental Status: She is alert and oriented to person, place, and time.  ?Psychiatric:     ?   Behavior: Behavior normal.  ? ? ?BP 126/70   Pulse 70   Ht $R'5\' 4"'vB$  (1.626 m)   Wt 178 lb 9.6 oz (81 kg)   SpO2 98%   BMI 30.66 kg/m?  ?Wt Readings from Last 3 Encounters:  ?06/25/21 178 lb 9.6 oz (81 kg)  ?03/26/21 178 lb (80.7 kg)  ?03/21/21 181 lb (82.1 kg)  ? ? ?Lab Results  ?Component Value Date  ? TSH 1.673 03/19/2021  ? ?Lab Results  ?Component Value Date  ? WBC 7.6 03/26/2021  ? HGB 12.5 03/26/2021  ? HCT 39.7 03/26/2021  ? MCV 87.4 03/26/2021  ? PLT 310 03/26/2021  ? ?Lab Results  ?Component Value Date  ? NA 135 03/26/2021  ? K 3.7 03/26/2021  ? CO2 22 03/26/2021  ? GLUCOSE 86 03/26/2021  ? BUN 16 03/26/2021  ? CREATININE 0.75 03/26/2021  ? BILITOT 0.2 (L) 03/19/2021  ? ALKPHOS 68 03/19/2021  ? AST 26 03/19/2021   ? ALT 21 03/19/2021  ? PROT 7.4 03/19/2021  ? ALBUMIN 3.7 03/19/2021  ? CALCIUM 9.4 03/26/2021  ? ANIONGAP 10 03/26/2021  ? EGFR 65 03/21/2021  ? ?Lab Results  ?Component Value Date  ? CHOL 149 03/19/2021  ? ?Lab Results  ?Component Value Date  ?  HDL 46 03/19/2021  ? ?Lab Results  ?Component Value Date  ? [place] 89 03/19/2021  ? ?Lab Results  ?Component Value Date  ? TRIG 72 03/19/2021  ? ?Lab Results  ?Component Value Date  ? CHOLHDL 3.2 03/19/2021  ? ?Lab Results  ?Component Value Date  ? HGBA1C 6.4 (H) 03/19/2021  ? ? ?  ?Assessment & Plan:  ? ?Problem List Items Addressed This Visit   ? ?  ? Other  ? Dizziness  ?  Prescribed Meclizine for dizziness ?Encouraged to stay hydrated ? ?  ?  ? ?Other Visit Diagnoses   ? ? Benign paroxysmal positional vertigo, unspecified laterality    -  Primary  ? Relevant Medications  ? meclizine (ANTIVERT) 25 MG tablet  ? ?  ? ? ?Meds ordered this encounter  ?Medications  ? meclizine (ANTIVERT) 25 MG tablet  ?  Sig: Take 1 tablet (25 mg total) by mouth 3 (three) times daily as needed for dizziness.  ?  Dispense:  30 tablet  ?  Refill:  0  ? ? ?Follow-up: No follow-ups on file.  ? ? ?Alvira Monday, FNP ?

## 2021-06-26 ENCOUNTER — Ambulatory Visit: Payer: BC Managed Care – PPO | Admitting: Family Medicine

## 2021-07-18 ENCOUNTER — Other Ambulatory Visit: Payer: Self-pay

## 2021-07-18 LAB — CMP14+EGFR
ALT: 16 IU/L (ref 0–32)
AST: 21 IU/L (ref 0–40)
Albumin/Globulin Ratio: 1.4 (ref 1.2–2.2)
Albumin: 4.4 g/dL (ref 3.8–4.8)
Alkaline Phosphatase: 80 IU/L (ref 44–121)
BUN/Creatinine Ratio: 18 (ref 12–28)
BUN: 14 mg/dL (ref 8–27)
Bilirubin Total: 0.3 mg/dL (ref 0.0–1.2)
CO2: 23 mmol/L (ref 20–29)
Calcium: 9.6 mg/dL (ref 8.7–10.3)
Chloride: 103 mmol/L (ref 96–106)
Creatinine, Ser: 0.8 mg/dL (ref 0.57–1.00)
Globulin, Total: 3.2 g/dL (ref 1.5–4.5)
Glucose: 110 mg/dL — ABNORMAL HIGH (ref 70–99)
Potassium: 4.2 mmol/L (ref 3.5–5.2)
Sodium: 141 mmol/L (ref 134–144)
Total Protein: 7.6 g/dL (ref 6.0–8.5)
eGFR: 79 mL/min/{1.73_m2} (ref >59)

## 2021-07-18 LAB — HEMOGLOBIN A1C
Est. average glucose Bld gHb Est-mCnc: 137 mg/dL
Hgb A1c MFr Bld: 6.4 % — ABNORMAL HIGH (ref 4.8–5.6)

## 2021-07-18 LAB — LIPID PANEL
Chol/HDL Ratio: 3 ratio (ref 0.0–4.4)
Cholesterol, Total: 161 mg/dL (ref 100–199)
HDL: 53 mg/dL (ref >39)
LDL Chol Calc (NIH): 90 mg/dL (ref 0–99)
Triglycerides: 101 mg/dL (ref 0–149)
VLDL Cholesterol Cal: 18 mg/dL (ref 5–40)

## 2021-07-18 MED ORDER — METFORMIN HCL ER 500 MG PO TB24: 500.0000 mg | ORAL_TABLET | Freq: Every day | ORAL | 3 refills | Status: DC

## 2021-07-18 NOTE — Progress Notes (Signed)
Labs are stable, patient should continue current medications and follow-up with Dr. Moshe Cipro as planned

## 2021-07-22 ENCOUNTER — Encounter: Payer: Self-pay | Admitting: Family Medicine

## 2021-07-22 ENCOUNTER — Ambulatory Visit: Payer: BC Managed Care – PPO | Admitting: Family Medicine

## 2021-07-22 VITALS — BP 126/65 | HR 70 | Resp 16 | Ht 65.0 in | Wt 177.0 lb

## 2021-07-22 DIAGNOSIS — R0609 Other forms of dyspnea: Secondary | ICD-10-CM | POA: Diagnosis not present

## 2021-07-22 DIAGNOSIS — R7303 Prediabetes: Secondary | ICD-10-CM

## 2021-07-22 DIAGNOSIS — R002 Palpitations: Secondary | ICD-10-CM | POA: Diagnosis not present

## 2021-07-22 DIAGNOSIS — E785 Hyperlipidemia, unspecified: Secondary | ICD-10-CM | POA: Diagnosis not present

## 2021-07-22 DIAGNOSIS — D539 Nutritional anemia, unspecified: Secondary | ICD-10-CM

## 2021-07-22 DIAGNOSIS — H43392 Other vitreous opacities, left eye: Secondary | ICD-10-CM

## 2021-07-22 DIAGNOSIS — G4733 Obstructive sleep apnea (adult) (pediatric): Secondary | ICD-10-CM | POA: Diagnosis not present

## 2021-07-22 NOTE — Patient Instructions (Addendum)
F/Un in  5 months, call if you need me sooner  You are referred to Cardiology due to new onset shortness of breath, and palpitations  Please try to get new CPAP supplies you do NEED to use it every night, this will protect your heart , lungs and brain, you will be referred for f/u with Pulmionary due in 09/2021, can discuss the issues you are having with equipment  Take OTC vit B12 , 1000 mcg daily  Nurse please send  for mammogram reports from [facility] where she gets them done  Pls add B12 level to recent  labs, if not able  please order B12  with fasting labs for 5 month f/u lipid, cmp and eGFr, hBA1C   Nurse please refer for soonest availabel ey appt for her left eye floater x 1 week, if loss of vision with this needs ED eval  It is important that you exercise regularly at least 30 minutes 5 times a week. If you develop chest pain, have severe difficulty breathing, or feel very tired, stop exercising immediately and seek medical attention    Thanks for choosing [facility], we consider it a privelige to serve you.

## 2021-07-22 NOTE — Progress Notes (Signed)
   Kristin Blackwell     MRN: [id number]      DOB: April 23, 1950   HPI Kristin Blackwell is here for follow up and re-evaluation of chronic medical conditions, medication management and review of any available recent lab and radiology data.  Preventive health is updated, specifically  Cancer screening and Immunization.   Questions or concerns regarding consultations or procedures which the PT has had in the interim are  addressed. The PT denies any adverse reactions to current medications since the last visit.  2 month h/o palpitations intermittently, chest discomfort, light headedness, poor exercise tolerance C/o inability to use CPAP equipment, has upcoming appt due with Pulmonary, needs to address these concerns, not as compliant as she should be due to issues with the equipment  ROS Denies recent fever or chills. Denies sinus pressure, nasal congestion, ear pain or sore throat. Denies chest congestion, productive cough or wheezing. Denies chest pains, palpitations and leg swelling Denies abdominal pain, nausea, vomiting,diarrhea or constipation.   Denies dysuria, frequency, hesitancy or incontinence. Denies joint pain, swelling and limitation in mobility. Denies headaches, seizures, numbness, or tingling. Denies depression, anxiety or insomnia. Denies skin break down or rash.   PE  BP 126/65   Pulse 70   Resp 16   Ht '5\' 5"'$  (1.651 m)   Wt 177 lb (80.3 kg)   SpO2 97%   BMI 29.45 kg/m   Patient alert and oriented and in no cardiopulmonary distress.  HEENT: No facial asymmetry, EOMI,     Neck supple .  Chest: Clear to auscultation bilaterally.  CVS: S1, S2 no murmurs, no S3.Regular rate.  ABD: Soft non tender.   Ext: No edema  MS: Adequate ROM spine, shoulders, hips and knees.  Skin: Intact, no ulcerations or rash noted.  Psych: Good eye contact, normal affect. Memory intact not anxious or depressed appearing.  CNS: CN 2-12 intact, power,  normal throughout.no focal  deficits noted.   Assessment & Plan  Palpitations approx 8 weeks h/o increased frequency and duration of palpitations, refer to Cardiology for eval and management  Hyperlipidemia LDL goal <100 Hyperlipidemia:Low fat diet discussed and encouraged.   Lipid Panel  Lab Results  Component Value Date   CHOL 161 07/17/2021   HDL 53 07/17/2021   LDLCALC 90 07/17/2021   TRIG 101 07/17/2021   CHOLHDL 3.0 07/17/2021     Controlled, no change in medication   Obstructive sleep apnea Reports poor fit and hence non compliance,needs to discuss treatment options with Pulmoary

## 2021-07-28 ENCOUNTER — Encounter: Payer: Self-pay | Admitting: Family Medicine

## 2021-07-28 DIAGNOSIS — R002 Palpitations: Secondary | ICD-10-CM | POA: Insufficient documentation

## 2021-07-28 NOTE — Assessment & Plan Note (Signed)
Hyperlipidemia:Low fat diet discussed and encouraged.   Lipid Panel  Lab Results  Component Value Date   CHOL 161 07/17/2021   HDL 53 07/17/2021   LDLCALC 90 07/17/2021   TRIG 101 07/17/2021   CHOLHDL 3.0 07/17/2021     Controlled, no change in medication

## 2021-07-28 NOTE — Assessment & Plan Note (Signed)
Reports poor fit and hence non compliance,needs to discuss treatment options with Pulmoary

## 2021-07-28 NOTE — Assessment & Plan Note (Signed)
approx 8 weeks h/o increased frequency and duration of palpitations, refer to Cardiology for eval and management

## 2021-07-29 ENCOUNTER — Telehealth: Payer: Self-pay | Admitting: Pulmonary Disease

## 2021-07-29 NOTE — Telephone Encounter (Signed)
Referral was put in to see Dr. Halford Chessman due to pt having issues with CPAP.  OV notes from Dr. Moshe Cipro states mask not fitting correctly. Patient is due for a 1 year follow up in December 2023.  Called and spoke to patient about her cpap. She states sometimes her mask is not fitting correctly but sometimes it does. She states she is back to using it after talking to Dr. Moshe Cipro.  Patient states she wants to talk to the RT at [facility] before having any orders placed or alerting Dr. Halford Chessman. She states she will give our office a call if she needs or anything but as of now she states she has been compliant since seeing Dr. Moshe Cipro.  Nothing further needed at this time.

## 2021-08-28 ENCOUNTER — Ambulatory Visit (INDEPENDENT_AMBULATORY_CARE_PROVIDER_SITE_OTHER): Payer: BC Managed Care – PPO

## 2021-08-28 ENCOUNTER — Ambulatory Visit: Payer: BC Managed Care – PPO | Admitting: Cardiology

## 2021-08-28 ENCOUNTER — Encounter: Payer: Self-pay | Admitting: Cardiology

## 2021-08-28 VITALS — BP 112/60 | HR 70 | Ht 65.0 in | Wt 176.0 lb

## 2021-08-28 DIAGNOSIS — I447 Left bundle-branch block, unspecified: Secondary | ICD-10-CM

## 2021-08-28 DIAGNOSIS — R002 Palpitations: Secondary | ICD-10-CM | POA: Diagnosis not present

## 2021-08-28 DIAGNOSIS — R072 Precordial pain: Secondary | ICD-10-CM

## 2021-08-28 DIAGNOSIS — E785 Hyperlipidemia, unspecified: Secondary | ICD-10-CM

## 2021-08-28 DIAGNOSIS — Z01812 Encounter for preprocedural laboratory examination: Secondary | ICD-10-CM | POA: Diagnosis not present

## 2021-08-28 DIAGNOSIS — G4733 Obstructive sleep apnea (adult) (pediatric): Secondary | ICD-10-CM

## 2021-08-28 MED ORDER — METOPROLOL TARTRATE 100 MG PO TABS: 100.0000 mg | ORAL_TABLET | ORAL | 0 refills | Status: DC

## 2021-08-28 NOTE — Assessment & Plan Note (Addendum)
Comes and goes. This week was worse.  We will check a ZIO monitor to make sure she does not have any evidence of atrial fibrillation.  No high risk symptoms such as syncope.

## 2021-08-28 NOTE — Assessment & Plan Note (Signed)
Uses CPAP 

## 2021-08-28 NOTE — Progress Notes (Unsigned)
Enrolled for [employer] to mail a ZIO XT long term holter monitor to the patients address on file.  

## 2021-08-28 NOTE — Progress Notes (Signed)
Cardiology Office Note:    Date:  08/28/2021   ID:  MARYKAY MCCLEOD, DOB 06/30/1950, MRN [id number]  PCP:  Fayrene Helper, MD   [facility] Providers Cardiologist:  None     Referring MD: Fayrene Helper, MD   History of Present Illness:    Kristin Blackwell is a 71 y.o. female here for the evaluation of palpitations and exertional dyspnea at the request of Dr. Moshe Cipro.  She saw Dr. Moshe Cipro on 07/22/2021 and reported intermittent palpitations, chest discomfort, lightheadedness, and poor exercise tolerance over 2 months. She was unable to use her CPAP and advised to address this with upcoming Pulmonary appointment. Her palpitations were becoming more frequent and had longer durations, so she was referred to cardiology for further evaluation.  Previously seen by Dr. Acie Fredrickson 01/2012 for evaluation of vague, atypical chest pains and LBBB. She was recommended for an adenosine Myoview study 02/2012 which was normal. No follow-up evaluation was indicated.  Today: She confirms her main complaint today is her palpitations. They seem to be randomly intermittent, sometimes a few weeks between episodes. She has also noticed some chest tightness the other night. She woke up and had discovered that her CPAP was off due to power failure. She believes the CPAP has been the cause of her symptoms as their onset correlated with obtaining the CPAP.  On 03/19/21 she presented to the hospital due to her chest discomfort. She was kept overnight for observation.  She endorses borderline diabetes, currently on metformin.  She is a former smoker; she quit smoking in 2014.  Her mother also has heart palpitations, currently 71 yo.  She denies any shortness of breath, or peripheral edema. No lightheadedness, headaches, syncope, orthopnea, or PND.   Past Medical History:  Diagnosis Date   Hyperlipidemia    Nicotine addiction    Overweight(278.02)     Past Surgical History:  Procedure Laterality  Date   PARTIAL HYSTERECTOMY  1987   had one ovary removed     Current Medications: Current Meds  Medication Sig   aspirin EC 81 MG EC tablet Take 1 tablet (81 mg total) by mouth daily. Swallow whole.   Cholecalciferol (VITAMIN D) 50 MCG (2000 UT) CAPS Take 1 capsule by mouth daily.   cyanocobalamin 1000 MCG tablet Take 1,000 mcg by mouth daily.   meclizine (ANTIVERT) 25 MG tablet Take 1 tablet (25 mg total) by mouth 3 (three) times daily as needed for dizziness.   metFORMIN (GLUCOPHAGE XR) 500 MG 24 hr tablet Take 1 tablet (500 mg total) by mouth daily with breakfast.   metoprolol tartrate (LOPRESSOR) 100 MG tablet Take 1 tablet (100 mg total) by mouth as directed. Take 1 tablet 2 hours before your CT scan   Multiple Vitamins-Minerals (HAIR/SKIN/NAILS/BIOTIN PO) Take 1 capsule by mouth daily.   rosuvastatin (CRESTOR) 10 MG tablet TAKE ONE TABLET EVERY EVENING ON MONDAY, WEDNESDAY AND FRIDAY   zinc gluconate 50 MG tablet Take 50 mg by mouth daily.     Allergies:   Ibandronate sodium, Lipitor [atorvastatin], and Pravastatin   Social History   Socioeconomic History   Marital status: Widowed    Spouse name: Not on file   Number of children: Not on file   Years of education: Not on file   Highest education level: Not on file  Occupational History   Occupation: Librarian at [employer]   Tobacco Use   Smoking status: Former    Packs/day: 1.00    Years: 30.00  Total pack years: 30.00    Types: Cigarettes    Quit date: 2014    Years since quitting: 9.5   Smokeless tobacco: Never  Vaping Use   Vaping Use: Never used  Substance and Sexual Activity   Alcohol use: Yes    Comment: occassionally   Drug use: No   Sexual activity: Not on file  Other Topics Concern   Not on file  Social History Narrative   Cell [phone]   Social Determinants of Health   Financial Resource Strain: Not on file  Food Insecurity: Not on file  Transportation Needs: Not on file  Physical  Activity: Not on file  Stress: Not on file  Social Connections: Not on file     Family History: The patient's family history includes Diabetes in her brother; Emphysema in her father; Hypertension in her mother and sister.  ROS:   Please see the history of present illness.    (+) Palpitations (+) Chest tightness All other systems reviewed and are negative.  EKGs/Labs/Other Studies Reviewed:    The following studies were reviewed today:  CTA Neck 03/19/2021: FINDINGS: Skeleton: Widespread cervical spine disc and endplate degeneration. No acute osseous abnormality identified.   Upper chest: Negative.   Other neck: The glottis is closed. Otherwise negative neck soft tissues.   Aortic arch: 3 vessel arch configuration with no arch atherosclerosis. Generalized tortuosity of the proximal great vessels.   Right carotid system: Tortuous brachiocephalic artery and right CCA origin without plaque or stenosis. Negative right CCA and right carotid bifurcation. Mildly tortuous right ICA below the skull base without plaque or stenosis.   Visible right ICA siphon is patent.   Left carotid system: Mild tortuosity. Negative left CCA. Minimal soft and calcified plaque at the left ICA bulb without stenosis. Visible left ICA siphon is patent.   Vertebral arteries: Tortuous proximal right subclavian artery and normal right vertebral artery origin with no plaque or stenosis. Right vertebral artery is tortuous in the neck but patent to the vertebrobasilar junction without plaque or stenosis. Normal right PICA origin.   Visible proximal basilar artery is patent.   Mildly tortuous proximal left subclavian artery and normal left vertebral artery origin. Mild V1 segment tortuosity. Codominant left vertebral artery is patent to the vertebrobasilar junction without plaque or stenosis.   Anatomic variants: None.   Review of the MIP images confirms the above findings   IMPRESSION: 1.  Generalized arterial tortuosity with minimal atherosclerosis in the neck. No extracranial arterial stenosis. 2. 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  Echocardiogram  03/19/2021:  1. Left ventricular ejection fraction, by estimation, is 60 to 65%. The  left ventricle has normal function. The left ventricle has no regional  wall motion abnormalities. Left ventricular diastolic parameters are  consistent with Grade I diastolic  dysfunction (impaired relaxation).   2. Right ventricular systolic function is normal. The right ventricular  size is normal. Tricuspid regurgitation signal is inadequate for assessing  PA pressure.   3. The mitral valve is normal in structure. No evidence of mitral valve  regurgitation. No evidence of mitral stenosis.   4. The aortic valve is tricuspid. Aortic valve regurgitation is not  visualized. No aortic stenosis is present.   5. The inferior vena cava is normal in size with greater than 50%  respiratory variability, suggesting right atrial pressure of 3 mmHg.     EKG:  EKG is personally reviewed and interpreted. 08/28/2021:  EKG was not ordered.   Recent  Labs: 03/19/2021: TSH 1.673 03/26/2021: Hemoglobin 12.5; Platelets 310 07/17/2021: ALT 16; BUN 14; Creatinine, Ser 0.80; Potassium 4.2; Sodium 141   Recent Lipid Panel    Component Value Date/Time   CHOL 161 07/17/2021 0820   TRIG 101 07/17/2021 0820   HDL 53 07/17/2021 0820   CHOLHDL 3.0 07/17/2021 0820   CHOLHDL 3.2 03/19/2021 0841   VLDL 14 03/19/2021 0841   LDLCALC 90 07/17/2021 0820   LDLCALC 98 03/30/2019 1006     Risk Assessment/Calculations:          Physical Exam:    VS:  BP 112/60 (BP Location: Left Arm, Patient Position: Sitting, Cuff Size: Normal)   Pulse 70   Ht '5\' 5"'$  (1.651 m)   Wt 176 lb (79.8 kg)   SpO2 97%   BMI 29.29 kg/m     Wt Readings from Last 3 Encounters:  08/28/21 176 lb (79.8 kg)  07/22/21 177 lb (80.3 kg)  06/25/21 178 lb 9.6 oz (81 kg)      GEN: Well nourished, well developed in no acute distress HEENT: Normal NECK: No JVD; No carotid bruits LYMPHATICS: No lymphadenopathy CARDIAC: RRR, no murmurs, rubs, gallops RESPIRATORY:  Clear to auscultation without rales, wheezing or rhonchi  ABDOMEN: Soft, non-tender, non-distended MUSCULOSKELETAL:  No edema; No deformity  SKIN: Warm and dry NEUROLOGIC:  Alert and oriented x 3 PSYCHIATRIC:  Normal affect   ASSESSMENT:    1. Pre-procedure lab exam   2. Palpitations   3. OSA (obstructive sleep apnea)   4. Precordial chest pain   5. LBBB (left bundle branch block)   6. Hyperlipidemia LDL goal <100    PLAN:    In order of problems listed above:  Palpitations Comes and goes. This week was worse.  We will check a ZIO monitor to make sure she does not have any evidence of atrial fibrillation.  No high risk symptoms such as syncope.  OSA (obstructive sleep apnea) Uses CPAP  Precordial chest pain Felt some discomfort. Thinks it is from CPAP machine. Has DM (HgA1c 6.4). Crestor '10mg'$  M, W, F  Will check coronary CT.   ECHO 03/2021: Normal EF.  LBBB (left bundle branch block) Chronic left bundle branch block.  Hyperlipidemia LDL goal <100 LDL is 90 currently.  She is on Monday Wednesday Friday rosuvastatin.  10 mg.       Follow-up: PRN. Will follow up with results of testing.  Medication Adjustments/Labs and Tests Ordered: Current medicines are reviewed at length with the patient today.  Concerns regarding medicines are outlined above.   Orders Placed This Encounter  Procedures   CT CORONARY MORPH W/CTA COR W/SCORE W/CA W/CM &/OR WO/CM   Basic metabolic panel   LONG TERM MONITOR (3-14 DAYS)   Meds ordered this encounter  Medications   metoprolol tartrate (LOPRESSOR) 100 MG tablet    Sig: Take 1 tablet (100 mg total) by mouth as directed. Take 1 tablet 2 hours before your CT scan    Dispense:  1 tablet    Refill:  0   Patient Instructions  Medication  Instructions:  The current medical regimen is effective;  continue present plan and medications.  *If you need a refill on your cardiac medications before your next appointment, please call your pharmacy*  Lab Work: Please have blood work today (BMP)  If you have labs (blood work) drawn today and your tests are completely normal, you will receive your results only by: [place] (if you have [place]) OR  A paper copy in the mail If you have any lab test that is abnormal or we need to change your treatment, we will call you to review the results.   Testing/Procedures:   Your cardiac CT will be scheduled at:   [facility] [address] [place], [place] [address] [phone]  Please arrive at the [facility] and Children[facility] Entrance (Entrance C2) of [facility] 30 minutes prior to test start time. You can use the FREE valet parking offered at entrance C (encouraged to control the heart rate for the test)  Proceed to the [facility] Radiology Department (first floor) to check-in and test prep.  All radiology patients and guests should use entrance C2 at [facility], accessed from [facility], even though the hospital's physical address listed is [address]    Please follow these instructions carefully (unless otherwise directed):  On the Night Before the Test: Be sure to Drink plenty of water. Do not consume any caffeinated/decaffeinated beverages or chocolate 12 hours prior to your test. Do not take any antihistamines 12 hours prior to your test.  On the Day of the Test: Drink plenty of water until 1 hour prior to the test. Do not eat any food 4 hours prior to the test. You may take your regular medications prior to the test.  Take metoprolol (Lopressor) two hours prior to test. HOLD Furosemide/Hydrochlorothiazide morning of the test. FEMALES- please wear underwire-free bra if available, avoid dresses & tight  clothing  After the Test: Drink plenty of water. After receiving IV contrast, you may experience a mild flushed feeling. This is normal. On occasion, you may experience a mild rash up to 24 hours after the test. This is not dangerous. If this occurs, you can take Benadryl 25 mg and increase your fluid intake. If you experience trouble breathing, this can be serious. If it is severe call 911 IMMEDIATELY. If it is mild, please call our office. If you take any of these medications: Glipizide/Metformin, Avandament, Glucavance, please do not take 48 hours after completing test unless otherwise instructed.  We will call to schedule your test 2-4 weeks out understanding that some insurance companies will need an authorization prior to the service being performed.   For non-scheduling related questions, please contact the cardiac imaging nurse navigator should you have any questions/concerns: Marchia Bond, Cardiac Imaging Nurse Navigator Gordy Clement, Cardiac Imaging Nurse Navigator [facility] Direct Office Dial: [phone]   For scheduling needs, including cancellations and rescheduling, please call [place], [phone].  ZIO XT- Long Term Monitor Instructions  Your physician has requested you wear a ZIO patch monitor for 14 days.  This is a single patch monitor. [employer] supplies one patch monitor per enrollment. Additional stickers are not available. Please do not apply patch if you will be having a Nuclear Stress Test,  Echocardiogram, Cardiac CT, MRI, or Chest Xray during the period you would be wearing the  monitor. The patch cannot be worn during these tests. You cannot remove and re-apply the  ZIO XT patch monitor.  Your ZIO patch monitor will be mailed 3 day USPS to your address on file. It may take 3-5 days  to receive your monitor after you have been enrolled.  Once you have received your monitor, please review the enclosed instructions. Your monitor   has already been registered assigning a specific monitor serial # to you.  Billing and Patient Assistance Program Information  We  have supplied [employer] with any of your insurance information on file for billing purposes. [employer] offers a sliding scale Patient Assistance Program for patients that do not have  insurance, or whose insurance does not completely cover the cost of the ZIO monitor.  You must apply for the Patient Assistance Program to qualify for this discounted rate.  To apply, please call [employer] at [phone], select option 4, select option 2, ask to apply for  Patient Assistance Program. Theodore Demark will ask your household income, and how many people  are in your household. They will quote your out-of-pocket cost based on that information.  [employer] will also be able to set up a 89-month interest-free payment plan if needed.  Applying the monitor   Shave hair from upper left chest.  Hold abrader disc by orange tab. Rub abrader in 40 strokes over the upper left chest as  indicated in your monitor instructions.  Clean area with 4 enclosed alcohol pads. Let dry.  Apply patch as indicated in monitor instructions. Patch will be placed under collarbone on left  side of chest with arrow pointing upward.  Rub patch adhesive wings for 2 minutes. Remove white label marked "1". Remove the white  label marked "2". Rub patch adhesive wings for 2 additional minutes.  While looking in a mirror, press and release button in center of patch. A small green light will  flash 3-4 times. This will be your only indicator that the monitor has been turned on.  Do not shower for the first 24 hours. You may shower after the first 24 hours.  Press the button if you feel a symptom. You will hear a small click. Record Date, Time and  Symptom in the Patient Logbook.  When you are ready to remove the patch, follow instructions on the last 2 pages of Patient  Logbook. Stick patch monitor onto the last page  of Patient Logbook.  Place Patient Logbook in the blue and white box. Use locking tab on box and tape box closed  securely. The blue and white box has prepaid postage on it. Please place it in the mailbox as  soon as possible. Your physician should have your test results approximately 7 days after the  monitor has been [facility]  Call [facility] [phone] you have questions regarding  your ZIO XT patch monitor. Call them immediately if you see an orange light blinking on your  monitor.  If your monitor falls off in less than 4 days, contact our Monitor department at [phone]  If your monitor becomes loose or falls off after 4 days call [employer] at [phone]  suggestions on securing your monitor  Follow-Up: [facility] you and your health needs are our priority.  As part of our continuing mission to provide you with exceptional heart care, we have created designated Provider Care Teams.  These Care Teams include your primary Cardiologist (physician) and Advanced Practice Providers (APPs -  Physician Assistants and Nurse Practitioners) who all work together to provide you with the care you need, when you need it.  We recommend signing up for the patient portal called "MyChart".  Sign up information is provided on this After Visit Summary.  MyChart is used to connect with patients for Virtual Visits (Telemedicine).  Patients are able to view lab/test results, encounter notes, upcoming appointments, etc.  Non-urgent messages can be sent to your provider as well.   To learn more about what you  can do with MyChart, go to [email or web address].    Your next appointment:   Follow up will be determine after the above testing has been completed.  Important Information About Sugar         I,Mathew Stumpf,acting as a scribe for Candee Furbish, MD.,have documented all relevant documentation on the behalf of Candee Furbish, MD,as directed  by  Candee Furbish, MD while in the presence of Candee Furbish, MD.  I, Candee Furbish, MD, have reviewed all documentation for this visit. The documentation on 08/28/21 for the exam, diagnosis, procedures, and orders are all accurate and complete.   Signed, Candee Furbish, MD  08/28/2021 3:48 PM    [place]

## 2021-08-28 NOTE — Assessment & Plan Note (Signed)
LDL is 90 currently.  She is on Monday Wednesday Friday rosuvastatin.  10 mg.

## 2021-08-28 NOTE — Assessment & Plan Note (Signed)
Felt some discomfort. Thinks it is from CPAP machine. Has DM (HgA1c 6.4). Crestor '10mg'$  M, W, F  Will check coronary CT.   ECHO 03/2021: Normal EF.

## 2021-08-28 NOTE — Patient Instructions (Addendum)
Medication Instructions:  The current medical regimen is effective;  continue present plan and medications.  *If you need a refill on your cardiac medications before your next appointment, please call your pharmacy*  Lab Work: Please have blood work today (BMP)  If you have labs (blood work) drawn today and your tests are completely normal, you will receive your results only by: MyChart Message (if you have MyChart) OR A paper copy in the mail If you have any lab test that is abnormal or we need to change your treatment, we will call you to review the results.   Testing/Procedures:   Your cardiac CT will be scheduled at:   [facility] [address] [place], [place] [address] [phone]  Please arrive at the [facility] and Children's Entrance (Entrance C2) of [facility] 30 minutes prior to test start time. You can use the FREE valet parking offered at entrance C (encouraged to control the heart rate for the test)  Proceed to the [facility] Radiology Department (first floor) to check-in and test prep.  All radiology patients and guests should use entrance C2 at [facility], accessed from [facility], even though the hospital's physical address listed is [address]    Please follow these instructions carefully (unless otherwise directed):  On the Night Before the Test: Be sure to Drink plenty of water. Do not consume any caffeinated/decaffeinated beverages or chocolate 12 hours prior to your test. Do not take any antihistamines 12 hours prior to your test.  On the Day of the Test: Drink plenty of water until 1 hour prior to the test. Do not eat any food 4 hours prior to the test. You may take your regular medications prior to the test.  Take metoprolol (Lopressor) two hours prior to test. HOLD Furosemide/Hydrochlorothiazide morning of the test. FEMALES- please wear underwire-free bra if available, avoid dresses & tight  clothing  After the Test: Drink plenty of water. After receiving IV contrast, you may experience a mild flushed feeling. This is normal. On occasion, you may experience a mild rash up to 24 hours after the test. This is not dangerous. If this occurs, you can take Benadryl 25 mg and increase your fluid intake. If you experience trouble breathing, this can be serious. If it is severe call 911 IMMEDIATELY. If it is mild, please call our office. If you take any of these medications: Glipizide/Metformin, Avandament, Glucavance, please do not take 48 hours after completing test unless otherwise instructed.  We will call to schedule your test 2-4 weeks out understanding that some insurance companies will need an authorization prior to the service being performed.   For non-scheduling related questions, please contact the cardiac imaging nurse navigator should you have any questions/concerns: Marchia Bond, Cardiac Imaging Nurse Navigator Gordy Clement, Cardiac Imaging Nurse Navigator [facility] Direct Office Dial: [phone]   For scheduling needs, including cancellations and rescheduling, please call [place], [phone].  ZIO XT- Long Term Monitor Instructions  Your physician has requested you wear a ZIO patch monitor for 14 days.  This is a single patch monitor. [employer] supplies one patch monitor per enrollment. Additional stickers are not available. Please do not apply patch if you will be having a Nuclear Stress Test,  Echocardiogram, Cardiac CT, MRI, or Chest Xray during the period you would be wearing the  monitor. The patch cannot be worn during these tests. You cannot remove and re-apply the  ZIO  XT patch monitor.  Your ZIO patch monitor will be mailed 3 day USPS to your address on file. It may take 3-5 days  to receive your monitor after you have been enrolled.  Once you have received your monitor, please review the enclosed instructions. Your monitor   has already been registered assigning a specific monitor serial # to you.  Billing and Patient Assistance Program Information  We have supplied [employer] with any of your insurance information on file for billing purposes. [employer] offers a sliding scale Patient Assistance Program for patients that do not have  insurance, or whose insurance does not completely cover the cost of the ZIO monitor.  You must apply for the Patient Assistance Program to qualify for this discounted rate.  To apply, please call [employer] at [phone], select option 4, select option 2, ask to apply for  Patient Assistance Program. Theodore Demark will ask your household income, and how many people  are in your household. They will quote your out-of-pocket cost based on that information.  [employer] will also be able to set up a 27-month interest-free payment plan if needed.  Applying the monitor   Shave hair from upper left chest.  Hold abrader disc by orange tab. Rub abrader in 40 strokes over the upper left chest as  indicated in your monitor instructions.  Clean area with 4 enclosed alcohol pads. Let dry.  Apply patch as indicated in monitor instructions. Patch will be placed under collarbone on left  side of chest with arrow pointing upward.  Rub patch adhesive wings for 2 minutes. Remove white label marked "1". Remove the white  label marked "2". Rub patch adhesive wings for 2 additional minutes.  While looking in a mirror, press and release button in center of patch. A small green light will  flash 3-4 times. This will be your only indicator that the monitor has been turned on.  Do not shower for the first 24 hours. You may shower after the first 24 hours.  Press the button if you feel a symptom. You will hear a small click. Record Date, Time and  Symptom in the Patient Logbook.  When you are ready to remove the patch, follow instructions on the last 2 pages of Patient  Logbook. Stick patch monitor onto the last page  of Patient Logbook.  Place Patient Logbook in the blue and white box. Use locking tab on box and tape box closed  securely. The blue and white box has prepaid postage on it. Please place it in the mailbox as  soon as possible. Your physician should have your test results approximately 7 days after the  monitor has [facility]  Call [employer] [phone] you have questions regarding  your ZIO XT patch monitor. Call them immediately if you see an orange light blinking on your  monitor.  If your monitor falls off in less than 4 days, contact our Monitor department at [phone]  If your monitor becomes loose or falls off after 4 days call [employer] at [phone]  suggestions on securing your monitor  Follow-Up: At [facility] you and your health needs are our priority.  As part of our continuing mission to provide you with exceptional heart care, we have created designated Provider Care Teams.  These Care Teams include your primary Cardiologist (physician) and Advanced Practice Providers (APPs -  Physician Assistants and Nurse Practitioners) who all work together to provide you with the care you  need, when you need it.  We recommend signing up for the patient portal called "MyChart".  Sign up information is provided on this After Visit Summary.  MyChart is used to connect with patients for Virtual Visits (Telemedicine).  Patients are able to view lab/test results, encounter notes, upcoming appointments, etc.  Non-urgent messages can be sent to your provider as well.   To learn more about what you can do with MyChart, go to [email or web address].    Your next appointment:   Follow up will be determine after the above testing has been completed.  Important Information About Sugar

## 2021-08-28 NOTE — Assessment & Plan Note (Signed)
Chronic left bundle branch block.

## 2021-08-29 LAB — BASIC METABOLIC PANEL
BUN/Creatinine Ratio: 14 (ref 12–28)
BUN: 11 mg/dL (ref 8–27)
CO2: 23 mmol/L (ref 20–29)
Calcium: 9.7 mg/dL (ref 8.7–10.3)
Chloride: 106 mmol/L (ref 96–106)
Creatinine, Ser: 0.76 mg/dL (ref 0.57–1.00)
Glucose: 91 mg/dL (ref 70–99)
Potassium: 4 mmol/L (ref 3.5–5.2)
Sodium: 141 mmol/L (ref 134–144)
eGFR: 84 mL/min/{1.73_m2} (ref >59)

## 2021-08-31 DIAGNOSIS — I447 Left bundle-branch block, unspecified: Secondary | ICD-10-CM | POA: Diagnosis not present

## 2021-08-31 DIAGNOSIS — R002 Palpitations: Secondary | ICD-10-CM | POA: Diagnosis not present

## 2021-09-15 ENCOUNTER — Telehealth (HOSPITAL_COMMUNITY): Payer: Self-pay | Admitting: *Deleted

## 2021-09-15 NOTE — Telephone Encounter (Signed)
Reaching out to patient to offer assistance regarding upcoming cardiac imaging study; pt verbalizes understanding of appt date/time, parking situation and where to check in, pre-test NPO status and medications ordered, and verified current allergies; name and call back number provided for further questions should they arise ? ?Kristin Avitia RN Navigator Cardiac Imaging ?[facility] ?[phone] office ?[phone] cell ? ?Patient to take 50mg metoprolol tartrate two hours prior to her cardiac CT scan. She is aware to arrive at 8:30am.  ?

## 2021-09-16 ENCOUNTER — Encounter (HOSPITAL_COMMUNITY): Payer: Self-pay

## 2021-09-16 ENCOUNTER — Ambulatory Visit (HOSPITAL_COMMUNITY)
Admission: RE | Admit: 2021-09-16 | Discharge: 2021-09-16 | Disposition: A | Discharge status: Home / Self Care | Payer: BC Managed Care – PPO | Source: Ambulatory Visit | Attending: Cardiology | Admitting: Cardiology

## 2021-09-16 DIAGNOSIS — R072 Precordial pain: Secondary | ICD-10-CM | POA: Diagnosis not present

## 2021-09-16 MED ORDER — IOHEXOL 350 MG/ML SOLN
100.0000 mL | Freq: Once | INTRAVENOUS | Status: AC | PRN
  Administered 2021-09-16: 100 mL via INTRAVENOUS

## 2021-09-16 MED ORDER — NITROGLYCERIN 0.4 MG SL SUBL
SUBLINGUAL_TABLET | SUBLINGUAL | Status: AC
  Filled 2021-09-16: qty 2

## 2021-09-16 MED ORDER — NITROGLYCERIN 0.4 MG SL SUBL
0.8000 mg | SUBLINGUAL_TABLET | Freq: Once | SUBLINGUAL | Status: AC
  Administered 2021-09-16: 0.8 mg via SUBLINGUAL

## 2021-10-22 ENCOUNTER — Other Ambulatory Visit: Payer: Self-pay

## 2021-10-22 DIAGNOSIS — Z1231 Encounter for screening mammogram for malignant neoplasm of breast: Secondary | ICD-10-CM

## 2021-11-13 ENCOUNTER — Other Ambulatory Visit: Payer: Self-pay | Admitting: Nurse Practitioner

## 2021-11-18 ENCOUNTER — Other Ambulatory Visit: Payer: Self-pay | Admitting: Family Medicine

## 2021-12-20 LAB — CMP14+EGFR
ALT: 11 IU/L (ref 0–32)
AST: 18 IU/L (ref 0–40)
Albumin/Globulin Ratio: 1.5 (ref 1.2–2.2)
Albumin: 4.5 g/dL (ref 3.9–4.9)
Alkaline Phosphatase: 80 IU/L (ref 44–121)
BUN/Creatinine Ratio: 20 (ref 12–28)
BUN: 19 mg/dL (ref 8–27)
Bilirubin Total: 0.3 mg/dL (ref 0.0–1.2)
CO2: 22 mmol/L (ref 20–29)
Calcium: 9.4 mg/dL (ref 8.7–10.3)
Chloride: 103 mmol/L (ref 96–106)
Creatinine, Ser: 0.93 mg/dL (ref 0.57–1.00)
Globulin, Total: 3 g/dL (ref 1.5–4.5)
Glucose: 92 mg/dL (ref 70–99)
Potassium: 4.4 mmol/L (ref 3.5–5.2)
Sodium: 141 mmol/L (ref 134–144)
Total Protein: 7.5 g/dL (ref 6.0–8.5)
eGFR: 66 mL/min/{1.73_m2} (ref >59)

## 2021-12-20 LAB — VITAMIN B12: Vitamin B-12: 1975 pg/mL — ABNORMAL HIGH (ref 232–1245)

## 2021-12-20 LAB — LIPID PANEL
Chol/HDL Ratio: 3.7 ratio (ref 0.0–4.4)
Cholesterol, Total: 165 mg/dL (ref 100–199)
HDL: 45 mg/dL (ref >39)
LDL Chol Calc (NIH): 103 mg/dL — ABNORMAL HIGH (ref 0–99)
Triglycerides: 94 mg/dL (ref 0–149)
VLDL Cholesterol Cal: 17 mg/dL (ref 5–40)

## 2021-12-20 LAB — HEMOGLOBIN A1C
Est. average glucose Bld gHb Est-mCnc: 131 mg/dL
Hgb A1c MFr Bld: 6.2 % — ABNORMAL HIGH (ref 4.8–5.6)

## 2021-12-23 ENCOUNTER — Ambulatory Visit: Payer: BC Managed Care – PPO | Admitting: Family Medicine

## 2021-12-23 ENCOUNTER — Encounter: Payer: Self-pay | Admitting: Family Medicine

## 2021-12-23 VITALS — BP 116/76 | HR 80 | Ht 64.0 in | Wt 179.0 lb

## 2021-12-23 DIAGNOSIS — E538 Deficiency of other specified B group vitamins: Secondary | ICD-10-CM

## 2021-12-23 DIAGNOSIS — E785 Hyperlipidemia, unspecified: Secondary | ICD-10-CM

## 2021-12-23 DIAGNOSIS — M5410 Radiculopathy, site unspecified: Secondary | ICD-10-CM

## 2021-12-23 DIAGNOSIS — E663 Overweight: Secondary | ICD-10-CM

## 2021-12-23 DIAGNOSIS — R7303 Prediabetes: Secondary | ICD-10-CM | POA: Diagnosis not present

## 2021-12-23 DIAGNOSIS — R030 Elevated blood-pressure reading, without diagnosis of hypertension: Secondary | ICD-10-CM

## 2021-12-23 DIAGNOSIS — Z23 Encounter for immunization: Secondary | ICD-10-CM | POA: Diagnosis not present

## 2021-12-23 MED ORDER — ROSUVASTATIN CALCIUM 20 MG PO TABS: 20.0000 mg | ORAL_TABLET | Freq: Every day | ORAL | 3 refills | Status: DC

## 2021-12-23 NOTE — Patient Instructions (Signed)
F./u in 4 .5 months, call if you need me sooner  STOP B12 and metformin  New higher dose of crestor 20 mg daily ,    Please reduce fried and fatty foods  It is important that you exercise regularly at least 30 minutes 5 times a week. If you develop chest pain, have severe difficulty breathing, or feel very tired, stop exercising immediately and seek medical attention   Tylenol for back and right leg pain  Fasting lipid, cmp and EGFr, B12  level and hBA1c 5 days before next appt  High Cholesterol  High cholesterol is a condition in which the blood has high levels of a white, waxy substance similar to fat (cholesterol). The liver makes all the cholesterol that the body needs. The human body needs small amounts of cholesterol to help build cells. A person gets extra or excess cholesterol from the food that he or she eats. The blood carries cholesterol from the liver to the rest of the body. If you have high cholesterol, deposits (plaques) may build up on the walls of your arteries. Arteries are the blood vessels that carry blood away from your heart. These plaques make the arteries narrow and stiff. Cholesterol plaques increase your risk for heart attack and stroke. Work with your health care provider to keep your cholesterol levels in a healthy range. What increases the risk? The following factors may make you more likely to develop this condition: Eating foods that are high in animal fat (saturated fat) or cholesterol. Being overweight. Not getting enough exercise. A family history of high cholesterol (familial hypercholesterolemia). Use of tobacco products. Having diabetes. What are the signs or symptoms? In most cases, high cholesterol does not usually cause any symptoms. In severe cases, very high cholesterol levels can cause: Fatty bumps under the skin (xanthomas). A white or gray ring around the black center (pupil) of the eye. How is this diagnosed? This condition may be  diagnosed based on the results of a blood test. If you are older than 71 years of age, your health care provider may check your cholesterol levels every 4-6 years. You may be checked more often if you have high cholesterol or other risk factors for heart disease. The blood test for cholesterol measures: "Bad" cholesterol, or LDL cholesterol. This is the main type of cholesterol that causes heart disease. The desired level is less than 100 mg/dL (2.59 mmol/L). "Good" cholesterol, or HDL cholesterol. HDL helps protect against heart disease by cleaning the arteries and carrying the LDL to the liver for processing. The desired level for HDL is 60 mg/dL (1.55 mmol/L) or higher. Triglycerides. These are fats that your body can store or burn for energy. The desired level is less than 150 mg/dL (1.69 mmol/L). Total cholesterol. This measures the total amount of cholesterol in your blood and includes LDL, HDL, and triglycerides. The desired level is less than 200 mg/dL (5.17 mmol/L). How is this treated? Treatment for high cholesterol starts with lifestyle changes, such as diet and exercise. Diet changes. You may be asked to eat foods that have more fiber and less saturated fats or added sugar. Lifestyle changes. These may include regular exercise, maintaining a healthy weight, and quitting use of tobacco products. Medicines. These are given when diet and lifestyle changes have not worked. You may be prescribed a statin medicine to help lower your cholesterol levels. Follow these instructions at home: Eating and drinking  Eat a healthy, balanced diet. This diet includes: Daily servings of  a variety of fresh, frozen, or canned fruits and vegetables. Daily servings of whole grain foods that are rich in fiber. Foods that are low in saturated fats and trans fats. These include poultry and fish without skin, lean cuts of meat, and low-fat dairy products. A variety of fish, especially oily fish that contain  omega-3 fatty acids. Aim to eat fish at least 2 times a week. Avoid foods and drinks that have added sugar. Use healthy cooking methods, such as roasting, grilling, broiling, baking, poaching, steaming, and stir-frying. Do not fry your food except for stir-frying. If you drink alcohol: Limit how much you have to: 0-1 drink a day for women who are not pregnant. 0-2 drinks a day for men. Know how much alcohol is in a drink. In the [place] one drink equals one 12 oz bottle of beer (355 mL), one 5 oz glass of wine (148 mL), or one 1 oz glass of hard liquor (44 mL). Lifestyle  Get regular exercise. Aim to exercise for a total of 150 minutes a week. Increase your activity level by doing activities such as gardening, walking, and taking the stairs. Do not use any products that contain nicotine or tobacco. These products include cigarettes, chewing tobacco, and vaping devices, such as e-cigarettes. If you need help quitting, ask your health care provider. General instructions Take over-the-counter and prescription medicines only as told by your health care provider. Keep all follow-up visits. This is important. Where to find more information American Heart Association: www.heart.org National Heart, Lung, and Blood Institute: https://wilson-eaton.com/ Contact a health care provider if: You have trouble achieving or maintaining a healthy diet or weight. You are starting an exercise program. You are unable to stop smoking. Get help right away if: You have chest pain. You have trouble breathing. You have discomfort or pain in your jaw, neck, back, shoulder, or arm. You have any symptoms of a stroke. "BE FAST" is an easy way to remember the main warning signs of a stroke: B - Balance. Signs are dizziness, sudden trouble walking, or loss of balance. E - Eyes. Signs are trouble seeing or a sudden change in vision. F - Face. Signs are sudden weakness or numbness of the face, or the face or eyelid drooping on one  side. A - Arms. Signs are weakness or numbness in an arm. This happens suddenly and usually on one side of the body. S - Speech. Signs are sudden trouble speaking, slurred speech, or trouble understanding what people say. T - Time. Time to call emergency services. Write down what time symptoms started. You have other signs of a stroke, such as: A sudden, severe headache with no known cause. Nausea or vomiting. Seizure. These symptoms may represent a serious problem that is an emergency. Do not wait to see if the symptoms will go away. Get medical help right away. Call your local emergency services (911 in the [place] Do not drive yourself to the hospital. Summary Cholesterol plaques increase your risk for heart attack and stroke. Work with your health care provider to keep your cholesterol levels in a healthy range. Eat a healthy, balanced diet, get regular exercise, and maintain a healthy weight. Do not use any products that contain nicotine or tobacco. These products include cigarettes, chewing tobacco, and vaping devices, such as e-cigarettes. Get help right away if you have any symptoms of a stroke. This information is not intended to replace advice given to you by your health care provider. Make sure you discuss  any questions you have with your health care provider. Document Revised: 09/05/2021 Document Reviewed: 04/08/2020 Elsevier Patient Education  [place].

## 2021-12-23 NOTE — Progress Notes (Signed)
Kristin Blackwell     MRN: [id number]      DOB: [birth year]   HPI Kristin Blackwell is here for follow up and re-evaluation of chronic medical conditions, medication management and review of any available recent lab and radiology data.  Preventive health is updated, specifically  Cancer screening and Immunization.   Questions or concerns regarding consultations or procedures which the PT has had in the interim are  addressed. The PT denies any adverse reactions to current medications since the last visit.  C/o back and right leg pin Has concerns re lab report   ROS Denies recent fever or chills. Denies sinus pressure, nasal congestion, ear pain or sore throat. Denies chest congestion, productive cough or wheezing. Denies chest pains, palpitations and leg swelling Denies abdominal pain, nausea, vomiting,diarrhea or constipation.   Denies dysuria, frequency, hesitancy or incontinence. Denies joint pain, swelling and limitation in mobility. Denies headaches, seizures, numbness, or tingling. Denies depression, anxiety or insomnia. Denies skin break down or rash.   PE  BP 116/76   Pulse 80   Ht '5\' 4"'$  (1.626 m)   Wt 179 lb 0.6 oz (81.2 kg)   SpO2 98%   BMI 30.73 kg/m   Patient alert and oriented and in no cardiopulmonary distress.  HEENT: No facial asymmetry, EOMI,     Neck supple .  Chest: Clear to auscultation bilaterally.  CVS: S1, S2 no murmurs, no S3.Regular rate.  ABD: Soft non tender.   Ext: No edema  MS: Adequate ROM spine, shoulders, hips and knees.  Skin: Intact, no ulcerations or rash noted.  Psych: Good eye contact, normal affect. Memory intact not anxious or depressed appearing.  CNS: CN 2-12 intact, power,  normal throughout.no focal deficits noted.   Assessment & Plan  Back pain with left-sided radiculopathy Advised use Tylenol on top programs as needed.  Hyperlipidemia LDL goal <100 Hyperlipidemia:Low fat diet discussed and encouraged.   Lipid  Panel  Lab Results  Component Value Date   CHOL 165 12/19/2021   HDL 45 12/19/2021   LDLCALC 103 (H) 12/19/2021   TRIG 94 12/19/2021   CHOLHDL 3.7 12/19/2021     Undercorrected not at goal increase Crestor dose to 20 mg daily.  B12 deficiency Overcorrected advised to stop B12 supplements.  Elevated blood pressure reading without diagnosis of hypertension DASH diet and commitment to daily physical activity for a minimum of 30 minutes discussed and encouraged, as a part of hypertension management. The importance of attaining a healthy weight is also discussed.     12/23/2021    3:22 PM 09/16/2021    9:10 AM 09/16/2021    8:34 AM 08/28/2021    2:59 PM 07/22/2021    9:13 AM 06/25/2021   11:00 AM 03/26/2021    7:00 PM  BP/Weight  Systolic BP 427 062 376 283 151 761 607  Diastolic BP 76 76 78 60 65 70 75  Wt. (Lbs) 179.04   176 177 178.6   BMI 30.73 kg/m2   29.29 kg/m2 29.45 kg/m2 30.66 kg/m2      Normal blood pressure recorded.  Overweight (BMI 25.0-29.9)  Patient re-educated about  the importance of commitment to a  minimum of 150 minutes of exercise per week as able.  The importance of healthy food choices with portion control discussed, as well as eating regularly and within a 12 hour window most days. The need to choose "clean , green" food 50 to 75% of the time is discussed, as well  as to make water the primary drink and set a goal of 64 ounces water daily.       12/23/2021    3:22 PM 08/28/2021    2:59 PM 07/22/2021    9:13 AM  Weight /BMI  Weight 179 lb 0.6 oz 176 lb 177 lb  Height '5\' 4"'$  (1.626 m) '5\' 5"'$  (1.651 m) '5\' 5"'$  (1.651 m)  BMI 30.73 kg/m2 29.29 kg/m2 29.45 kg/m2      Prediabetes Patient educated about the importance of limiting  Carbohydrate intake , the need to commit to daily physical activity for a minimum of 30 minutes , and to commit weight loss. The fact that changes in all these areas will reduce or eliminate all together the development of diabetes is  stressed.      Latest Ref Rng & Units 12/19/2021    9:04 AM 08/28/2021    3:44 PM 07/17/2021    8:20 AM 03/26/2021    5:23 PM 03/21/2021    9:48 AM  Diabetic Labs  HbA1c 4.8 - 5.6 % 6.2   6.4     Chol 100 - 199 mg/dL 165   161     HDL >39 mg/dL 45   53     Calc LDL 0 - 99 mg/dL 103   90     Triglycerides 0 - 149 mg/dL 94   101     Creatinine 0.57 - 1.00 mg/dL 0.93  0.76  0.80  0.75  0.94       12/23/2021    3:22 PM 09/16/2021    9:10 AM 09/16/2021    8:34 AM 08/28/2021    2:59 PM 07/22/2021    9:13 AM 06/25/2021   11:00 AM 03/26/2021    7:00 PM  BP/Weight  Systolic BP 088 110 315 945 859 292 446  Diastolic BP 76 76 78 60 65 70 75  Wt. (Lbs) 179.04   176 177 178.6   BMI 30.73 kg/m2   29.29 kg/m2 29.45 kg/m2 30.66 kg/m2        No data to display          Improved start metformin.

## 2021-12-26 ENCOUNTER — Encounter: Payer: Self-pay | Admitting: Family Medicine

## 2021-12-26 NOTE — Assessment & Plan Note (Signed)
Patient educated about the importance of limiting  Carbohydrate intake , the need to commit to daily physical activity for a minimum of 30 minutes , and to commit weight loss. The fact that changes in all these areas will reduce or eliminate all together the development of diabetes is stressed.      Latest Ref Rng & Units 12/19/2021    9:04 AM 08/28/2021    3:44 PM 07/17/2021    8:20 AM 03/26/2021    5:23 PM 03/21/2021    9:48 AM  Diabetic Labs  HbA1c 4.8 - 5.6 % 6.2   6.4     Chol 100 - 199 mg/dL 165   161     HDL >39 mg/dL 45   53     Calc LDL 0 - 99 mg/dL 103   90     Triglycerides 0 - 149 mg/dL 94   101     Creatinine 0.57 - 1.00 mg/dL 0.93  0.76  0.80  0.75  0.94       12/23/2021    3:22 PM 09/16/2021    9:10 AM 09/16/2021    8:34 AM 08/28/2021    2:59 PM 07/22/2021    9:13 AM 06/25/2021   11:00 AM 03/26/2021    7:00 PM  BP/Weight  Systolic BP 578 469 629 528 413 244 010  Diastolic BP 76 76 78 60 65 70 75  Wt. (Lbs) 179.04   176 177 178.6   BMI 30.73 kg/m2   29.29 kg/m2 29.45 kg/m2 30.66 kg/m2        No data to display          Improved start metformin.

## 2021-12-26 NOTE — Assessment & Plan Note (Signed)
  Patient re-educated about  the importance of commitment to a  minimum of 150 minutes of exercise per week as able.  The importance of healthy food choices with portion control discussed, as well as eating regularly and within a 12 hour window most days. The need to choose "clean , green" food 50 to 75% of the time is discussed, as well as to make water the primary drink and set a goal of 64 ounces water daily.       12/23/2021    3:22 PM 08/28/2021    2:59 PM 07/22/2021    9:13 AM  Weight /BMI  Weight 179 lb 0.6 oz 176 lb 177 lb  Height '5\' 4"'$  (1.626 m) '5\' 5"'$  (1.651 m) '5\' 5"'$  (1.651 m)  BMI 30.73 kg/m2 29.29 kg/m2 29.45 kg/m2

## 2021-12-26 NOTE — Assessment & Plan Note (Signed)
Hyperlipidemia:Low fat diet discussed and encouraged.   Lipid Panel  Lab Results  Component Value Date   CHOL 165 12/19/2021   HDL 45 12/19/2021   LDLCALC 103 (H) 12/19/2021   TRIG 94 12/19/2021   CHOLHDL 3.7 12/19/2021     Undercorrected not at goal increase Crestor dose to 20 mg daily.

## 2021-12-26 NOTE — Assessment & Plan Note (Signed)
Advised use Tylenol on top programs as needed.

## 2021-12-26 NOTE — Assessment & Plan Note (Signed)
Overcorrected advised to stop B12 supplements.

## 2021-12-26 NOTE — Assessment & Plan Note (Signed)
DASH diet and commitment to daily physical activity for a minimum of 30 minutes discussed and encouraged, as a part of hypertension management. The importance of attaining a healthy weight is also discussed.     12/23/2021    3:22 PM 09/16/2021    9:10 AM 09/16/2021    8:34 AM 08/28/2021    2:59 PM 07/22/2021    9:13 AM 06/25/2021   11:00 AM 03/26/2021    7:00 PM  BP/Weight  Systolic BP 031 594 585 929 244 628 638  Diastolic BP 76 76 78 60 65 70 75  Wt. (Lbs) 179.04   176 177 178.6   BMI 30.73 kg/m2   29.29 kg/m2 29.45 kg/m2 30.66 kg/m2      Normal blood pressure recorded.

## 2022-02-02 ENCOUNTER — Telehealth: Payer: Self-pay | Admitting: Family Medicine

## 2022-02-02 NOTE — Telephone Encounter (Signed)
Patient called asked if nurse will give her a call, said she never has to have a phone visit or appt to get meds called into her pharmacy for a cold / congestion.  Patient has a phone visit tomorrow if something is called in she said she will need to cancel this appt for 12.19.2023. Please contact patient either way meds or not.  Pharmacy  [id number] [place]Phone: [phone] Fax: [phone]

## 2022-02-02 NOTE — Telephone Encounter (Signed)
Needs to have a visit (or televisit) before meds can be prescribed.

## 2022-02-03 ENCOUNTER — Encounter: Payer: Self-pay | Admitting: Family Medicine

## 2022-02-03 ENCOUNTER — Ambulatory Visit (INDEPENDENT_AMBULATORY_CARE_PROVIDER_SITE_OTHER): Payer: BC Managed Care – PPO | Admitting: Family Medicine

## 2022-02-03 DIAGNOSIS — J302 Other seasonal allergic rhinitis: Secondary | ICD-10-CM | POA: Diagnosis not present

## 2022-02-03 DIAGNOSIS — R059 Cough, unspecified: Secondary | ICD-10-CM | POA: Insufficient documentation

## 2022-02-03 DIAGNOSIS — R051 Acute cough: Secondary | ICD-10-CM | POA: Diagnosis not present

## 2022-02-03 MED ORDER — PREDNISONE 5 MG PO TABS: 5.0000 mg | ORAL_TABLET | Freq: Two times a day (BID) | ORAL | 0 refills | Status: AC

## 2022-02-03 MED ORDER — FLUTICASONE PROPIONATE 50 MCG/ACT NA SUSP: 2.0000 | Freq: Every day | NASAL | 6 refills | Status: DC

## 2022-02-03 MED ORDER — MONTELUKAST SODIUM 10 MG PO TABS: 10.0000 mg | ORAL_TABLET | Freq: Every day | ORAL | 3 refills | Status: DC

## 2022-02-03 MED ORDER — PROMETHAZINE-DM 6.25-15 MG/5ML PO SYRP: ORAL_SOLUTION | ORAL | 0 refills | Status: DC

## 2022-02-03 MED ORDER — BENZONATATE 100 MG PO CAPS: 100.0000 mg | ORAL_CAPSULE | Freq: Two times a day (BID) | ORAL | 0 refills | Status: DC | PRN

## 2022-02-03 NOTE — Assessment & Plan Note (Signed)
Uncontrolled, needs to commit to daily Singulair and Flonase

## 2022-02-03 NOTE — Assessment & Plan Note (Signed)
Due to uncontrolled allergies, needs to commit to  daily allergy meds, short course of prednisone, phenergan dm at bedtime and tessalon perles are also prescribed

## 2022-02-03 NOTE — Patient Instructions (Addendum)
F/U as before, call if you need me sooner  You are treated for uncontrolled  allergy symptoms causing cough and head and chest congestion  NO antibiotic is indicated or prescribed at this time  Prednisone, tessalon perles, pheneragn dM, singulair  and flonase are all prescribed  Thanks for choosing [facility], we consider it a privelige to serve you.

## 2022-02-03 NOTE — Progress Notes (Signed)
Virtual Visit via Telephone Note  I connected with Kristin Blackwell on 02/03/22 at 11:40 AM EST by telephone and verified that I am speaking with the correct person using two identifiers.  Location: Patient: home Provider: office   I discussed the limitations, risks, security and privacy concerns of performing an evaluation and management service by telephone and the availability of in person appointments. I also discussed with the patient that there may be a patient responsible charge related to this service. The patient expressed understanding and agreed to proceed.   History of Present Illness: 01/27/19-23, a lot of sinus pressure and pain, blowing a lot of her nose and spitting up sputum, no fever, chills, body aches from excess  Less pressure in her face nasal drainage is clear   Observations/Objective: Good communication with no confusionHead congestion and chest congestion with cough  noted   Assessment and Plan: Allergic rhinitis Uncontrolled, needs to commit to daily Singulair and Flonase  Cough Due to uncontrolled allergies, needs to commit to  daily allergy meds, short course of prednisone, phenergan dm at bedtime and tessalon perles are also prescribed   Follow Up Instructions:    I discussed the assessment and treatment plan with the patient. The patient was provided an opportunity to ask questions and all were answered. The patient agreed with the plan and demonstrated an understanding of the instructions.   The patient was advised to call back or seek an in-person evaluation if the symptoms worsen or if the condition fails to improve as anticipated.  I provided 12 minutes of non-face-to-face time during this encounter.   Tula Nakayama, MD

## 2022-02-27 ENCOUNTER — Ambulatory Visit (INDEPENDENT_AMBULATORY_CARE_PROVIDER_SITE_OTHER): Payer: BC Managed Care – PPO | Admitting: Pulmonary Disease

## 2022-02-27 ENCOUNTER — Encounter: Payer: Self-pay | Admitting: Pulmonary Disease

## 2022-02-27 VITALS — BP 132/80 | HR 68 | Ht 65.0 in | Wt 178.0 lb

## 2022-02-27 DIAGNOSIS — G4733 Obstructive sleep apnea (adult) (pediatric): Secondary | ICD-10-CM

## 2022-02-27 NOTE — Progress Notes (Signed)
[facility]  Chief Complaint  Patient presents with   Follow-up    Pt f/u she hasn't been able to use her machine due to having a sinus infection, she has resumed wearing it as of last night but states she feels like it is more "stiffling"    Constitutional:  BP 132/80   Pulse 68   Ht '5\' 5"'$  (1.651 m)   Wt 178 lb (80.7 kg)   SpO2 99%   BMI 29.62 kg/m   Past Medical History:  HLD, Allergies, Asthma, Vit D deficiency  Past Surgical History:  She  has a past surgical history that includes Partial hysterectomy (1987).  Brief Summary:  Kristin Blackwell is a 72 y.o. female former smoker with obstructive sleep apnea.      Subjective:   She had a sinus infection recently and made it difficult for her to use CPAP.  Started back again over the past three days.  Last night she felt like she wasn't getting air.  She had trouble with mask fit over the Summer.  She started using her husband's full face mask and this fit better.  She has a nasal mask she might try later this month.  She doesn't feel like she is having mask leak as much.   Physical Exam:   Appearance - well kempt   ENMT - no sinus tenderness, no oral exudate, no LAN, Mallampati 3 airway, no stridor  Respiratory - equal breath sounds bilaterally, no wheezing or rales  CV - s1s2 regular rate and rhythm, no murmurs  Ext - no clubbing, no edema  Skin - no rashes  Psych - normal mood and affect    Sleep Tests:  HST 01/07/15 >> AHI 25.3, SpO2 low 77% HST 09/03/20 >> AHI 51.6, SpO2 low 72% Auto CPAP 11/29/21 to 02/26/22 >> used on 42 of 90 nights with average 7 hrs 21 min.  Average AI 2.5 with median CPAP 5 and 95 th percentile CPAP 7 cm H2O  Cardiac Tests:  Echo 03/19/21 >> EF 60 to 65%, grade 1 DD  Social History:  She  reports that she quit smoking about 10 years ago. Her smoking use included cigarettes. She has a 30.00 pack-year smoking history. She has never used  smokeless tobacco. She reports current alcohol use. She reports that she does not use drugs.  Family History:  Her family history includes Diabetes in her brother; Emphysema in her father; Hypertension in her mother and sister.     Assessment/Plan:   Obstructive sleep apnea. - she is compliant with CPAP and reports benefit from therapy - she uses [place] for her DME - continue auto CPAP 5 to 15 cm H2O - discussed options to help with mask fit - if she continues to feel like her pressure isn't be delivered adequately, then she might need to have her DME assess the functional status of her machine  CPAP rhinitis. - prn flonase  Time Spent Involved in Patient Care on Day of Examination:  26 minutes  Follow up:   Patient Instructions  Follow up in 1 year  Medication List:   Allergies as of 02/27/2022       Reactions   Ibandronate Sodium    Lipitor [atorvastatin]    Muscle aches   Pravastatin    Muscle aches        Medication List        Accurate as of February 27, 2022  9:19  AM. If you have any questions, ask your nurse or doctor.          aspirin EC 81 MG tablet Take 1 tablet (81 mg total) by mouth daily. Swallow whole.   benzonatate 100 MG capsule Commonly known as: Tessalon Perles Take 1 capsule (100 mg total) by mouth 2 (two) times daily as needed for cough.   fluticasone 50 MCG/ACT nasal spray Commonly known as: FLONASE Place 2 sprays into both nostrils daily.   HAIR/SKIN/NAILS/BIOTIN PO Take 1 capsule by mouth daily.   meclizine 25 MG tablet Commonly known as: ANTIVERT Take 1 tablet (25 mg total) by mouth 3 (three) times daily as needed for dizziness.   metoprolol tartrate 100 MG tablet Commonly known as: LOPRESSOR Take 1 tablet (100 mg total) by mouth as directed. Take 1 tablet 2 hours before your CT scan   montelukast 10 MG tablet Commonly known as: SINGULAIR Take 1 tablet (10 mg total) by mouth at bedtime.    promethazine-dextromethorphan 6.25-15 MG/5ML syrup Commonly known as: PROMETHAZINE-DM Take one teaspoon at bedtime as needed, for excessive cough   rosuvastatin 20 MG tablet Commonly known as: Crestor Take 1 tablet (20 mg total) by mouth daily.   Vitamin D 50 MCG (2000 UT) Caps Take 1 capsule by mouth daily.   zinc gluconate 50 MG tablet Take 50 mg by mouth daily.        Signature:  Chesley Mires, MD [place] Pager - [phone] 02/27/2022, 9:19 AM

## 2022-02-27 NOTE — Patient Instructions (Signed)
Follow up in 1 year.

## 2022-04-29 ENCOUNTER — Ambulatory Visit: Payer: BC Managed Care – PPO | Admitting: Family Medicine

## 2022-04-29 ENCOUNTER — Encounter: Payer: Self-pay | Admitting: Family Medicine

## 2022-04-29 VITALS — BP 132/73 | HR 71 | Ht 65.0 in | Wt 180.1 lb

## 2022-04-29 DIAGNOSIS — E538 Deficiency of other specified B group vitamins: Secondary | ICD-10-CM

## 2022-04-29 DIAGNOSIS — Z87891 Personal history of nicotine dependence: Secondary | ICD-10-CM | POA: Diagnosis not present

## 2022-04-29 DIAGNOSIS — J302 Other seasonal allergic rhinitis: Secondary | ICD-10-CM

## 2022-04-29 DIAGNOSIS — R7303 Prediabetes: Secondary | ICD-10-CM

## 2022-04-29 DIAGNOSIS — D539 Nutritional anemia, unspecified: Secondary | ICD-10-CM

## 2022-04-29 DIAGNOSIS — E559 Vitamin D deficiency, unspecified: Secondary | ICD-10-CM

## 2022-04-29 DIAGNOSIS — Z1231 Encounter for screening mammogram for malignant neoplasm of breast: Secondary | ICD-10-CM | POA: Diagnosis not present

## 2022-04-29 DIAGNOSIS — E785 Hyperlipidemia, unspecified: Secondary | ICD-10-CM | POA: Diagnosis not present

## 2022-04-29 DIAGNOSIS — E663 Overweight: Secondary | ICD-10-CM

## 2022-04-29 DIAGNOSIS — G8929 Other chronic pain: Secondary | ICD-10-CM

## 2022-04-29 DIAGNOSIS — E8881 Metabolic syndrome: Secondary | ICD-10-CM

## 2022-04-29 DIAGNOSIS — M25512 Pain in left shoulder: Secondary | ICD-10-CM

## 2022-04-29 MED ORDER — FLUTICASONE PROPIONATE 50 MCG/ACT NA SUSP
2.0000 | Freq: Every day | NASAL | 6 refills | Status: DC
Start: 2022-04-29 — End: 2022-11-04

## 2022-04-29 MED ORDER — PROMETHAZINE-DM 6.25-15 MG/5ML PO SYRP: ORAL_SOLUTION | ORAL | 0 refills | Status: DC

## 2022-04-29 MED ORDER — MONTELUKAST SODIUM 10 MG PO TABS: 10.0000 mg | ORAL_TABLET | Freq: Every day | ORAL | 6 refills | Status: DC

## 2022-04-29 NOTE — Patient Instructions (Signed)
F/U in 6 months, call if you need me sooner  Please get CBC, fasting lipid, cmp and EGFr, hBA1C, TSH and vit D 3 to 7 days before next appt  Weight loss challenge of 8 pounds  in next 6 months, please change snacks to vegetable and fruit only  Please commit to using allergy medication EVERY day,   Mammogram is past due, need to get this     It is important that you exercise regularly at least 30 minutes 5 times a week. If you develop chest pain, have severe difficulty breathing, or feel very tired, stop exercising immediately and seek medical attention    Think about what you will eat, plan ahead. Choose " clean, green, fresh or frozen" over canned, processed or packaged foods which are more sugary, salty and fatty. 70 to 75% of food eaten should be vegetables and fruit. Three meals at set times with snacks allowed between meals, but they must be fruit or vegetables. Aim to eat over a 12 hour period , example 7 am to 7 pm, and STOP after  your last meal of the day. Drink water,generally about 64 ounces per day, no other drink is as healthy. Fruit juice is best enjoyed in a healthy way, by EATING the fruit.

## 2022-05-03 DIAGNOSIS — E559 Vitamin D deficiency, unspecified: Secondary | ICD-10-CM | POA: Insufficient documentation

## 2022-05-03 NOTE — Assessment & Plan Note (Signed)
The increased risk of cardiovascular disease associated with this diagnosis, and the need to consistently work on lifestyle to change this is discussed. Following  a  heart healthy diet ,commitment to 30 minutes of exercise at least 5 days per week, as well as control of blood sugar and cholesterol , and achieving a healthy weight are all the areas to be addressed .  

## 2022-05-03 NOTE — Assessment & Plan Note (Signed)
Updated lab needed at/ before next visit.   

## 2022-05-03 NOTE — Assessment & Plan Note (Signed)
Hyperlipidemia:Low fat diet discussed and encouraged.   Lipid Panel  Lab Results  Component Value Date   CHOL 165 12/19/2021   HDL 45 12/19/2021   LDLCALC 103 (H) 12/19/2021   TRIG 94 12/19/2021   CHOLHDL 3.7 12/19/2021     Updated lab needed at/ before next visit.

## 2022-05-03 NOTE — Progress Notes (Signed)
Kristin Blackwell     MRN: [id number]      DOB: [birth year]   HPI Kristin Blackwell is here for follow up and re-evaluation of chronic medical conditions, medication management and review of any available recent lab and radiology data.  Preventive health is updated, specifically  Cancer screening and Immunization.   Questions or concerns regarding consultations or procedures which the PT has had in the interim are  addressed. The PT denies any adverse reactions to current medications since the last visit.  Allergies increased and uncontrolled with Season change, no fevr or chills , however has chronic head congestion with ear pressure  ROS Denies recent fever or chills. Denies chest congestion, productive cough or wheezing. Denies chest pains, palpitations and leg swelling Denies abdominal pain, nausea, vomiting,diarrhea or constipation.   Denies dysuria, frequency, hesitancy or incontinence. Denies joint pain, swelling and limitation in mobility. Denies headaches, seizures, numbness, or tingling. Denies depression, anxiety or insomnia. Denies skin break down or rash.   PE  BP 132/73 (BP Location: Left Arm, Patient Position: Sitting, Cuff Size: Large)   Pulse 71   Ht 5\' 5"  (1.651 m)   Wt 180 lb 1.3 oz (81.7 kg)   SpO2 96%   BMI 29.97 kg/m   Patient alert and oriented and in no cardiopulmonary distress.  HEENT: No facial asymmetry, EOMI,     Neck supple .  Chest: Clear to auscultation bilaterally.  CVS: S1, S2 no murmurs, no S3.Regular rate.  ABD: Soft non tender.   Ext: No edema  MS: Adequate ROM spine, shoulders, hips and knees.  Skin: Intact, no ulcerations or rash noted.  Psych: Good eye contact, normal affect. Memory intact not anxious or depressed appearing.  CNS: CN 2-12 intact, power,  normal throughout.no focal deficits noted.   Assessment & Plan  Allergic rhinitis Increased and uncontrolled chronically, needs to commit to DAILY use of medication , may  need immunotherapy however not excessively " bothered " by her symptoms  Prediabetes Patient educated about the importance of limiting  Carbohydrate intake , the need to commit to daily physical activity for a minimum of 30 minutes , and to commit weight loss. The fact that changes in all these areas will reduce or eliminate all together the development of diabetes is stressed.  Updated lab needed at/ before next visit.     Latest Ref Rng & Units 12/19/2021    9:04 AM 08/28/2021    3:44 PM 07/17/2021    8:20 AM 03/26/2021    5:23 PM 03/21/2021    9:48 AM  Diabetic Labs  HbA1c 4.8 - 5.6 % 6.2   6.4     Chol 100 - 199 mg/dL 165   161     HDL >39 mg/dL 45   53     Calc LDL 0 - 99 mg/dL 103   90     Triglycerides 0 - 149 mg/dL 94   101     Creatinine 0.57 - 1.00 mg/dL 0.93  0.76  0.80  0.75  0.94       04/29/2022    2:43 PM 04/29/2022    2:41 PM 02/27/2022    9:09 AM 12/23/2021    3:22 PM 09/16/2021    9:10 AM 09/16/2021    8:34 AM 08/28/2021    2:59 PM  BP/Weight  Systolic BP Q000111Q A999333 Q000111Q [id number]  Diastolic BP 73 82 80 76 76 78 60  Wt. (Lbs)  180.08 178  179.04   176  BMI  29.97 kg/m2 29.62 kg/m2 30.73 kg/m2   29.29 kg/m2       No data to display            H/O tobacco use, presenting hazards to health Needs screening for lung cancer will need to contact her about this  Hyperlipidemia LDL goal <100 Hyperlipidemia:Low fat diet discussed and encouraged.   Lipid Panel  Lab Results  Component Value Date   CHOL 165 12/19/2021   HDL 45 12/19/2021   LDLCALC 103 (H) 12/19/2021   TRIG 94 12/19/2021   CHOLHDL 3.7 12/19/2021     Updated lab needed at/ before next visit.   Metabolic syndrome X The increased risk of cardiovascular disease associated with this diagnosis, and the need to consistently work on lifestyle to change this is discussed. Following  a  heart healthy diet ,commitment to 30 minutes of exercise at least 5 days per week, as well as control of blood sugar  and cholesterol , and achieving a healthy weight are all the areas to be addressed .   Overweight (BMI 25.0-29.9)  Patient re-educated about  the importance of commitment to a  minimum of 150 minutes of exercise per week as able.  The importance of healthy food choices with portion control discussed, as well as eating regularly and within a 12 hour window most days. The need to choose "clean , green" food 50 to 75% of the time is discussed, as well as to make water the primary drink and set a goal of 64 ounces water daily.       04/29/2022    2:41 PM 02/27/2022    9:09 AM 12/23/2021    3:22 PM  Weight /BMI  Weight 180 lb 1.3 oz 178 lb 179 lb 0.6 oz  Height 5\' 5"  (1.651 m) 5\' 5"  (1.651 m) 5\' 4"  (1.626 m)  BMI 29.97 kg/m2 29.62 kg/m2 30.73 kg/m2      Chronic left shoulder pain improved  Vitamin D deficiency Updated lab needed at/ before next visit.

## 2022-05-03 NOTE — Assessment & Plan Note (Signed)
Patient educated about the importance of limiting  Carbohydrate intake , the need to commit to daily physical activity for a minimum of 30 minutes , and to commit weight loss. The fact that changes in all these areas will reduce or eliminate all together the development of diabetes is stressed.  Updated lab needed at/ before next visit.     Latest Ref Rng & Units 12/19/2021    9:04 AM 08/28/2021    3:44 PM 07/17/2021    8:20 AM 03/26/2021    5:23 PM 03/21/2021    9:48 AM  Diabetic Labs  HbA1c 4.8 - 5.6 % 6.2   6.4     Chol 100 - 199 mg/dL 165   161     HDL >39 mg/dL 45   53     Calc LDL 0 - 99 mg/dL 103   90     Triglycerides 0 - 149 mg/dL 94   101     Creatinine 0.57 - 1.00 mg/dL 0.93  0.76  0.80  0.75  0.94       04/29/2022    2:43 PM 04/29/2022    2:41 PM 02/27/2022    9:09 AM 12/23/2021    3:22 PM 09/16/2021    9:10 AM 09/16/2021    8:34 AM 08/28/2021    2:59 PM  BP/Weight  Systolic BP Q000111Q A999333 Q000111Q [id number]  Diastolic BP 73 82 80 76 76 78 60  Wt. (Lbs)  180.08 178 179.04   176  BMI  29.97 kg/m2 29.62 kg/m2 30.73 kg/m2   29.29 kg/m2       No data to display

## 2022-05-03 NOTE — Assessment & Plan Note (Signed)
Needs screening for lung cancer will need to contact her about this

## 2022-05-03 NOTE — Assessment & Plan Note (Signed)
improved

## 2022-05-03 NOTE — Assessment & Plan Note (Signed)
  Patient re-educated about  the importance of commitment to a  minimum of 150 minutes of exercise per week as able.  The importance of healthy food choices with portion control discussed, as well as eating regularly and within a 12 hour window most days. The need to choose "clean , green" food 50 to 75% of the time is discussed, as well as to make water the primary drink and set a goal of 64 ounces water daily.       04/29/2022    2:41 PM 02/27/2022    9:09 AM 12/23/2021    3:22 PM  Weight /BMI  Weight 180 lb 1.3 oz 178 lb 179 lb 0.6 oz  Height 5\' 5"  (1.651 m) 5\' 5"  (1.651 m) 5\' 4"  (1.626 m)  BMI 29.97 kg/m2 29.62 kg/m2 30.73 kg/m2

## 2022-05-03 NOTE — Assessment & Plan Note (Signed)
Increased and uncontrolled chronically, needs to commit to DAILY use of medication , may need immunotherapy however not excessively " bothered " by her symptoms

## 2022-05-21 LAB — HM MAMMOGRAPHY

## 2022-11-04 ENCOUNTER — Ambulatory Visit: Payer: BC Managed Care – PPO | Admitting: Family Medicine

## 2022-11-04 ENCOUNTER — Encounter: Payer: Self-pay | Admitting: Family Medicine

## 2022-11-04 VITALS — BP 132/69 | HR 63 | Ht 65.0 in | Wt 181.0 lb

## 2022-11-04 DIAGNOSIS — E785 Hyperlipidemia, unspecified: Secondary | ICD-10-CM

## 2022-11-04 DIAGNOSIS — E663 Overweight: Secondary | ICD-10-CM | POA: Diagnosis not present

## 2022-11-04 DIAGNOSIS — E559 Vitamin D deficiency, unspecified: Secondary | ICD-10-CM

## 2022-11-04 DIAGNOSIS — E538 Deficiency of other specified B group vitamins: Secondary | ICD-10-CM

## 2022-11-04 DIAGNOSIS — Z23 Encounter for immunization: Secondary | ICD-10-CM

## 2022-11-04 DIAGNOSIS — J302 Other seasonal allergic rhinitis: Secondary | ICD-10-CM

## 2022-11-04 DIAGNOSIS — E8881 Metabolic syndrome: Secondary | ICD-10-CM

## 2022-11-04 DIAGNOSIS — R7303 Prediabetes: Secondary | ICD-10-CM

## 2022-11-04 MED ORDER — FLUTICASONE PROPIONATE 50 MCG/ACT NA SUSP: 2.0000 | Freq: Every day | NASAL | 6 refills | Status: DC

## 2022-11-04 MED ORDER — MONTELUKAST SODIUM 10 MG PO TABS
10.0000 mg | ORAL_TABLET | Freq: Every day | ORAL | 6 refills | Status: DC
Start: 2022-11-04 — End: 2024-01-10

## 2022-11-04 MED ORDER — LORATADINE 10 MG PO TABS: ORAL_TABLET | ORAL | 3 refills | Status: AC

## 2022-11-04 MED ORDER — PREDNISONE 5 MG PO TABS: 5.0000 mg | ORAL_TABLET | Freq: Two times a day (BID) | ORAL | 0 refills | Status: AC

## 2022-11-04 NOTE — Patient Instructions (Addendum)
F/U in 5 months, call if you need me  before  Flu vaccine today  Fasting lipid, cmp and EGFr, TSH and vit d  and B12 level, cbc, and HBA1c next week ( in ofice)  Nurse pls send for mammogram  from [facility] and document  Quit smoking x 10 years , smoked 1/2 PPD  to 1 PPD  from age 72 , nurse pls refer for screening if needed   Start  daily loratadine once to twice daily as needed then once daily, continue daily montelukast and fluticasone, due to uncontrolled allergies  Please get covid vaccine from your pharmacy/ at your workplace  Watch the habit you need to control  It is important that you exercise regularly at least 30 minutes 5 times a week. If you develop chest pain, have severe difficulty breathing, or feel very tired, stop exercising immediately and seek medical attention

## 2022-11-08 ENCOUNTER — Encounter: Payer: Self-pay | Admitting: Family Medicine

## 2022-11-08 NOTE — Progress Notes (Signed)
Kristin Blackwell     MRN: [id number]      DOB: 08/28/1950  Chief Complaint  Patient presents with   Follow-up    Follow up feels lightheaded off balance    HPI Ms. Kristin Blackwell is here for follow up and re-evaluation of chronic medical conditions, medication management and review of any available recent lab and radiology data.  Preventive health is updated, specifically  Cancer screening and Immunization.   C/o intermittent light headedness with change in position ,not taking allergy meds consistently and has nasal and sinus pressure with increased post nasal drainage in past month, no fever or chills, drainage is clear, non compliant with allergy meds  ROS  Denies chest congestion, productive cough or wheezing. Denies chest pains, palpitations and leg swelling Denies abdominal pain, nausea, vomiting,diarrhea or constipation.   Denies dysuria, frequency, hesitancy or incontinence. Denies joint pain, swelling and limitation in mobility. Denies headaches, seizures, numbness, or tingling. Denies depression, anxiety or insomnia. Denies skin break down or rash.   PE  BP 132/69 (BP Location: Right Arm, Patient Position: Sitting, Cuff Size: Large)   Pulse 63   Ht 5\' 5"  (1.651 m)   Wt 181 lb (82.1 kg)   SpO2 97%   BMI 30.12 kg/m   Patient alert and oriented and in no cardiopulmonary distress.  HEENT: No facial asymmetry, EOMI,     Neck supple .Nasal congestion, no sinus pressure  Chest: Clear to auscultation bilaterally.  CVS: S1, S2 no murmurs, no S3.Regular rate.  ABD: Soft non tender.   Ext: No edema  MS: Adequate ROM spine, shoulders, hips and knees.  Skin: Intact, no ulcerations or rash noted.  Psych: Good eye contact, normal affect. Memory intact not anxious or depressed appearing.  CNS: CN 2-12 intact, power,  normal throughout.no focal deficits noted.   Assessment & Plan  Allergic rhinitis Uncontrolled , short course of prednisone, prescribed, needs to double  claritin and commit to daily singulair  Overweight (BMI 25.0-29.9)  Patient re-educated about  the importance of commitment to a  minimum of 150 minutes of exercise per week as able.  The importance of healthy food choices with portion control discussed, as well as eating regularly and within a 12 hour window most days. The need to choose "clean , green" food 50 to 75% of the time is discussed, as well as to make water the primary drink and set a goal of 64 ounces water daily.       11/04/2022    2:51 PM 04/29/2022    2:41 PM 02/27/2022    9:09 AM  Weight /BMI  Weight 181 lb 180 lb 1.3 oz 178 lb  Height 5\' 5"  (1.651 m) 5\' 5"  (1.651 m) 5\' 5"  (1.651 m)  BMI 30.12 kg/m2 29.97 kg/m2 29.62 kg/m2    Unchnaged, needs towork on food choice and start exercise commitment  Prediabetes Patient educated about the importance of limiting  Carbohydrate intake , the need to commit to daily physical activity for a minimum of 30 minutes , and to commit weight loss. The fact that changes in all these areas will reduce or eliminate all together the development of diabetes is stressed.      Latest Ref Rng & Units 12/19/2021    9:04 AM 08/28/2021    3:44 PM 07/17/2021    8:20 AM 03/26/2021    5:23 PM 03/21/2021    9:48 AM  Diabetic Labs  HbA1c 4.8 - 5.6 % 6.2   6.4  Chol 100 - 199 mg/dL 956   387     HDL >56 mg/dL 45   53     Calc LDL 0 - 99 mg/dL 433   90     Triglycerides 0 - 149 mg/dL 94   295     Creatinine 0.57 - 1.00 mg/dL 1.88  4.16  6.06  3.01  0.94       11/04/2022    2:51 PM 04/29/2022    2:43 PM 04/29/2022    2:41 PM 02/27/2022    9:09 AM 12/23/2021    3:22 PM 09/16/2021    9:10 AM 09/16/2021    8:34 AM  BP/Weight  Systolic BP 132 132 143 132 116 126 130  Diastolic BP 69 73 82 80 76 76 78  Wt. (Lbs) 181  180.08 178 179.04    BMI 30.12 kg/m2  29.97 kg/m2 29.62 kg/m2 30.73 kg/m2         No data to display          Updated lab needed at/ before next visit.   Metabolic syndrome  X The increased risk of cardiovascular disease associated with this diagnosis, and the need to consistently work on lifestyle to change this is discussed. Following  a  heart healthy diet ,commitment to 30 minutes of exercise at least 5 days per week, as well as control of blood sugar and cholesterol , and achieving a healthy weight are all the areas to be addressed .

## 2022-11-08 NOTE — Assessment & Plan Note (Signed)
  Patient re-educated about  the importance of commitment to a  minimum of 150 minutes of exercise per week as able.  The importance of healthy food choices with portion control discussed, as well as eating regularly and within a 12 hour window most days. The need to choose "clean , green" food 50 to 75% of the time is discussed, as well as to make water the primary drink and set a goal of 64 ounces water daily.       11/04/2022    2:51 PM 04/29/2022    2:41 PM 02/27/2022    9:09 AM  Weight /BMI  Weight 181 lb 180 lb 1.3 oz 178 lb  Height 5\' 5"  (1.651 m) 5\' 5"  (1.651 m) 5\' 5"  (1.651 m)  BMI 30.12 kg/m2 29.97 kg/m2 29.62 kg/m2    Unchnaged, needs towork on food choice and start exercise commitment

## 2022-11-08 NOTE — Assessment & Plan Note (Signed)
The increased risk of cardiovascular disease associated with this diagnosis, and the need to consistently work on lifestyle to change this is discussed. Following  a  heart healthy diet ,commitment to 30 minutes of exercise at least 5 days per week, as well as control of blood sugar and cholesterol , and achieving a healthy weight are all the areas to be addressed .  

## 2022-11-08 NOTE — Assessment & Plan Note (Signed)
Patient educated about the importance of limiting  Carbohydrate intake , the need to commit to daily physical activity for a minimum of 30 minutes , and to commit weight loss. The fact that changes in all these areas will reduce or eliminate all together the development of diabetes is stressed.      Latest Ref Rng & Units 12/19/2021    9:04 AM 08/28/2021    3:44 PM 07/17/2021    8:20 AM 03/26/2021    5:23 PM 03/21/2021    9:48 AM  Diabetic Labs  HbA1c 4.8 - 5.6 % 6.2   6.4     Chol 100 - 199 mg/dL 098   119     HDL >14 mg/dL 45   53     Calc LDL 0 - 99 mg/dL 782   90     Triglycerides 0 - 149 mg/dL 94   956     Creatinine 0.57 - 1.00 mg/dL 2.13  0.86  5.78  4.69  0.94       11/04/2022    2:51 PM 04/29/2022    2:43 PM 04/29/2022    2:41 PM 02/27/2022    9:09 AM 12/23/2021    3:22 PM 09/16/2021    9:10 AM 09/16/2021    8:34 AM  BP/Weight  Systolic BP 132 132 143 132 116 126 130  Diastolic BP 69 73 82 80 76 76 78  Wt. (Lbs) 181  180.08 178 179.04    BMI 30.12 kg/m2  29.97 kg/m2 29.62 kg/m2 30.73 kg/m2         No data to display          Updated lab needed at/ before next visit.

## 2022-11-08 NOTE — Assessment & Plan Note (Signed)
Uncontrolled , short course of prednisone, prescribed, needs to double claritin and commit to daily singulair

## 2022-11-21 LAB — CMP14+EGFR
ALT: 15 [IU]/L (ref 0–32)
AST: 22 [IU]/L (ref 0–40)
Albumin: 4 g/dL (ref 3.8–4.8)
Alkaline Phosphatase: 72 [IU]/L (ref 44–121)
BUN/Creatinine Ratio: 13 (ref 12–28)
BUN: 12 mg/dL (ref 8–27)
Bilirubin Total: 0.3 mg/dL (ref 0.0–1.2)
CO2: 22 mmol/L (ref 20–29)
Calcium: 9.1 mg/dL (ref 8.7–10.3)
Chloride: 104 mmol/L (ref 96–106)
Creatinine, Ser: 0.92 mg/dL (ref 0.57–1.00)
Globulin, Total: 2.9 g/dL (ref 1.5–4.5)
Glucose: 93 mg/dL (ref 70–99)
Potassium: 4.2 mmol/L (ref 3.5–5.2)
Sodium: 141 mmol/L (ref 134–144)
Total Protein: 6.9 g/dL (ref 6.0–8.5)
eGFR: 67 mL/min/{1.73_m2} (ref >59)

## 2022-11-21 LAB — VITAMIN D 25 HYDROXY (VIT D DEFICIENCY, FRACTURES): Vit D, 25-Hydroxy: 44.9 ng/mL (ref 30.0–100.0)

## 2022-11-21 LAB — LIPID PANEL
Chol/HDL Ratio: 3.1 {ratio} (ref 0.0–4.4)
Cholesterol, Total: 139 mg/dL (ref 100–199)
HDL: 45 mg/dL (ref >39)
LDL Chol Calc (NIH): 75 mg/dL (ref 0–99)
Triglycerides: 100 mg/dL (ref 0–149)
VLDL Cholesterol Cal: 19 mg/dL (ref 5–40)

## 2022-11-21 LAB — HEMOGLOBIN A1C
Est. average glucose Bld gHb Est-mCnc: 140 mg/dL
Hgb A1c MFr Bld: 6.5 % — ABNORMAL HIGH (ref 4.8–5.6)

## 2022-11-21 LAB — VITAMIN B12: Vitamin B-12: 400 pg/mL (ref 232–1245)

## 2022-11-21 LAB — TSH: TSH: 1.54 u[IU]/mL (ref 0.450–4.500)

## 2022-11-25 ENCOUNTER — Other Ambulatory Visit: Payer: Self-pay

## 2022-11-25 DIAGNOSIS — R7303 Prediabetes: Secondary | ICD-10-CM

## 2022-12-30 ENCOUNTER — Ambulatory Visit (INDEPENDENT_AMBULATORY_CARE_PROVIDER_SITE_OTHER): Payer: BC Managed Care – PPO

## 2022-12-30 ENCOUNTER — Ambulatory Visit: Payer: BC Managed Care – PPO | Admitting: Podiatry

## 2022-12-30 ENCOUNTER — Encounter: Payer: Self-pay | Admitting: Podiatry

## 2022-12-30 DIAGNOSIS — M779 Enthesopathy, unspecified: Secondary | ICD-10-CM

## 2022-12-30 DIAGNOSIS — M778 Other enthesopathies, not elsewhere classified: Secondary | ICD-10-CM | POA: Diagnosis not present

## 2022-12-30 MED ORDER — TRIAMCINOLONE ACETONIDE 10 MG/ML IJ SUSP
10.0000 mg | Freq: Once | INTRAMUSCULAR | Status: AC
  Administered 2022-12-30: 10 mg via INTRA_ARTICULAR

## 2023-02-26 ENCOUNTER — Other Ambulatory Visit: Payer: Self-pay | Admitting: Family Medicine

## 2023-03-10 ENCOUNTER — Ambulatory Visit: Payer: 59 | Admitting: Primary Care

## 2023-03-10 ENCOUNTER — Encounter: Payer: Self-pay | Admitting: Primary Care

## 2023-03-10 VITALS — BP 123/75 | HR 68 | Ht 65.0 in | Wt 184.0 lb

## 2023-03-10 DIAGNOSIS — G4733 Obstructive sleep apnea (adult) (pediatric): Secondary | ICD-10-CM

## 2023-03-10 NOTE — Progress Notes (Signed)
@Patient  ID: Kristin Blackwell, female    DOB: December 22, 1950, 73 y.o.   MRN: [id number]  Chief Complaint  Patient presents with   Follow-up    Referring provider: Kerri Perches, MD  HPI: 73 year old female, former smoker. PMH significant OSA, allergic rhinitis, GERD, LBBB, TIA, B12 deficiency, hyperlipidemia, metabolic syndrome, prediabetes, vit D deficiency.   03/10/2023 Discussed the use of AI scribe software for clinical note transcription with the patient, who gave verbal consent to proceed.  History of Present Illness   The patient, followed for sleep apnea and nasal rhinitis, reports inconsistent use of her CPAP machine due to discomfort and inconvenience. She has been alternating between two full face masks, which cover both the mouth and nose, but has experienced issues with air leaks, possibly due to an ill-fitting mask or worn-out cushion. The patient acknowledges that she sleeps more restfully when using the CPAP machine, but finds it a hassle to use regularly. She has a nasal mask at home, previously belonging to her late husband, which she has not yet tried. The patient's most recent sleep study in 2022 showed severe sleep apnea, with an average of 51 apneic events per hour and oxygen levels dropping as low as 72%. The patient is aware of the severity of her condition and the potential long-term risks associated with untreated sleep apnea.     [employer] compliance report 02/07/2023 - 03/08/2023 Usage days 5/30 days (17%) greater than 4 hours Average usage days used 7 hours 33 minutes Pressure 5-15cm h20 (5.5 cm H2O-95%) Air leaks 119 L/min (95%) AHI 5   Allergies  Allergen Reactions   Ibandronate Sodium    Lipitor [Atorvastatin]     Muscle aches   Pravastatin     Muscle aches    Immunization History  Administered Date(s) Administered   Fluad Quad(high Dose 65+) 11/08/2018, 11/26/2020, 12/23/2021   Fluad Trivalent(High Dose 65+) 11/04/2022   Influenza Whole  12/04/2014   Influenza,inj,Quad PF,6+ Mos 01/24/2013   Influenza-Unspecified 01/03/2020   Moderna Sars-Covid-2 Vaccination 04/27/2019, 05/30/2019, 12/20/2019   Pneumococcal Conjugate-13 06/29/2016   Pneumococcal Polysaccharide-23 08/16/2017   Tdap 09/08/2010, 11/26/2020   Zoster Recombinant(Shingrix) 02/07/2020, 04/26/2020   Zoster, Live 09/21/2012    Past Medical History:  Diagnosis Date   Hyperlipidemia    Nicotine addiction    Overweight(278.02)     Tobacco History: Social History   Tobacco Use  Smoking Status Former   Current packs/day: 0.00   Average packs/day: 1 pack/day for 30.0 years (30.0 ttl pk-yrs)   Types: Cigarettes   Start date: 57   Quit date: 2014   Years since quitting: 11.0  Smokeless Tobacco Never   Counseling given: Not Answered   Outpatient Medications Prior to Visit  Medication Sig Dispense Refill   aspirin EC 81 MG EC tablet Take 1 tablet (81 mg total) by mouth daily. Swallow whole. 30 tablet 11   Cholecalciferol (VITAMIN D) 50 MCG (2000 UT) CAPS Take 1 capsule by mouth daily.     fluticasone (FLONASE) 50 MCG/ACT nasal spray Place 2 sprays into both nostrils daily. 16 g 6   loratadine (CLARITIN) 10 MG tablet Take one tablet by mouth twice daily for 1 week, then once daily 45 tablet 3   montelukast (SINGULAIR) 10 MG tablet Take 1 tablet (10 mg total) by mouth at bedtime. 30 tablet 6   Multiple Vitamins-Minerals (HAIR/SKIN/NAILS/BIOTIN PO) Take 1 capsule by mouth daily.     promethazine-dextromethorphan (PROMETHAZINE-DM) 6.25-15 MG/5ML syrup Take one teaspoon  at bedtime as needed, for excessive cough 118 mL 0   rosuvastatin (CRESTOR) 20 MG tablet TAKE 1 TABLET BY MOUTH EVERY DAY 90 tablet 3   zinc gluconate 50 MG tablet Take 50 mg by mouth daily.     No facility-administered medications prior to visit.   Review of Systems  Review of Systems  Constitutional: Negative.   HENT: Negative.    Respiratory: Negative.    Psychiatric/Behavioral:   Positive for sleep disturbance.    Physical Exam  BP 123/75   Pulse 68   Ht 5\' 5"  (1.651 m)   Wt 184 lb (83.5 kg)   SpO2 97% Comment: room air  BMI 30.62 kg/m  Physical Exam Constitutional:      Appearance: Normal appearance.  HENT:     Head: Normocephalic and atraumatic.  Cardiovascular:     Rate and Rhythm: Normal rate and regular rhythm.  Pulmonary:     Effort: Pulmonary effort is normal.     Breath sounds: Normal breath sounds.  Neurological:     General: No focal deficit present.     Mental Status: She is alert and oriented to person, place, and time. Mental status is at baseline.  Psychiatric:        Mood and Affect: Mood normal.        Behavior: Behavior normal.        Thought Content: Thought content normal.        Judgment: Judgment normal.      Lab Results:  CBC    Component Value Date/Time   WBC 7.6 03/26/2021 1723   RBC 4.54 03/26/2021 1723   HGB 12.5 03/26/2021 1723   HGB 12.7 11/22/2020 0938   HCT 39.7 03/26/2021 1723   HCT 38.7 11/22/2020 0938   PLT 310 03/26/2021 1723   PLT 295 11/22/2020 0938   MCV 87.4 03/26/2021 1723   MCV 83 11/22/2020 0938   MCH 27.5 03/26/2021 1723   MCHC 31.5 03/26/2021 1723   RDW 15.3 03/26/2021 1723   RDW 14.5 11/22/2020 0938   LYMPHSABS 3.8 03/26/2021 1723   MONOABS 0.4 03/26/2021 1723   EOSABS 0.1 03/26/2021 1723   BASOSABS 0.1 03/26/2021 1723    BMET    Component Value Date/Time   NA 141 11/20/2022 0940   K 4.2 11/20/2022 0940   CL 104 11/20/2022 0940   CO2 22 11/20/2022 0940   GLUCOSE 93 11/20/2022 0940   GLUCOSE 86 03/26/2021 1723   BUN 12 11/20/2022 0940   CREATININE 0.92 11/20/2022 0940   CREATININE 0.82 03/30/2019 1006   CALCIUM 9.1 11/20/2022 0940   GFRNONAA >60 03/26/2021 1723   GFRNONAA 74 03/30/2019 1006   GFRAA 80 10/27/2019 0946   GFRAA 85 03/30/2019 1006    BNP No results found for: "BNP"  ProBNP No results found for: "PROBNP"  Imaging: No results found.   Assessment & Plan:    1. OSA (obstructive sleep apnea) (Primary) - Ambulatory Referral for DME     Severe Sleep Apnea Inconsistent CPAP use due to discomfort and inconvenience. Noted air leaks and potential mask size issue. Discussed the risks of untreated severe sleep apnea including cardiac arrhythmias, stroke, and pulmonary hypertension. -Advise patient to try nasal mask that she has on hand and attempt to use CPAP nightly. -Order a mask fitting at [facility] for the Airfit N20 nasal mask. -Check insurance coverage for CPAP supplies and consider purchasing from [employer] or [employer] if more affordable. -Follow-up in 3 months to assess CPAP  usage and effectiveness.      Glenford Bayley, NP 03/10/2023

## 2023-03-10 NOTE — Patient Instructions (Signed)
-  SEVERE SLEEP APNEA: Severe sleep apnea is a condition where you experience frequent interruptions in breathing during sleep, leading to low oxygen levels and poor sleep quality. Untreated, it can cause serious health problems like heart issues and stroke. To improve your CPAP usage, try using the nasal mask you have at home and schedule a mask fitting for the Airfit N20 nasal mask. Check your insurance for CPAP supplies coverage and consider purchasing from [employer] or [employer] if it's more affordable. We will follow up in 3 months to see how you are doing.  INSTRUCTIONS:  Please attempt to use your CPAP machine every night and try the nasal mask you have at home. Schedule a mask fitting at [facility] for the Airfit N20 nasal mask. Check your insurance coverage for CPAP supplies and consider purchasing from [facility] or [employer] if more affordable. We will follow up in 3 months to assess your CPAP usage and effectiveness.  Follow-up 2-3 months with Beth NP or Tammy in [place] for CPAP compliance

## 2023-04-06 LAB — BMP8+EGFR
BUN/Creatinine Ratio: 18 (ref 12–28)
BUN: 15 mg/dL (ref 8–27)
CO2: 21 mmol/L (ref 20–29)
Calcium: 9 mg/dL (ref 8.7–10.3)
Chloride: 108 mmol/L — ABNORMAL HIGH (ref 96–106)
Creatinine, Ser: 0.84 mg/dL (ref 0.57–1.00)
Glucose: 105 mg/dL — ABNORMAL HIGH (ref 70–99)
Potassium: 4.3 mmol/L (ref 3.5–5.2)
Sodium: 143 mmol/L (ref 134–144)
eGFR: 74 mL/min/{1.73_m2} (ref >59)

## 2023-04-06 LAB — HEMOGLOBIN A1C
Est. average glucose Bld gHb Est-mCnc: 143 mg/dL
Hgb A1c MFr Bld: 6.6 % — ABNORMAL HIGH (ref 4.8–5.6)

## 2023-04-09 ENCOUNTER — Ambulatory Visit: Payer: 59 | Admitting: Family Medicine

## 2023-04-09 ENCOUNTER — Encounter: Payer: Self-pay | Admitting: Family Medicine

## 2023-04-09 VITALS — BP 124/78 | HR 65 | Ht 65.0 in | Wt 184.1 lb

## 2023-04-09 DIAGNOSIS — E559 Vitamin D deficiency, unspecified: Secondary | ICD-10-CM | POA: Diagnosis not present

## 2023-04-09 DIAGNOSIS — E785 Hyperlipidemia, unspecified: Secondary | ICD-10-CM | POA: Diagnosis not present

## 2023-04-09 DIAGNOSIS — R7303 Prediabetes: Secondary | ICD-10-CM

## 2023-04-09 DIAGNOSIS — E663 Overweight: Secondary | ICD-10-CM

## 2023-04-09 DIAGNOSIS — E66811 Obesity, class 1: Secondary | ICD-10-CM | POA: Diagnosis not present

## 2023-04-09 DIAGNOSIS — Z87891 Personal history of nicotine dependence: Secondary | ICD-10-CM

## 2023-04-09 NOTE — Patient Instructions (Addendum)
 F/U in 18 weeks , call if you need me sooner  Blood sugar has increased, pls get nutrition education re diabetes,check with [employer] and also let me know if you need a referral  It is important that you exercise regularly at least 30 minutes 5 times a week. If you develop chest pain, have severe difficulty breathing, or feel very tired, stop exercising immediately and seek medical attention   Fasting lipid, cmp and eGr, hBA1c, CBC, 3 to 7 days before next appt'    Thanks for choosing [facility], we consider it a privelige to serve you.

## 2023-04-12 ENCOUNTER — Encounter: Payer: Self-pay | Admitting: Family Medicine

## 2023-04-12 NOTE — Progress Notes (Signed)
 Kristin Blackwell     MRN: [id number]      DOB: 1950-10-04  Chief Complaint  Patient presents with   Follow-up    Follow up    HPI Kristin Blackwell is here for follow up and re-evaluation of chronic medical conditions, medication management and review of any available recent lab and radiology data.  Preventive health is updated, specifically  Cancer screening and Immunization.   Questions or concerns regarding consultations or procedures which the PT has had in the interim are  addressed. The PT denies any adverse reactions to current medications since the last visit.  There are no new concerns.  There are no specific complaints   ROS Denies recent fever or chills. Denies sinus pressure, nasal congestion, ear pain or sore throat. Denies chest congestion, productive cough or wheezing. Denies chest pains, palpitations and leg swelling Denies abdominal pain, nausea, vomiting,diarrhea or constipation.   Denies dysuria, frequency, hesitancy or incontinence. Denies joint pain, swelling and limitation in mobility. Denies headaches, seizures, numbness, or tingling. Denies depression, anxiety or insomnia. Denies skin break down or rash.   PE  BP 124/78   Pulse 65   Ht 5\' 5"  (1.651 m)   Wt 184 lb 1.9 oz (83.5 kg)   SpO2 96%   BMI 30.64 kg/m   Patient alert and oriented and in no cardiopulmonary distress.  HEENT: No facial asymmetry, EOMI,     Neck supple .  Chest: Clear to auscultation bilaterally.  CVS: S1, S2 no murmurs, no S3.Regular rate.  ABD: Soft non tender.   Ext: No edema  MS: Adequate ROM spine, shoulders, hips and knees.  Skin: Intact, no ulcerations or rash noted.  Psych: Good eye contact, normal affect. Memory intact not anxious or depressed appearing.  CNS: CN 2-12 intact, power,  normal throughout.no focal deficits noted.   Assessment & Plan  Prediabetes Patient educated about the importance of limiting  Carbohydrate intake , the need to commit to  daily physical activity for a minimum of 30 minutes , and to commit weight loss. The fact that changes in all these areas will reduce or eliminate all together the development of diabetes is stressed.      Latest Ref Rng & Units 04/05/2023    8:02 AM 11/20/2022    9:40 AM 12/19/2021    9:04 AM 08/28/2021    3:44 PM 07/17/2021    8:20 AM  Diabetic Labs  HbA1c 4.8 - 5.6 % 6.6  6.5  6.2   6.4   Chol 100 - 199 mg/dL  782  956   213   HDL >08 mg/dL  45  45   53   Calc LDL 0 - 99 mg/dL  75  657   90   Triglycerides 0 - 149 mg/dL  846  94   962   Creatinine 0.57 - 1.00 mg/dL 9.52  8.41  3.24  4.01  0.80       04/09/2023   10:51 AM 04/09/2023   10:12 AM 03/10/2023    3:41 PM 11/04/2022    2:51 PM 04/29/2022    2:43 PM 04/29/2022    2:41 PM 02/27/2022    9:09 AM  BP/Weight  Systolic BP 124 134 123 132 132 143 132  Diastolic BP 78 78 75 69 73 82 80  Wt. (Lbs)  184.12 184 181  180.08 178  BMI  30.64 kg/m2 30.62 kg/m2 30.12 kg/m2  29.97 kg/m2 29.62 kg/m2  No data to display          Deteriorated , if at next check HBA1C is 6.5 or more she will be labelled diabetic, dooes not want to commit to medication at this time, staytes she will focus more on lifestyle chane and carry it out Counselled for 10 mins re changes needed and recommend diabetic ed  Obesity (BMI 30.0-34.9)  Patient re-educated about  the importance of commitment to a  minimum of 150 minutes of exercise per week as able.  The importance of healthy food choices with portion control discussed, as well as eating regularly and within a 12 hour window most days. The need to choose "clean , green" food 50 to 75% of the time is discussed, as well as to make water the primary drink and set a goal of 64 ounces water daily.       04/09/2023   10:12 AM 03/10/2023    3:41 PM 11/04/2022    2:51 PM  Weight /BMI  Weight 184 lb 1.9 oz 184 lb 181 lb  Height 5\' 5"  (1.651 m) 5\' 5"  (1.651 m) 5\' 5"  (1.651 m)  BMI 30.64 kg/m2 30.62 kg/m2  30.12 kg/m2    unchnaged  H/O tobacco use, presenting hazards to health Refer for lung cancer screening  Hyperlipidemia LDL goal <100 Hyperlipidemia:Low fat diet discussed and encouraged.   Lipid Panel  Lab Results  Component Value Date   CHOL 139 11/20/2022   HDL 45 11/20/2022   LDLCALC 75 11/20/2022   TRIG 100 11/20/2022   CHOLHDL 3.1 11/20/2022     Updated lab needed at/ before next visit.   Vitamin D deficiency Updated lab needed at/ before next visit.

## 2023-04-12 NOTE — Assessment & Plan Note (Signed)
 Patient educated about the importance of limiting  Carbohydrate intake , the need to commit to daily physical activity for a minimum of 30 minutes , and to commit weight loss. The fact that changes in all these areas will reduce or eliminate all together the development of diabetes is stressed.      Latest Ref Rng & Units 04/05/2023    8:02 AM 11/20/2022    9:40 AM 12/19/2021    9:04 AM 08/28/2021    3:44 PM 07/17/2021    8:20 AM  Diabetic Labs  HbA1c 4.8 - 5.6 % 6.6  6.5  6.2   6.4   Chol 100 - 199 mg/dL  161  096   045   HDL >40 mg/dL  45  45   53   Calc LDL 0 - 99 mg/dL  75  981   90   Triglycerides 0 - 149 mg/dL  191  94   478   Creatinine 0.57 - 1.00 mg/dL 2.95  6.21  3.08  6.57  0.80       04/09/2023   10:51 AM 04/09/2023   10:12 AM 03/10/2023    3:41 PM 11/04/2022    2:51 PM 04/29/2022    2:43 PM 04/29/2022    2:41 PM 02/27/2022    9:09 AM  BP/Weight  Systolic BP 124 134 123 132 132 143 132  Diastolic BP 78 78 75 69 73 82 80  Wt. (Lbs)  184.12 184 181  180.08 178  BMI  30.64 kg/m2 30.62 kg/m2 30.12 kg/m2  29.97 kg/m2 29.62 kg/m2       No data to display          Deteriorated , if at next check HBA1C is 6.5 or more she will be labelled diabetic, dooes not want to commit to medication at this time, staytes she will focus more on lifestyle chane and carry it out Counselled for 10 mins re changes needed and recommend diabetic ed

## 2023-04-12 NOTE — Assessment & Plan Note (Signed)
 Updated lab needed at/ before next visit.

## 2023-04-12 NOTE — Assessment & Plan Note (Signed)
 Hyperlipidemia:Low fat diet discussed and encouraged.   Lipid Panel  Lab Results  Component Value Date   CHOL 139 11/20/2022   HDL 45 11/20/2022   LDLCALC 75 11/20/2022   TRIG 100 11/20/2022   CHOLHDL 3.1 11/20/2022     Updated lab needed at/ before next visit.

## 2023-04-12 NOTE — Assessment & Plan Note (Signed)
 Refer for lung cancer screening

## 2023-04-12 NOTE — Assessment & Plan Note (Signed)
  Patient re-educated about  the importance of commitment to a  minimum of 150 minutes of exercise per week as able.  The importance of healthy food choices with portion control discussed, as well as eating regularly and within a 12 hour window most days. The need to choose "clean , green" food 50 to 75% of the time is discussed, as well as to make water the primary drink and set a goal of 64 ounces water daily.       04/09/2023   10:12 AM 03/10/2023    3:41 PM 11/04/2022    2:51 PM  Weight /BMI  Weight 184 lb 1.9 oz 184 lb 181 lb  Height 5\' 5"  (1.651 m) 5\' 5"  (1.651 m) 5\' 5"  (1.651 m)  BMI 30.64 kg/m2 30.62 kg/m2 30.12 kg/m2    unchnaged

## 2023-06-22 IMAGING — CT CT ANGIO NECK
2 of 7 series · 8 of 33 positions shown · non-contrast
Comparison: Head CT 3003 hours today.

CLINICAL DATA: 70-year-old female with left side weakness.
Neurologic deficit.



[Series 5: cta neck · axial · 0.56mm/px · z∈[+97,+155]mm · 2 of 88 slices shown]
[im 30/88  soft-tissue]
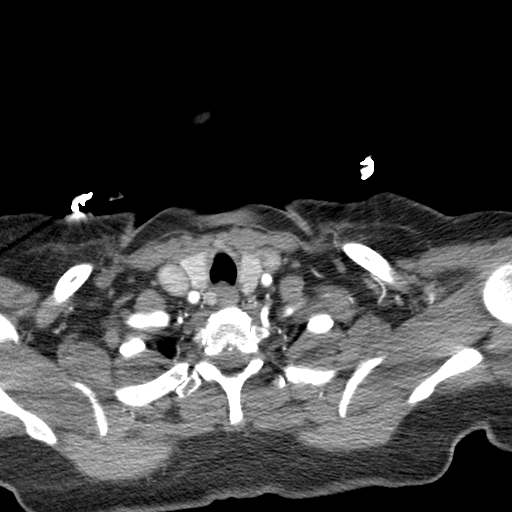
[im 59/88  soft-tissue]
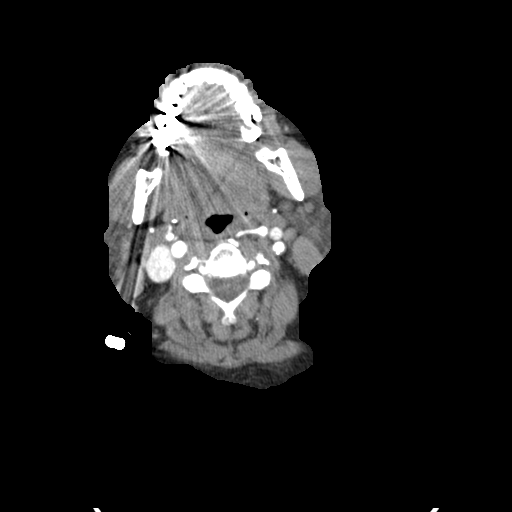

[Series 7: ax thin · axial · 0.34mm/px · z∈[+63,+188]mm · 6 of 175 slices shown]
[im 25/175  soft-tissue]
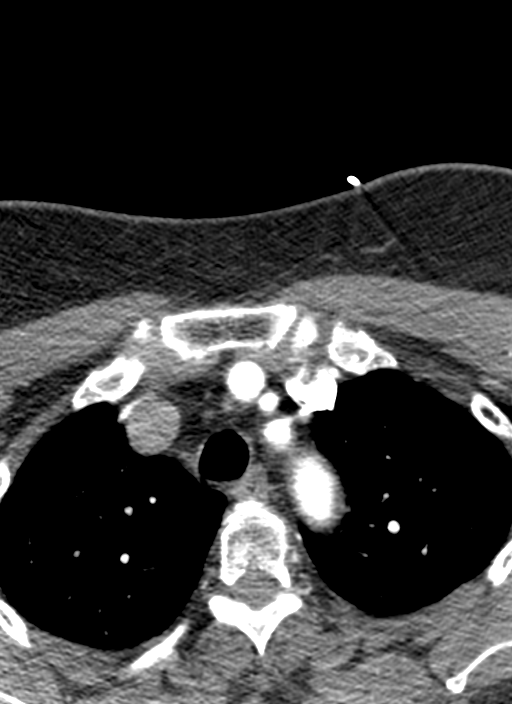
[im 50/175  bone]
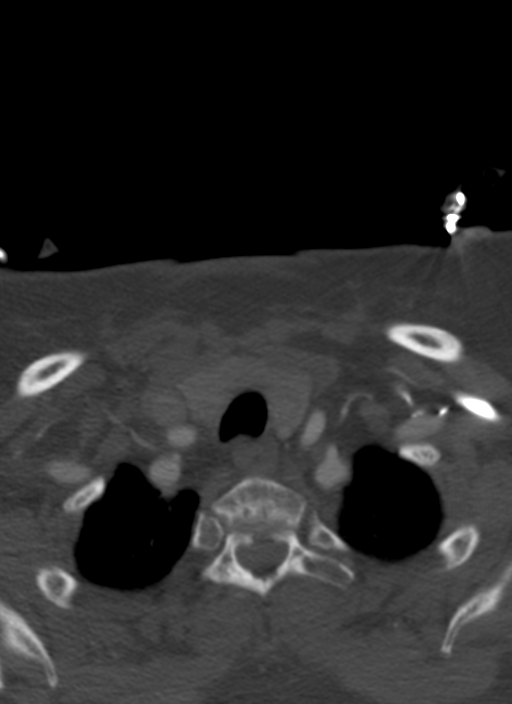
[im 75/175  soft-tissue]
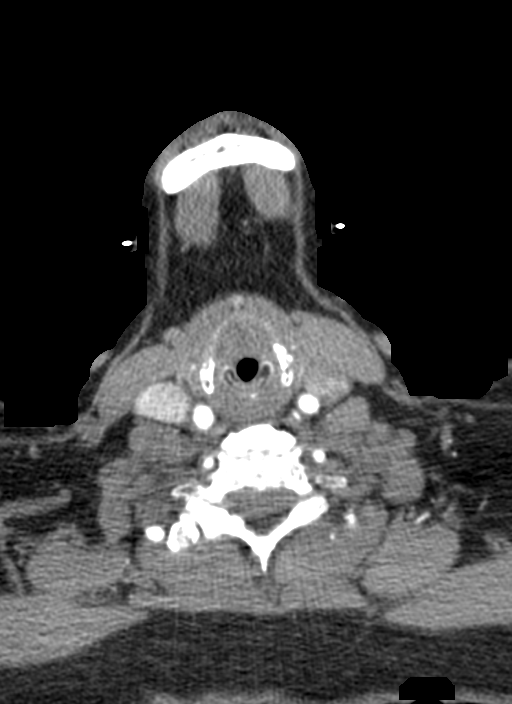
[im 100/175  bone]
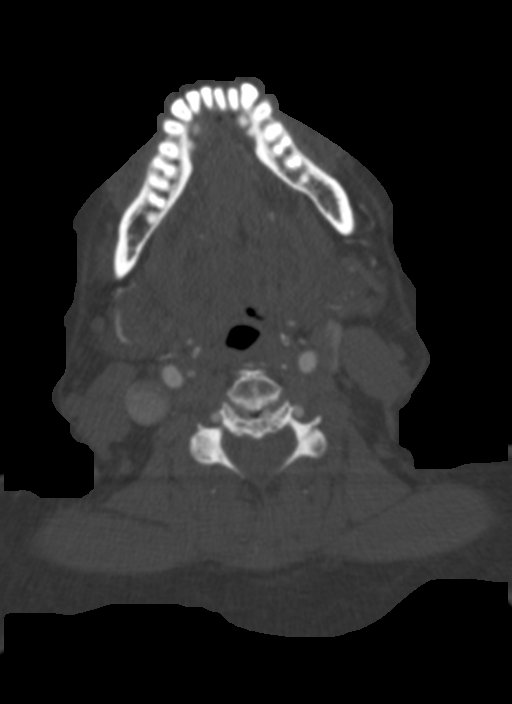
[im 125/175  soft-tissue]
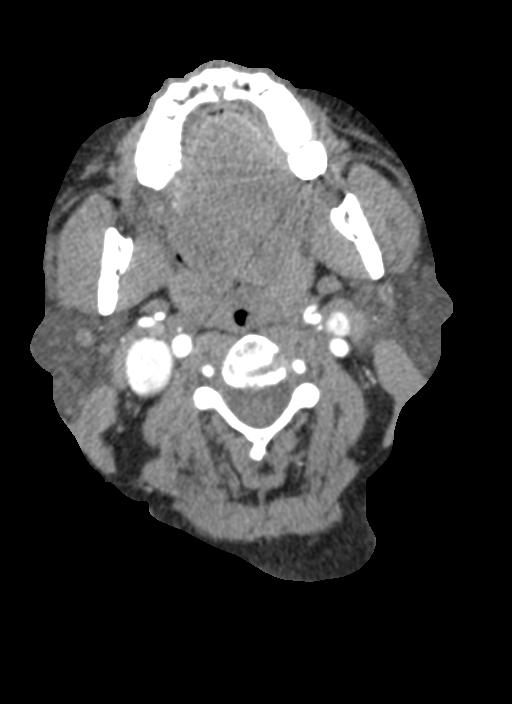
[im 150/175  bone]
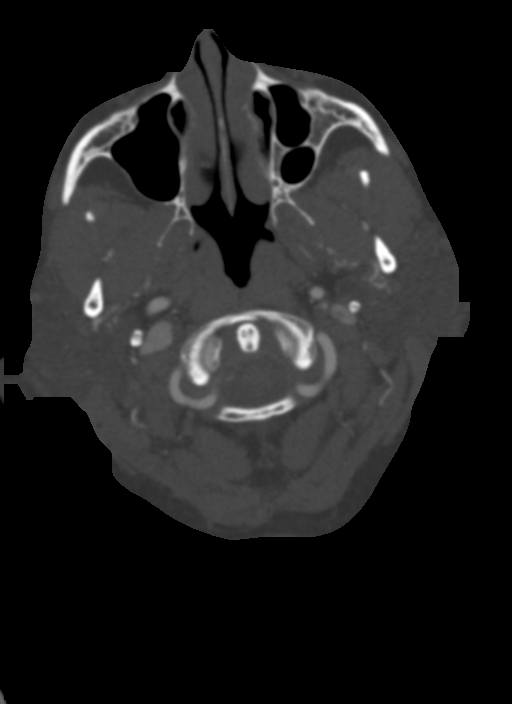

[8 of 33 positions shown; findings below may reference images not displayed]

RADIATION DOSE REDUCTION: This exam was performed according to the
departmental dose-optimization program which includes automated
exposure control, adjustment of the mA and/or kV according to
patient size and/or use of iterative reconstruction technique.

CONTRAST:  75mL OMNIPAQUE IOHEXOL 350 MG/ML SOLN
FINDINGS: Skeleton: Widespread cervical spine disc and endplate degeneration.
No acute osseous abnormality identified.

Upper chest: Negative.

Other neck: The glottis is closed. Otherwise negative neck soft
tissues.

Aortic arch: 3 vessel arch configuration with no arch
atherosclerosis. Generalized tortuosity of the proximal great
vessels.

Right carotid system: Tortuous brachiocephalic artery and right CCA
origin without plaque or stenosis. Negative right CCA and right
carotid bifurcation. Mildly tortuous right ICA below the skull base
without plaque or stenosis.

Visible right ICA siphon is patent.

Left carotid system: Mild tortuosity. Negative left CCA. Minimal
soft and calcified plaque at the left ICA bulb without stenosis.
Visible left ICA siphon is patent.

Vertebral arteries:
Tortuous proximal right subclavian artery and normal right vertebral
artery origin with no plaque or stenosis. Right vertebral artery is
tortuous in the neck but patent to the vertebrobasilar junction
without plaque or stenosis. Normal right PICA origin.

Visible proximal basilar artery is patent.

Mildly tortuous proximal left subclavian artery and normal left
vertebral artery origin. Mild V1 segment tortuosity. Codominant left
vertebral artery is patent to the vertebrobasilar junction without
plaque or stenosis.

Anatomic variants: None.

Review of the MIP images confirms the above findings
IMPRESSION: 1. Generalized arterial tortuosity with minimal atherosclerosis in
the neck. No extracranial arterial stenosis.
2. Widespread cervical spine disc and endplate degeneration.

## 2023-07-06 ENCOUNTER — Other Ambulatory Visit: Payer: Self-pay | Admitting: Family Medicine

## 2023-07-06 NOTE — Telephone Encounter (Signed)
 Copied from CRM [id number]. Topic: Clinical - Medication Refill >> Jul 06, 2023  2:36 PM Tiffany S wrote: Medication: promethazine -dextromethorphan (PROMETHAZINE -DM) 6.25-15 MG/5ML syrup [[id number]  Has the patient contacted their pharmacy? Yes (Agent: If no, request that the patient contact the pharmacy for the refill. If patient does not wish to contact the pharmacy document the reason why and proceed with request.) (Agent: If yes, when and what did the pharmacy advise?)  This is the patient's preferred pharmacy:  [id number] [place], [place] - [address] [place] [address] Phone: [phone] Fax: [phone]  Is this the correct pharmacy for this prescription? Yes If no, delete pharmacy and type the correct one.   Has the prescription been filled recently? Yes  Is the patient out of the medication? Yes  Has the patient been seen for an appointment in the last year OR does the patient have an upcoming appointment? Yes  Can we respond through MyChart? Yes  Agent: Please be advised that Rx refills may take up to 3 business days. We ask that you follow-up with your pharmacy.

## 2023-07-06 NOTE — Telephone Encounter (Signed)
 See crm update below. Calling for status update on refill   Copied from CRM [id number]. Topic: Clinical - Medication Refill >> Jul 06, 2023  2:36 PM Tiffany S wrote: Medication: promethazine -dextromethorphan (PROMETHAZINE -DM) 6.25-15 MG/5ML syrup [[id number]  Has the patient contacted their pharmacy? Yes (Agent: If no, request that the patient contact the pharmacy for the refill. If patient does not wish to contact the pharmacy document the reason why and proceed with request.) (Agent: If yes, when and what did the pharmacy advise?)  This is the patient's preferred pharmacy:  [id number] [place], [place] - [address] [place] [address] Phone: [phone] Fax: [phone]  Is this the correct pharmacy for this prescription? Yes If no, delete pharmacy and type the correct one.   Has the prescription been filled recently? Yes  Is the patient out of the medication? Yes  Has the patient been seen for an appointment in the last year OR does the patient have an upcoming appointment? Yes  Can we respond through MyChart? Yes  Agent: Please be advised that Rx refills may take up to 3 business days. We ask that you follow-up with your pharmacy. >> Jul 06, 2023  5:02 PM DeAngela L wrote: Pt called to check status of the prescription informed refills could be 3 business day

## 2023-07-06 NOTE — Telephone Encounter (Unsigned)
 Copied from CRM [id number]. Topic: Clinical - Medication Refill >> Jul 06, 2023  2:36 PM Tiffany S wrote: Medication: promethazine -dextromethorphan (PROMETHAZINE -DM) 6.25-15 MG/5ML syrup [[id number]  Has the patient contacted their pharmacy? Yes (Agent: If no, request that the patient contact the pharmacy for the refill. If patient does not wish to contact the pharmacy document the reason why and proceed with request.) (Agent: If yes, when and what did the pharmacy advise?)  This is the patient's preferred pharmacy:  [id number] [place], [place] - [address] [place] [address] Phone: [phone] Fax: [phone]  Is this the correct pharmacy for this prescription? Yes If no, delete pharmacy and type the correct one.   Has the prescription been filled recently? Yes  Is the patient out of the medication? Yes  Has the patient been seen for an appointment in the last year OR does the patient have an upcoming appointment? Yes  Can we respond through MyChart? Yes  Agent: Please be advised that Rx refills may take up to 3 business days. We ask that you follow-up with your pharmacy.

## 2023-07-07 ENCOUNTER — Telehealth: Payer: Self-pay | Admitting: Family Medicine

## 2023-07-07 NOTE — Telephone Encounter (Signed)
Pt informed

## 2023-07-07 NOTE — Telephone Encounter (Unsigned)
 Copied from CRM [id number]. Topic: Clinical - Medication Question >> Jul 07, 2023  8:11 AM Carlatta H wrote: Reason for CRM: Patient would like a zpack called into pharmacy//She thinks she may have bronchitis and there are not appointment patient desires//Please call to advise

## 2023-08-11 ENCOUNTER — Ambulatory Visit: Payer: Self-pay | Admitting: Family Medicine

## 2023-08-11 LAB — LIPID PANEL
Chol/HDL Ratio: 3.5 ratio (ref 0.0–4.4)
Cholesterol, Total: 145 mg/dL (ref 100–199)
HDL: 41 mg/dL (ref >39)
LDL Chol Calc (NIH): 75 mg/dL (ref 0–99)
Triglycerides: 172 mg/dL — ABNORMAL HIGH (ref 0–149)
VLDL Cholesterol Cal: 29 mg/dL (ref 5–40)

## 2023-08-11 LAB — CMP14+EGFR
ALT: 12 IU/L (ref 0–32)
AST: 17 IU/L (ref 0–40)
Albumin: 4 g/dL (ref 3.8–4.8)
Alkaline Phosphatase: 72 IU/L (ref 44–121)
BUN/Creatinine Ratio: 20 (ref 12–28)
BUN: 16 mg/dL (ref 8–27)
Bilirubin Total: 0.2 mg/dL (ref 0.0–1.2)
CO2: 19 mmol/L — ABNORMAL LOW (ref 20–29)
Calcium: 9.4 mg/dL (ref 8.7–10.3)
Chloride: 108 mmol/L — ABNORMAL HIGH (ref 96–106)
Creatinine, Ser: 0.8 mg/dL (ref 0.57–1.00)
Globulin, Total: 2.6 g/dL (ref 1.5–4.5)
Glucose: 102 mg/dL — ABNORMAL HIGH (ref 70–99)
Potassium: 4.3 mmol/L (ref 3.5–5.2)
Sodium: 143 mmol/L (ref 134–144)
Total Protein: 6.6 g/dL (ref 6.0–8.5)
eGFR: 78 mL/min/{1.73_m2} (ref >59)

## 2023-08-11 LAB — CBC
Hematocrit: 39.1 % (ref 34.0–46.6)
Hemoglobin: 11.9 g/dL (ref 11.1–15.9)
MCH: 26.3 pg — ABNORMAL LOW (ref 26.6–33.0)
MCHC: 30.4 g/dL — ABNORMAL LOW (ref 31.5–35.7)
MCV: 86 fL (ref 79–97)
Platelets: 328 10*3/uL (ref 150–450)
RBC: 4.53 x10E6/uL (ref 3.77–5.28)
RDW: 15.4 % (ref 11.7–15.4)
WBC: 6.9 10*3/uL (ref 3.4–10.8)

## 2023-08-11 LAB — HEMOGLOBIN A1C
Est. average glucose Bld gHb Est-mCnc: 140 mg/dL
Hgb A1c MFr Bld: 6.5 % — ABNORMAL HIGH (ref 4.8–5.6)

## 2023-08-13 ENCOUNTER — Ambulatory Visit: Payer: 59 | Admitting: Family Medicine

## 2023-08-13 ENCOUNTER — Ambulatory Visit (HOSPITAL_COMMUNITY)
Admission: RE | Admit: 2023-08-13 | Discharge: 2023-08-13 | Disposition: A | Discharge status: Home / Self Care | Source: Ambulatory Visit | Attending: Family Medicine | Admitting: Family Medicine

## 2023-08-13 ENCOUNTER — Encounter: Payer: Self-pay | Admitting: Family Medicine

## 2023-08-13 VITALS — BP 130/78 | HR 72 | Resp 16 | Ht 65.0 in | Wt 181.1 lb

## 2023-08-13 DIAGNOSIS — E785 Hyperlipidemia, unspecified: Secondary | ICD-10-CM | POA: Diagnosis not present

## 2023-08-13 DIAGNOSIS — Z1329 Encounter for screening for other suspected endocrine disorder: Secondary | ICD-10-CM

## 2023-08-13 DIAGNOSIS — E1169 Type 2 diabetes mellitus with other specified complication: Secondary | ICD-10-CM | POA: Diagnosis not present

## 2023-08-13 DIAGNOSIS — E66811 Obesity, class 1: Secondary | ICD-10-CM

## 2023-08-13 DIAGNOSIS — J3089 Other allergic rhinitis: Secondary | ICD-10-CM | POA: Diagnosis not present

## 2023-08-13 DIAGNOSIS — M25551 Pain in right hip: Secondary | ICD-10-CM | POA: Diagnosis present

## 2023-08-13 DIAGNOSIS — E8881 Metabolic syndrome: Secondary | ICD-10-CM

## 2023-08-13 DIAGNOSIS — R7303 Prediabetes: Secondary | ICD-10-CM

## 2023-08-13 DIAGNOSIS — E559 Vitamin D deficiency, unspecified: Secondary | ICD-10-CM

## 2023-08-13 DIAGNOSIS — Z87891 Personal history of nicotine dependence: Secondary | ICD-10-CM

## 2023-08-13 DIAGNOSIS — R053 Chronic cough: Secondary | ICD-10-CM

## 2023-08-13 MED ORDER — PREDNISONE 5 MG PO TABS: 5.0000 mg | ORAL_TABLET | Freq: Two times a day (BID) | ORAL | 0 refills | Status: AC

## 2023-08-13 MED ORDER — BENZONATATE 100 MG PO CAPS: 100.0000 mg | ORAL_CAPSULE | Freq: Two times a day (BID) | ORAL | 1 refills | Status: AC | PRN

## 2023-08-13 MED ORDER — PROMETHAZINE-DM 6.25-15 MG/5ML PO SYRP: ORAL_SOLUTION | ORAL | 0 refills | Status: DC

## 2023-08-13 NOTE — Patient Instructions (Addendum)
 F/u in 4 1/2 months, call if you need me sooner  X-ray of right hip today.  Fasting lipid, cmp and EGFR, hBA1C, TSH and vit D 3 to 5 days before next visit  For uncontrolled allergies prednisone  is prescribed for 3 days.  Commit to DAILY flonase  and montelukast , and claritin   Tessalon  Perles are prescribed for as needed use for cough as well as cough suppressant syrup.  Please reduce shrimp and olive oil and any additional fat that could explain the recent risse in triglycerides , otherwise your cholesterol is excellent.  Continue to work on lowering blood sugar you are doing very well and we will have repeat labs just before your next visit.  Chest scan annually for lung cancer screening is recommended  Thanks for choosing [facility], we consider it a privelige to serve you.

## 2023-08-14 DIAGNOSIS — M25551 Pain in right hip: Secondary | ICD-10-CM | POA: Insufficient documentation

## 2023-08-14 DIAGNOSIS — Z1329 Encounter for screening for other suspected endocrine disorder: Secondary | ICD-10-CM | POA: Insufficient documentation

## 2023-08-14 DIAGNOSIS — E1169 Type 2 diabetes mellitus with other specified complication: Secondary | ICD-10-CM | POA: Insufficient documentation

## 2023-08-14 NOTE — Assessment & Plan Note (Signed)
 3 occurrences of transient severe pain that caused her to stop in her tracks, less than 1 minute each, denies instability, likely from spine based on history and exam butneeds x ray

## 2023-08-14 NOTE — Assessment & Plan Note (Signed)
 Hyperlipidemia:Low fat diet discussed and encouraged.   Lipid Panel  Lab Results  Component Value Date   CHOL 145 08/10/2023   HDL 41 08/10/2023   LDLCALC 75 08/10/2023   TRIG 172 (H) 08/10/2023   CHOLHDL 3.5 08/10/2023     Needs to reduce fat intake , TG elevated

## 2023-08-14 NOTE — Assessment & Plan Note (Signed)
 The increased risk of cardiovascular disease associated with this diagnosis, and the need to consistently work on lifestyle to change this is discussed. Following  a  heart healthy diet ,commitment to 30 minutes of exercise at least 5 days per week, as well as control of blood sugar and cholesterol , and achieving a healthy weight are all the areas to be addressed .

## 2023-08-14 NOTE — Assessment & Plan Note (Signed)
 Diabetes associated with hyperlipidemia and obesity  Kristin Blackwell is reminded of the importance of commitment to daily physical activity for 30 minutes or more, as able and the need to limit carbohydrate intake to 30 to 60 grams per meal to help with blood sugar control.   Diet controlled .   Kristin Blackwell is reminded of the importance of daily foot exam, annual eye examination, and good blood sugar, blood pressure and cholesterol control.     Latest Ref Rng & Units 08/10/2023    8:18 AM 04/05/2023    8:02 AM 11/20/2022    9:40 AM 12/19/2021    9:04 AM 08/28/2021    3:44 PM  Diabetic Labs  HbA1c 4.8 - 5.6 % 6.5  6.6  6.5  6.2    Chol 100 - 199 mg/dL 854   860  834    HDL >60 mg/dL 41   45  45    Calc LDL 0 - 99 mg/dL 75   75  896    Triglycerides 0 - 149 mg/dL 827   899  94    Creatinine 0.57 - 1.00 mg/dL 9.19  9.15  9.07  9.06  0.76       08/13/2023   10:28 AM 04/09/2023   10:51 AM 04/09/2023   10:12 AM 03/10/2023    3:41 PM 11/04/2022    2:51 PM 04/29/2022    2:43 PM 04/29/2022    2:41 PM  BP/Weight  Systolic BP 130 124 134 123 132 132 143  Diastolic BP 78 78 78 75 69 73 82  Wt. (Lbs) 181.12  184.12 184 181  180.08  BMI 30.14 kg/m2  30.64 kg/m2 30.62 kg/m2 30.12 kg/m2  29.97 kg/m2       No data to display

## 2023-08-14 NOTE — Assessment & Plan Note (Signed)
 Tessalon  perles and phenergan  DM as needed SDhort course of prednisone  due to current flare

## 2023-08-14 NOTE — Progress Notes (Signed)
 Kristin Blackwell     MRN: [id number]      DOB: 23-Jun-1950  Chief Complaint  Patient presents with   Hyperlipidemia    18 week follow up    Hip Pain    Pt complains of intermittent right hip pain x3 days that gets worse in the evenings and night. States she thinks she felt a knot in her rt hip when in the shower this am     HPI Kristin Blackwell is here for follow up and re-evaluation of chronic medical conditions, medication management and review of any available recent lab and radiology data.  Preventive health is updated, specifically  Cancer screening and Immunization.   Questions or concerns regarding consultations or procedures which the PT has had in the interim are  addressed. The PT denies any adverse reactions to current medications since the last visit.  Concern as above ROS Recently treated for URI , cough and chest congestion persist, does state not using allergy meds daily as prescribed and recommended, therefor knows that  her allergies are uncontrolled and that this is a major part of her problem Declines Allergy eval until she follows current treatment plan and assesses the result. Denies chest pains, palpitations and leg swelling Denies abdominal pain, nausea, vomiting,diarrhea or constipation.   Denies dysuria, frequency, hesitancy or incontinence. C/o generalized joint pain,  denies swelling and  has had mild limitation in mobility.when she has experienced sharp pain in right hip Denies headaches, seizures, numbness, or tingling. Denies depression, anxiety or insomnia. Denies skin break down or rash.   PE  BP 130/78   Pulse 72   Resp 16   Ht 5' 5 (1.651 m)   Wt 181 lb 1.9 oz (82.2 kg)   SpO2 96%   BMI 30.14 kg/m   Patient alert and oriented and in no cardiopulmonary distress.  HEENT: No facial asymmetry, EOMI,     Neck supple .  Chest: Clear to auscultation bilaterally.  CVS: S1, S2 no murmurs, no S3.Regular rate.  ABD: Soft non tender.   Ext: No  edema  MS: Adequate ROM spine, shoulders, hips and knees.  Skin: Intact, no ulcerations or rash noted.  Psych: Good eye contact, normal affect. Memory intact not anxious or depressed appearing.  CNS: CN 2-12 intact, power,  normal throughout.no focal deficits noted.   Assessment & Plan  Allergic rhinitis Uncontrolled, needs to commit to use of prescription and over-the-counter medicine daily as prescribed and recommended.  Currently holding off on evaluation by an allergist until she follows through with plan. Daily Flonase  , montelukast  an Astelin  Current flare, short course of prednisone  for 3 days twice daily  H/O tobacco use, presenting hazards to health Needs screening still hesitant  Hyperlipidemia LDL goal <100 Hyperlipidemia:Low fat diet discussed and encouraged.   Lipid Panel  Lab Results  Component Value Date   CHOL 145 08/10/2023   HDL 41 08/10/2023   LDLCALC 75 08/10/2023   TRIG 172 (H) 08/10/2023   CHOLHDL 3.5 08/10/2023     Needs to reduce fat intake , TG elevated  Obesity (BMI 30.0-34.9)  Patient re-educated about  the importance of commitment to a  minimum of 150 minutes of exercise per week as able.  The importance of healthy food choices with portion control discussed, as well as eating regularly and within a 12 hour window most days. The need to choose clean , green food 50 to 75% of the time is discussed, as well as to  make water the primary drink and set a goal of 64 ounces water daily.       08/13/2023   10:28 AM 04/09/2023   10:12 AM 03/10/2023    3:41 PM  Weight /BMI  Weight 181 lb 1.9 oz 184 lb 1.9 oz 184 lb  Height 5' 5 (1.651 m) 5' 5 (1.651 m) 5' 5 (1.651 m)  BMI 30.14 kg/m2 30.64 kg/m2 30.62 kg/m2    Slight improvement which is good  Type 2 diabetes mellitus with other specified complication (HCC) Diabetes associated with hyperlipidemia and obesity  Kristin Blackwell is reminded of the importance of commitment to daily physical  activity for 30 minutes or more, as able and the need to limit carbohydrate intake to 30 to 60 grams per meal to help with blood sugar control.   Diet controlled .   Kristin Blackwell is reminded of the importance of daily foot exam, annual eye examination, and good blood sugar, blood pressure and cholesterol control.     Latest Ref Rng & Units 08/10/2023    8:18 AM 04/05/2023    8:02 AM 11/20/2022    9:40 AM 12/19/2021    9:04 AM 08/28/2021    3:44 PM  Diabetic Labs  HbA1c 4.8 - 5.6 % 6.5  6.6  6.5  6.2    Chol 100 - 199 mg/dL 854   860  834    HDL >60 mg/dL 41   45  45    Calc LDL 0 - 99 mg/dL 75   75  896    Triglycerides 0 - 149 mg/dL 827   899  94    Creatinine 0.57 - 1.00 mg/dL 9.19  9.15  9.07  9.06  0.76       08/13/2023   10:28 AM 04/09/2023   10:51 AM 04/09/2023   10:12 AM 03/10/2023    3:41 PM 11/04/2022    2:51 PM 04/29/2022    2:43 PM 04/29/2022    2:41 PM  BP/Weight  Systolic BP 130 124 134 123 132 132 143  Diastolic BP 78 78 78 75 69 73 82  Wt. (Lbs) 181.12  184.12 184 181  180.08  BMI 30.14 kg/m2  30.64 kg/m2 30.62 kg/m2 30.12 kg/m2  29.97 kg/m2       No data to display              Vitamin D  deficiency Updated lab needed at/ before next visit.   Cough  Tessalon  perles and phenergan  DM as needed SDhort course of prednisone  due to current flare  Pain of right hip 3 occurrences of transient severe pain that caused her to stop in her tracks, less than 1 minute each, denies instability, likely from spine based on history and exam butneeds x ray  Metabolic syndrome X The increased risk of cardiovascular disease associated with this diagnosis, and the need to consistently work on lifestyle to change this is discussed. Following  a  heart healthy diet ,commitment to 30 minutes of exercise at least 5 days per week, as well as control of blood sugar and cholesterol , and achieving a healthy weight are all the areas to be addressed .

## 2023-08-14 NOTE — Assessment & Plan Note (Signed)
  Patient re-educated about  the importance of commitment to a  minimum of 150 minutes of exercise per week as able.  The importance of healthy food choices with portion control discussed, as well as eating regularly and within a 12 hour window most days. The need to choose clean , green food 50 to 75% of the time is discussed, as well as to make water the primary drink and set a goal of 64 ounces water daily.       08/13/2023   10:28 AM 04/09/2023   10:12 AM 03/10/2023    3:41 PM  Weight /BMI  Weight 181 lb 1.9 oz 184 lb 1.9 oz 184 lb  Height 5' 5 (1.651 m) 5' 5 (1.651 m) 5' 5 (1.651 m)  BMI 30.14 kg/m2 30.64 kg/m2 30.62 kg/m2    Slight improvement which is good

## 2023-08-14 NOTE — Assessment & Plan Note (Addendum)
 Uncontrolled, needs to commit to use of prescription and over-the-counter medicine daily as prescribed and recommended.  Currently holding off on evaluation by an allergist until she follows through with plan. Daily Flonase  , montelukast  an Astelin  Current flare, short course of prednisone  for 3 days twice daily

## 2023-08-14 NOTE — Assessment & Plan Note (Signed)
 Updated lab needed at/ before next visit.

## 2023-08-14 NOTE — Assessment & Plan Note (Signed)
 Needs screening still hesitant

## 2023-08-16 NOTE — Progress Notes (Signed)
 SIMPLE VISIT

## 2023-08-22 ENCOUNTER — Ambulatory Visit: Payer: Self-pay | Admitting: Family Medicine

## 2023-11-04 ENCOUNTER — Encounter (HOSPITAL_COMMUNITY): Payer: Self-pay | Admitting: Emergency Medicine

## 2023-11-04 ENCOUNTER — Other Ambulatory Visit: Payer: Self-pay

## 2023-11-04 ENCOUNTER — Emergency Department (HOSPITAL_COMMUNITY)

## 2023-11-04 ENCOUNTER — Emergency Department (HOSPITAL_COMMUNITY)
Admission: EM | Admit: 2023-11-04 | Discharge: 2023-11-04 | Disposition: A | Discharge status: Home / Self Care | Attending: Emergency Medicine | Admitting: Emergency Medicine

## 2023-11-04 DIAGNOSIS — E119 Type 2 diabetes mellitus without complications: Secondary | ICD-10-CM | POA: Insufficient documentation

## 2023-11-04 DIAGNOSIS — W19XXXA Unspecified fall, initial encounter: Secondary | ICD-10-CM | POA: Insufficient documentation

## 2023-11-04 DIAGNOSIS — M25562 Pain in left knee: Secondary | ICD-10-CM | POA: Insufficient documentation

## 2023-11-04 DIAGNOSIS — Z7982 Long term (current) use of aspirin: Secondary | ICD-10-CM | POA: Diagnosis not present

## 2023-11-04 MED ORDER — HYDROCODONE-ACETAMINOPHEN 5-325 MG PO TABS: ORAL_TABLET | ORAL | 0 refills | Status: DC

## 2023-11-04 MED ORDER — HYDROCODONE-ACETAMINOPHEN 5-325 MG PO TABS
1.0000 | ORAL_TABLET | Freq: Once | ORAL | Status: AC
  Administered 2023-11-04: 1 via ORAL
  Filled 2023-11-04: qty 1

## 2023-11-04 NOTE — Discharge Instructions (Signed)
 Use the knee brace when walking or standing.  You may remove for bathing bedtime and when at rest.  Apply ice packs on and off to your knee to help with swelling.  You may continue to take Advil  as directed with food and I have also prescribed pain medication for you.  Do not drive or operate machinery while taking this medication.  Please call the orthopedic provider listed to arrange a follow-up appointment.  Return to the emergency department for any new or worsening symptoms.

## 2023-11-04 NOTE — ED Triage Notes (Signed)
 Pt states she fell on her knees x5 days ago and over the last few days the pain to her left knee has progressively gotten worse.

## 2023-11-04 NOTE — ED Provider Notes (Signed)
 [place] EMERGENCY DEPARTMENT AT [facility] Provider Note   CSN: [id number] Arrival date & time: 11/04/23  9096     Patient presents with: Knee Pain   Kristin Blackwell is a 73 y.o. female.    Knee Pain Associated symptoms: no back pain, no fever and no neck pain         Kristin Blackwell is a 73 y.o. female past medical history of left bundle branch block, chronic foot and shoulder pain, prior TIA, type 2 diabetes, who presents to the Emergency Department complaining of lateral left knee pain after mechanical fall.  States she was carrying a box of groceries into the house when she slipped and fell landing on the front of both knees.  She denies having any significant pain after the fall which occurred 4 days ago, but yesterday began having lateral knee pain after walking up some steps and excessive walking at her job.  She denies any swelling of the knee redness, or numbness of the extremity.  States she is having difficulty with weightbearing due to pain.  Denies other injuries or LOC   Prior to Admission medications   Medication Sig Start Date End Date Taking? Authorizing Provider  aspirin  EC 81 MG EC tablet Take 1 tablet (81 mg total) by mouth daily. Swallow whole. 03/21/21   Ricky Fines, MD  benzonatate  (TESSALON ) 100 MG capsule Take 1 capsule (100 mg total) by mouth 2 (two) times daily as needed for cough. 08/13/23   Antonetta Rollene BRAVO, MD  Cholecalciferol (VITAMIN D ) 50 MCG (2000 UT) CAPS Take 1 capsule by mouth daily.    [provider]  estradiol (ESTRACE) 0.1 MG/GM vaginal cream Place 0.5 g vaginally 2 (two) times a week. 07/27/23   [provider]  fluticasone  (FLONASE ) 50 MCG/ACT nasal spray Place 2 sprays into both nostrils daily. 11/04/22   Antonetta Rollene BRAVO, MD  loratadine  (CLARITIN ) 10 MG tablet Take one tablet by mouth twice daily for 1 week, then once daily 11/04/22   Antonetta Rollene BRAVO, MD  montelukast  (SINGULAIR ) 10 MG tablet Take  1 tablet (10 mg total) by mouth at bedtime. 11/04/22   Antonetta Rollene BRAVO, MD  Multiple Vitamins-Minerals (HAIR/SKIN/NAILS/BIOTIN PO) Take 1 capsule by mouth daily.    [provider]  promethazine -dextromethorphan (PROMETHAZINE -DM) 6.25-15 MG/5ML syrup Take one teaspoon at bedtime as needed, for excessive cough 08/13/23   Antonetta Rollene BRAVO, MD  rosuvastatin  (CRESTOR ) 20 MG tablet TAKE 1 TABLET BY MOUTH EVERY DAY 03/01/23   Antonetta Rollene BRAVO, MD  zinc gluconate 50 MG tablet Take 50 mg by mouth daily.    [provider]    Allergies: Ibandronate sodium, Lipitor [atorvastatin], and Pravastatin     Review of Systems  Constitutional:  Negative for chills and fever.  Respiratory:  Negative for shortness of breath.   Cardiovascular:  Negative for chest pain.  Gastrointestinal:  Negative for abdominal pain, nausea and vomiting.  Musculoskeletal:  Positive for arthralgias (left knee pain). Negative for back pain and neck pain.  Skin:  Negative for color change and wound.  Neurological:  Negative for weakness and numbness.    Updated Vital Signs BP (!) 140/95   Pulse 64   Temp 98.3 F (36.8 C) (Oral)   Resp 16   Ht 5' 5 (1.651 m)   Wt 81.6 kg   SpO2 99%   BMI 29.95 kg/m   Physical Exam Vitals and nursing note reviewed.  Constitutional:  General: She is not in acute distress.    Appearance: Normal appearance. She is not ill-appearing or toxic-appearing.  Cardiovascular:     Rate and Rhythm: Normal rate and regular rhythm.  Musculoskeletal:        General: Tenderness and signs of injury present. No swelling or deformity.     Cervical back: Normal range of motion.     Left knee: No swelling, effusion or erythema. Decreased range of motion. Tenderness present over the lateral joint line. No patellar tendon tenderness. Normal pulse.     Right lower leg: No edema.     Left lower leg: No edema.     Comments: Significant tenderness of the lateral left knee with  attempted range of motion.  Some posterior knee pain as well.  Do not appreciate any bony deformities, palpable effusion or obvious edema.  Compartments of the extremity are soft.  Skin:    General: Skin is warm.  Neurological:     Mental Status: She is alert.     (all labs ordered are listed, but only abnormal results are displayed) Labs Reviewed - No data to display  EKG: None  Radiology: DG Knee Complete 4 Views Left Result Date: 11/04/2023 CLINICAL DATA:  Left knee pain after fall 5 days ago. EXAM: LEFT KNEE - COMPLETE 4+ VIEW COMPARISON:  None Available. FINDINGS: No fracture or dislocation is noted. Small suprapatellar joint effusion may be present. Mild patellar spurring is noted. No significant joint space narrowing is noted. IMPRESSION: Small suprapatellar joint effusion. No fracture or dislocation. Electronically Signed   By: Lynwood Landy Raddle M.D.   On: 11/04/2023 10:21     Procedures   Medications Ordered in the ED  HYDROcodone -acetaminophen  (NORCO/VICODIN) 5-325 MG per tablet 1 tablet (has no administration in time range)                                    Medical Decision Making Patient here for evaluation of left knee pain after mechanical fall that occurred 4 days ago.  She did not have significant pain of the knee immediately after the fall but pain began yesterday after excessive walking and going up and down some steps.  She has difficulty with flexion of the knee and pain with weightbearing.  Denies any other injuries numbness or swelling of the knee or leg.  Differential would include but not limited to sprain, fracture, dislocation, ligamentous injury.  No erythema, edema or excessive warmth to suggest a septic joint.  Amount and/or Complexity of Data Reviewed Radiology: ordered.    Details: X-ray left knee shows small suprapatellar joint effusion no fracture or dislocation. Discussion of management or test interpretation with external provider(s): Patient  with left knee pain suspect meniscal injury.  No palpable effusion on exam.  She appears appropriate for discharge home, short course of pain medication will be provided.  Database was reviewed.  She is agreeable to continue with RICE therapy, knee brace applied and she has her own crutches.  She is agreeable to close outpatient orthopedic follow-up.  Return precautions were given  Risk Prescription drug management.        Final diagnoses:  Acute pain of left knee    ED Discharge Orders     None          Herlinda Milling, PA-C 11/04/23 1047    Cleotilde Rogue, MD 11/05/23 [phone]

## 2023-11-09 ENCOUNTER — Ambulatory Visit: Admitting: Orthopedic Surgery

## 2023-11-09 ENCOUNTER — Encounter: Payer: Self-pay | Admitting: Orthopedic Surgery

## 2023-11-09 VITALS — BP 140/72 | HR 65 | Ht 65.0 in | Wt 180.0 lb

## 2023-11-09 DIAGNOSIS — M25562 Pain in left knee: Secondary | ICD-10-CM

## 2023-11-09 NOTE — Patient Instructions (Signed)

## 2023-11-09 NOTE — Progress Notes (Signed)
 New Patient Visit  Assessment: Kristin Blackwell is a 73 y.o. female with the following: 1. Acute pain of left knee  Plan: [name] had acute onset of pain about a week ago.  She did sustain a fall, but did not have pain in the knee for a couple days after the fall.  She reports that her symptoms are getting better.  Radiographs demonstrates some mild degenerative changes.  I have recommended that she start working on gentle range of motion, to avoid stiffness.  She states understanding.  Transition off of the crutches.  Continue with ice, Tylenol  and ibuprofen  as needed.  Briefly discussed an injection, but she is not interested at this time.  If she changes her mind, she will contact the clinic.  Follow-up: Return if symptoms worsen or fail to improve.  Subjective:  Chief Complaint  Patient presents with   Knee Pain    L after a fall pt states pain was at it's worse 1 wk ago and states she left work and could barely walk. Pt states she has started to feel better after going to the Kristin Blackwell 11/04/23    History of Present Illness: [name] is a 73 y.o. female who presents for evaluation of left knee pain.  She states that she fell a little over a week ago, but did not have any pain.  A few days later, she started to have a little bit of discomfort.  Progressively worsened, to the point that she had excruciating pain.  She presented to the emergency department.  Radiographs were negative.  She was given a brace.  She has been using crutches.  She was taking Vicodin, but over the past 48 hours, her pain has gotten much better.  She is also taking Tylenol  and occasionally some ibuprofen .   Review of Systems: No fevers or chills No numbness or tingling No chest pain No shortness of breath No bowel or bladder dysfunction No GI distress No headaches   Medical History:  Past Medical History:  Diagnosis Date   Hyperlipidemia    Nicotine  addiction    Overweight(278.02)      Past Surgical History:  Procedure Laterality Date   PARTIAL HYSTERECTOMY  1987   had one ovary removed     Family History  Problem Relation Age of Onset   Hypertension Mother    Emphysema Father    Hypertension Sister    Diabetes Brother        oldest    Social History   Tobacco Use   Smoking status: Former    Current packs/day: 0.00    Average packs/day: 1 pack/day for 30.0 years (30.0 ttl pk-yrs)    Types: Cigarettes    Start date: 23    Quit date: 2014    Years since quitting: 11.7   Smokeless tobacco: Never  Vaping Use   Vaping status: Never Used  Substance Use Topics   Alcohol use: Yes    Comment: occassionally   Drug use: No    Allergies  Allergen Reactions   Ibandronate Sodium    Lipitor [Atorvastatin]     Muscle aches   Pravastatin      Muscle aches    Current Meds  Medication Sig   aspirin  EC 81 MG EC tablet Take 1 tablet (81 mg total) by mouth daily. Swallow whole.   benzonatate  (TESSALON ) 100 MG capsule Take 1 capsule (100 mg total) by mouth 2 (two) times daily as needed for cough.  Cholecalciferol (VITAMIN D ) 50 MCG (2000 UT) CAPS Take 1 capsule by mouth daily.   estradiol (ESTRACE) 0.1 MG/GM vaginal cream Place 0.5 g vaginally 2 (two) times a week.   fluticasone  (FLONASE ) 50 MCG/ACT nasal spray Place 2 sprays into both nostrils daily.   HYDROcodone -acetaminophen  (NORCO/VICODIN) 5-325 MG tablet Take one tab po q 4 hrs prn pain   loratadine  (CLARITIN ) 10 MG tablet Take one tablet by mouth twice daily for 1 week, then once daily   montelukast  (SINGULAIR ) 10 MG tablet Take 1 tablet (10 mg total) by mouth at bedtime.   Multiple Vitamins-Minerals (HAIR/SKIN/NAILS/BIOTIN PO) Take 1 capsule by mouth daily.   promethazine -dextromethorphan (PROMETHAZINE -DM) 6.25-15 MG/5ML syrup Take one teaspoon at bedtime as needed, for excessive cough   rosuvastatin  (CRESTOR ) 20 MG tablet TAKE 1 TABLET BY MOUTH EVERY DAY   zinc gluconate 50 MG tablet Take 50 mg by  mouth daily.    Objective: BP (!) 140/72   Pulse 65   Ht 5' 5 (1.651 m)   Wt 180 lb (81.6 kg)   BMI 29.95 kg/m   Physical Exam:  General: Alert and oriented. and No acute distress. Gait: Ambulates with crutches  Left knee without swelling.  No bruising.  She is lacking some range of motion due to pain.  Tenderness to palpation over the lateral joint line, in the area of the fibular head.  No tenderness medial.  Negative Lachman.  No increased laxity varus or valgus stress.    IMAGING: I personally reviewed images previously obtained from the ED  X-rays from the emergency department were available in clinic today.  No acute injuries.  Mild degenerative changes.  Small osteophytes.   New Medications:  No orders of the defined types were placed in this encounter.     Oneil DELENA Horde, MD  11/09/2023 10:27 AM

## 2023-12-25 LAB — VITAMIN D 25 HYDROXY (VIT D DEFICIENCY, FRACTURES): Vit D, 25-Hydroxy: 43.1 ng/mL (ref 30.0–100.0)

## 2023-12-25 LAB — CMP14+EGFR
ALT: 12 IU/L (ref 0–32)
AST: 18 IU/L (ref 0–40)
Albumin: 4.1 g/dL (ref 3.8–4.8)
Alkaline Phosphatase: 65 IU/L (ref 49–135)
BUN/Creatinine Ratio: 16 (ref 12–28)
BUN: 13 mg/dL (ref 8–27)
Bilirubin Total: 0.3 mg/dL (ref 0.0–1.2)
CO2: 23 mmol/L (ref 20–29)
Calcium: 9.4 mg/dL (ref 8.7–10.3)
Chloride: 107 mmol/L — ABNORMAL HIGH (ref 96–106)
Creatinine, Ser: 0.83 mg/dL (ref 0.57–1.00)
Globulin, Total: 2.8 g/dL (ref 1.5–4.5)
Glucose: 86 mg/dL (ref 70–99)
Potassium: 4.3 mmol/L (ref 3.5–5.2)
Sodium: 143 mmol/L (ref 134–144)
Total Protein: 6.9 g/dL (ref 6.0–8.5)
eGFR: 75 mL/min/1.73

## 2023-12-25 LAB — LIPID PANEL
Chol/HDL Ratio: 2.7 ratio (ref 0.0–4.4)
Cholesterol, Total: 128 mg/dL (ref 100–199)
HDL: 47 mg/dL (ref >39)
LDL Chol Calc (NIH): 65 mg/dL (ref 0–99)
Triglycerides: 80 mg/dL (ref 0–149)
VLDL Cholesterol Cal: 16 mg/dL (ref 5–40)

## 2023-12-25 LAB — TSH: TSH: 1.66 u[IU]/mL (ref 0.450–4.500)

## 2023-12-25 LAB — HEMOGLOBIN A1C
Est. average glucose Bld gHb Est-mCnc: 143 mg/dL
Hgb A1c MFr Bld: 6.6 % — ABNORMAL HIGH (ref 4.8–5.6)

## 2023-12-31 ENCOUNTER — Encounter: Payer: Self-pay | Admitting: Family Medicine

## 2023-12-31 ENCOUNTER — Ambulatory Visit: Admitting: Family Medicine

## 2023-12-31 VITALS — BP 125/62 | HR 63 | Resp 18 | Ht 65.0 in | Wt 183.0 lb

## 2023-12-31 DIAGNOSIS — E66811 Obesity, class 1: Secondary | ICD-10-CM

## 2023-12-31 DIAGNOSIS — Z23 Encounter for immunization: Secondary | ICD-10-CM | POA: Diagnosis not present

## 2023-12-31 DIAGNOSIS — E1169 Type 2 diabetes mellitus with other specified complication: Secondary | ICD-10-CM | POA: Diagnosis not present

## 2023-12-31 DIAGNOSIS — E785 Hyperlipidemia, unspecified: Secondary | ICD-10-CM | POA: Diagnosis not present

## 2023-12-31 NOTE — Patient Instructions (Addendum)
 F/U in 16 to 18 weeks   Please stop all snacks, eat NATURAL foods , vegetables and fruit, reduce sugar and white starchy foods , drink water only, please do not drink calories  It is important that you exercise regularly at least 30 minutes 5 times a week. If you develop chest pain, have severe difficulty breathing, or feel very tired, stop exercising immediately and seek medical attention     Nurse pls send for eye exam [facility] in [place] done Sept 2025  Urine ACR today  Foot exam today is good   HBA1C, bmp and eGFr non fasting 1 week before next appointment  Pls work on changes in habits that we talked about  Condolence re your recent loss  Thanks for choosing [facility], we consider it a privelige to serve you.

## 2024-01-02 ENCOUNTER — Ambulatory Visit: Payer: Self-pay | Admitting: Family Medicine

## 2024-01-02 LAB — MICROALBUMIN / CREATININE URINE RATIO
Creatinine, Urine: 151.7 mg/dL
Microalb/Creat Ratio: <2 mg/g{creat} (ref 0–29)
Microalbumin, Urine: <3 ug/mL

## 2024-01-03 NOTE — Progress Notes (Signed)
 Kristin Blackwell     MRN: [id number]      DOB: November 28, 1950  Chief Complaint  Patient presents with   Hyperlipidemia    4.5 month follow up     HPI Kristin Blackwell is here for follow up and re-evaluation of chronic medical conditions, medication management and review of any available recent lab and radiology data.  Preventive health is updated, specifically  Cancer screening and Immunization.   Questions or concerns regarding consultations or procedures which the PT has had in the interim are  addressed. The PT denies any adverse reactions to current medications since the last visit.  There are no new concerns.  There are no specific complaints   ROS Denies recent fever or chills. Denies sinus pressure, nasal congestion, ear pain or sore throat. Denies chest congestion, productive cough or wheezing. Denies chest pains, palpitations and leg swelling Denies abdominal pain, nausea, vomiting,diarrhea or constipation.   Denies dysuria, frequency, hesitancy or incontinence. Denies joint pain, swelling and limitation in mobility. Denies headaches, seizures, numbness, or tingling. Denies depression, anxiety or insomnia. Denies skin break down or rash.   PE  BP 125/62   Pulse 63   Resp 18   Ht 5' 5 (1.651 m)   Wt 183 lb 0.6 oz (83 kg)   SpO2 95%   BMI 30.46 kg/m   Patient alert and oriented and in no cardiopulmonary distress.  HEENT: No facial asymmetry, EOMI,     Neck supple .  Chest: Clear to auscultation bilaterally.  CVS: S1, S2 no murmurs, no S3.Regular rate.  ABD: Soft non tender.   Ext: No edema  MS: Adequate ROM spine, shoulders, hips and knees.  Skin: Intact, no ulcerations or rash noted.  Psych: Good eye contact, normal affect. Memory intact not anxious or depressed appearing.  CNS: CN 2-12 intact, power,  normal throughout.no focal deficits noted.   Assessment & Plan  Type 2 diabetes mellitus with other specified complication (HCC) Diabetes associated  with hyperlipidemia and obesity  Kristin Blackwell is reminded of the importance of commitment to daily physical activity for 30 minutes or more, as able and the need to limit carbohydrate intake to 30 to 60 grams per meal to help with blood sugar control.   .   Kristin Blackwell is reminded of the importance of daily foot exam, annual eye examination, and good blood sugar, blood pressure and cholesterol control.     Latest Ref Rng & Units 12/31/2023   11:26 AM 12/24/2023    9:33 AM 08/10/2023    8:18 AM 04/05/2023    8:02 AM 11/20/2022    9:40 AM  Diabetic Labs  HbA1c 4.8 - 5.6 %  6.6  6.5  6.6  6.5   Micro/Creat Ratio 0 - 29 mg/g creat <2       Chol 100 - 199 mg/dL  871  854   860   HDL >60 mg/dL  47  41   45   Calc LDL 0 - 99 mg/dL  65  75   75   Triglycerides 0 - 149 mg/dL  80  827   899   Creatinine 0.57 - 1.00 mg/dL  9.16  9.19  9.15  9.07       12/31/2023   10:51 AM 11/09/2023    9:50 AM 11/04/2023   11:00 AM 11/04/2023   10:45 AM 11/04/2023   10:30 AM 11/04/2023   10:15 AM 11/04/2023   10:00 AM  BP/Weight  Systolic BP  125 140 125 112 127 117 138  Diastolic BP 62 72 71 68 61 72 78  Wt. (Lbs) 183.04 180       BMI 30.46 kg/m2 29.95 kg/m2            No data to display              Hyperlipidemia LDL goal <100 Hyperlipidemia:Low fat diet discussed and encouraged.   Lipid Panel  Lab Results  Component Value Date   CHOL 128 12/24/2023   HDL 47 12/24/2023   LDLCALC 65 12/24/2023   TRIG 80 12/24/2023   CHOLHDL 2.7 12/24/2023     Controlled, no change in medication   Obesity (BMI 30.0-34.9)  Patient re-educated about  the importance of commitment to a  minimum of 150 minutes of exercise per week as able.  The importance of healthy food choices with portion control discussed, as well as eating regularly and within a 12 hour window most days. The need to choose clean , green food 50 to 75% of the time is discussed, as well as to make water the primary drink and set  a goal of 64 ounces water daily.       12/31/2023   10:51 AM 11/09/2023    9:50 AM 11/04/2023    9:23 AM  Weight /BMI  Weight 183 lb 0.6 oz 180 lb 180 lb  Height 5' 5 (1.651 m) 5' 5 (1.651 m) 5' 5 (1.651 m)  BMI 30.46 kg/m2 29.95 kg/m2 29.95 kg/m2      Influenza vaccination administered at current visit After obtaining informed consent, the influenza  vaccine is  administered , with no adverse effect noted at the time of administration.

## 2024-01-03 NOTE — Assessment & Plan Note (Signed)
 Hyperlipidemia:Low fat diet discussed and encouraged.   Lipid Panel  Lab Results  Component Value Date   CHOL 128 12/24/2023   HDL 47 12/24/2023   LDLCALC 65 12/24/2023   TRIG 80 12/24/2023   CHOLHDL 2.7 12/24/2023     Controlled, no change in medication

## 2024-01-03 NOTE — Assessment & Plan Note (Signed)
 Diabetes associated with hyperlipidemia and obesity  Kristin Blackwell is reminded of the importance of commitment to daily physical activity for 30 minutes or more, as able and the need to limit carbohydrate intake to 30 to 60 grams per meal to help with blood sugar control.   .   Kristin Blackwell is reminded of the importance of daily foot exam, annual eye examination, and good blood sugar, blood pressure and cholesterol control.     Latest Ref Rng & Units 12/31/2023   11:26 AM 12/24/2023    9:33 AM 08/10/2023    8:18 AM 04/05/2023    8:02 AM 11/20/2022    9:40 AM  Diabetic Labs  HbA1c 4.8 - 5.6 %  6.6  6.5  6.6  6.5   Micro/Creat Ratio 0 - 29 mg/g creat <2       Chol 100 - 199 mg/dL  871  854   860   HDL >60 mg/dL  47  41   45   Calc LDL 0 - 99 mg/dL  65  75   75   Triglycerides 0 - 149 mg/dL  80  827   899   Creatinine 0.57 - 1.00 mg/dL  9.16  9.19  9.15  9.07       12/31/2023   10:51 AM 11/09/2023    9:50 AM 11/04/2023   11:00 AM 11/04/2023   10:45 AM 11/04/2023   10:30 AM 11/04/2023   10:15 AM 11/04/2023   10:00 AM  BP/Weight  Systolic BP 125 140 125 112 127 117 138  Diastolic BP 62 72 71 68 61 72 78  Wt. (Lbs) 183.04 180       BMI 30.46 kg/m2 29.95 kg/m2            No data to display

## 2024-01-10 ENCOUNTER — Encounter: Payer: Self-pay | Admitting: Family Medicine

## 2024-01-10 DIAGNOSIS — Z23 Encounter for immunization: Secondary | ICD-10-CM | POA: Insufficient documentation

## 2024-01-10 NOTE — Assessment & Plan Note (Signed)
 After obtaining informed consent, the influenza vaccine is  administered , with no adverse effect noted at the time of administration.

## 2024-01-10 NOTE — Assessment & Plan Note (Signed)
  Patient re-educated about  the importance of commitment to a  minimum of 150 minutes of exercise per week as able.  The importance of healthy food choices with portion control discussed, as well as eating regularly and within a 12 hour window most days. The need to choose clean , green food 50 to 75% of the time is discussed, as well as to make water the primary drink and set a goal of 64 ounces water daily.       12/31/2023   10:51 AM 11/09/2023    9:50 AM 11/04/2023    9:23 AM  Weight /BMI  Weight 183 lb 0.6 oz 180 lb 180 lb  Height 5' 5 (1.651 m) 5' 5 (1.651 m) 5' 5 (1.651 m)  BMI 30.46 kg/m2 29.95 kg/m2 29.95 kg/m2

## 2024-01-28 ENCOUNTER — Other Ambulatory Visit: Payer: Self-pay | Admitting: Family Medicine

## 2024-01-29 ENCOUNTER — Other Ambulatory Visit: Payer: Self-pay | Admitting: Family Medicine

## 2024-04-21 ENCOUNTER — Ambulatory Visit: Admitting: Family Medicine
